# Patient Record
Sex: Female | Born: 1940
Health system: Southern US, Community
[De-identification: ages and names within clinical notes are randomized; demographics above are authoritative.]

## PROBLEM LIST (undated history)

## (undated) DIAGNOSIS — M8580 Other specified disorders of bone density and structure, unspecified site: Secondary | ICD-10-CM

## (undated) DIAGNOSIS — D239 Other benign neoplasm of skin, unspecified: Secondary | ICD-10-CM

## (undated) DIAGNOSIS — E039 Hypothyroidism, unspecified: Secondary | ICD-10-CM

## (undated) DIAGNOSIS — K635 Polyp of colon: Secondary | ICD-10-CM

## (undated) HISTORY — DX: Other specified disorders of bone density and structure, unspecified site: M85.80

## (undated) HISTORY — PX: OVARIAN CYST REMOVAL: SHX89

## (undated) HISTORY — DX: Other benign neoplasm of skin, unspecified: D23.9

## (undated) HISTORY — DX: Polyp of colon: K63.5

## (undated) HISTORY — DX: Hypothyroidism, unspecified: E03.9

---
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
## 1970-05-22 HISTORY — PX: ABDOMINAL HYSTERECTOMY: SHX81

## 1993-05-22 HISTORY — PX: CHOLECYSTECTOMY: SHX55

## 1997-11-12 ENCOUNTER — Ambulatory Visit (HOSPITAL_COMMUNITY): Admission: RE | Admit: 1997-11-12 | Discharge: 1997-11-12 | Payer: Self-pay | Admitting: Obstetrics and Gynecology

## 1998-08-17 ENCOUNTER — Inpatient Hospital Stay (HOSPITAL_COMMUNITY): Admission: AD | Admit: 1998-08-17 | Discharge: 1998-08-23 | Payer: Self-pay | Admitting: *Deleted

## 1999-01-28 ENCOUNTER — Ambulatory Visit (HOSPITAL_COMMUNITY): Admission: RE | Admit: 1999-01-28 | Discharge: 1999-01-28 | Payer: Self-pay | Admitting: Obstetrics and Gynecology

## 1999-01-28 ENCOUNTER — Encounter: Payer: Self-pay | Admitting: Obstetrics and Gynecology

## 1999-12-15 ENCOUNTER — Emergency Department (HOSPITAL_COMMUNITY): Admission: EM | Admit: 1999-12-15 | Discharge: 1999-12-15 | Payer: Self-pay | Admitting: Emergency Medicine

## 1999-12-15 ENCOUNTER — Encounter: Payer: Self-pay | Admitting: Gastroenterology

## 1999-12-15 ENCOUNTER — Encounter: Payer: Self-pay | Admitting: Emergency Medicine

## 1999-12-27 ENCOUNTER — Encounter: Payer: Self-pay | Admitting: Gastroenterology

## 1999-12-27 ENCOUNTER — Encounter: Admission: RE | Admit: 1999-12-27 | Discharge: 1999-12-27 | Payer: Self-pay | Admitting: Gastroenterology

## 2000-01-03 ENCOUNTER — Encounter: Admission: RE | Admit: 2000-01-03 | Discharge: 2000-01-03 | Payer: Self-pay | Admitting: Gastroenterology

## 2000-01-03 ENCOUNTER — Encounter: Payer: Self-pay | Admitting: Gastroenterology

## 2000-02-08 ENCOUNTER — Other Ambulatory Visit: Admission: RE | Admit: 2000-02-08 | Discharge: 2000-02-08 | Payer: Self-pay | Admitting: Obstetrics and Gynecology

## 2000-02-14 ENCOUNTER — Ambulatory Visit (HOSPITAL_COMMUNITY): Admission: RE | Admit: 2000-02-14 | Discharge: 2000-02-14 | Payer: Self-pay | Admitting: Gastroenterology

## 2000-03-22 ENCOUNTER — Encounter: Payer: Self-pay | Admitting: Obstetrics and Gynecology

## 2000-03-22 ENCOUNTER — Ambulatory Visit (HOSPITAL_COMMUNITY): Admission: RE | Admit: 2000-03-22 | Discharge: 2000-03-22 | Payer: Self-pay | Admitting: Obstetrics and Gynecology

## 2001-03-19 ENCOUNTER — Emergency Department (HOSPITAL_COMMUNITY): Admission: EM | Admit: 2001-03-19 | Discharge: 2001-03-19 | Payer: Self-pay

## 2004-03-24 ENCOUNTER — Ambulatory Visit: Payer: Self-pay | Admitting: Family Medicine

## 2004-06-17 ENCOUNTER — Ambulatory Visit: Payer: Self-pay | Admitting: Family Medicine

## 2005-03-21 ENCOUNTER — Ambulatory Visit: Payer: Self-pay | Admitting: Family Medicine

## 2005-03-28 ENCOUNTER — Ambulatory Visit: Payer: Self-pay | Admitting: Family Medicine

## 2006-05-17 ENCOUNTER — Ambulatory Visit: Payer: Self-pay | Admitting: Family Medicine

## 2006-05-17 LAB — CONVERTED CEMR LAB
ALT: 11 units/L (ref 0–40)
AST: 17 units/L (ref 0–37)
Basophils Relative: 1 % (ref 0.0–1.0)
Calcium: 8.9 mg/dL (ref 8.4–10.5)
Creatinine, Ser: 1 mg/dL (ref 0.4–1.2)
HDL: 63.9 mg/dL (ref >39.0)
LDL Cholesterol: 116 mg/dL — ABNORMAL HIGH (ref 0–99)
Monocytes Relative: 9.4 % (ref 3.0–11.0)
Platelets: 207 10*3/uL (ref 150–400)
RDW: 13.1 % (ref 11.5–14.6)
Triglyceride fasting, serum: 55 mg/dL (ref 0–149)
VLDL: 11 mg/dL (ref 0–40)

## 2007-06-04 ENCOUNTER — Ambulatory Visit: Payer: Self-pay | Admitting: Family Medicine

## 2007-06-04 DIAGNOSIS — E0590 Thyrotoxicosis, unspecified without thyrotoxic crisis or storm: Secondary | ICD-10-CM | POA: Insufficient documentation

## 2007-06-04 DIAGNOSIS — Z780 Asymptomatic menopausal state: Secondary | ICD-10-CM | POA: Insufficient documentation

## 2007-06-04 DIAGNOSIS — G43009 Migraine without aura, not intractable, without status migrainosus: Secondary | ICD-10-CM | POA: Insufficient documentation

## 2007-06-04 DIAGNOSIS — N951 Menopausal and female climacteric states: Secondary | ICD-10-CM | POA: Insufficient documentation

## 2007-06-04 LAB — CONVERTED CEMR LAB
AST: 19 units/L (ref 0–37)
Albumin: 3.8 g/dL (ref 3.5–5.2)
Alkaline Phosphatase: 38 units/L — ABNORMAL LOW (ref 39–117)
BUN: 10 mg/dL (ref 6–23)
Basophils Absolute: 0 10*3/uL (ref 0.0–0.1)
Basophils Relative: 0.7 % (ref 0.0–1.0)
Bilirubin, Direct: 0.1 mg/dL (ref 0.0–0.3)
Blood in Urine, dipstick: NEGATIVE
CO2: 32 meq/L (ref 19–32)
Calcium: 9.8 mg/dL (ref 8.4–10.5)
GFR calc Af Amer: 71 mL/min
GFR calc non Af Amer: 59 mL/min
HDL: 52.2 mg/dL (ref >39.0)
Ketones, urine, test strip: NEGATIVE
LDL Cholesterol: 108 mg/dL — ABNORMAL HIGH (ref 0–99)
Lymphocytes Relative: 30.6 % (ref 12.0–46.0)
MCHC: 35 g/dL (ref 30.0–36.0)
Monocytes Relative: 6.8 % (ref 3.0–11.0)
Neutro Abs: 4 10*3/uL (ref 1.4–7.7)
Nitrite: NEGATIVE
Platelets: 217 10*3/uL (ref 150–400)
Specific Gravity, Urine: 1.015
TSH: 5.14 microintl units/mL (ref 0.35–5.50)
Total Bilirubin: 0.8 mg/dL (ref 0.3–1.2)
Total Protein: 6.5 g/dL (ref 6.0–8.3)
Triglycerides: 77 mg/dL (ref 0–149)
Urobilinogen, UA: 0.2
VLDL: 15 mg/dL (ref 0–40)

## 2007-06-21 ENCOUNTER — Ambulatory Visit: Payer: Self-pay | Admitting: Family Medicine

## 2007-06-24 DIAGNOSIS — I781 Nevus, non-neoplastic: Secondary | ICD-10-CM | POA: Insufficient documentation

## 2007-07-16 ENCOUNTER — Encounter: Admission: RE | Admit: 2007-07-16 | Discharge: 2007-07-16 | Payer: Self-pay | Admitting: Family Medicine

## 2008-02-14 ENCOUNTER — Ambulatory Visit: Payer: Self-pay | Admitting: Family Medicine

## 2008-02-14 DIAGNOSIS — R141 Gas pain: Secondary | ICD-10-CM | POA: Insufficient documentation

## 2008-02-14 DIAGNOSIS — R142 Eructation: Secondary | ICD-10-CM

## 2008-02-14 DIAGNOSIS — R143 Flatulence: Secondary | ICD-10-CM

## 2008-02-15 ENCOUNTER — Encounter: Payer: Self-pay | Admitting: Family Medicine

## 2008-02-15 LAB — CONVERTED CEMR LAB
BUN: 12 mg/dL (ref 6–23)
Calcium: 10.1 mg/dL (ref 8.4–10.5)
Glucose, Bld: 88 mg/dL (ref 70–99)
Potassium: 4.5 meq/L (ref 3.5–5.3)
Sodium: 143 meq/L (ref 135–145)

## 2008-02-18 ENCOUNTER — Ambulatory Visit: Payer: Self-pay | Admitting: Cardiology

## 2008-02-20 ENCOUNTER — Ambulatory Visit: Payer: Self-pay | Admitting: Family Medicine

## 2008-06-11 ENCOUNTER — Ambulatory Visit: Payer: Self-pay | Admitting: Family Medicine

## 2008-06-12 ENCOUNTER — Encounter: Payer: Self-pay | Admitting: Family Medicine

## 2008-06-12 LAB — CONVERTED CEMR LAB
Alkaline Phosphatase: 41 units/L (ref 39–117)
Basophils Absolute: 0.1 10*3/uL (ref 0.0–0.1)
Bilirubin, Direct: 0.1 mg/dL (ref 0.0–0.3)
Calcium: 9.7 mg/dL (ref 8.4–10.5)
Cholesterol: 208 mg/dL (ref 0–200)
Eosinophils Absolute: 0.1 10*3/uL (ref 0.0–0.7)
GFR calc Af Amer: 107 mL/min
GFR calc non Af Amer: 88 mL/min
HCT: 37.3 % (ref 36.0–46.0)
Hemoglobin: 12.6 g/dL (ref 12.0–15.0)
MCHC: 33.9 g/dL (ref 30.0–36.0)
Monocytes Absolute: 0.5 10*3/uL (ref 0.1–1.0)
Monocytes Relative: 9.2 % (ref 3.0–12.0)
Neutro Abs: 2.4 10*3/uL (ref 1.4–7.7)
Platelets: 183 10*3/uL (ref 150–400)
Potassium: 3.7 meq/L (ref 3.5–5.1)
RDW: 13.2 % (ref 11.5–14.6)
Sodium: 141 meq/L (ref 135–145)
TSH: 7.73 microintl units/mL — ABNORMAL HIGH (ref 0.35–5.50)
Total Bilirubin: 0.8 mg/dL (ref 0.3–1.2)
Total CHOL/HDL Ratio: 3.7
Total Protein: 7.1 g/dL (ref 6.0–8.3)
Triglycerides: 93 mg/dL (ref 0–149)

## 2008-06-15 ENCOUNTER — Ambulatory Visit: Payer: Self-pay | Admitting: Family Medicine

## 2008-06-29 DIAGNOSIS — L820 Inflamed seborrheic keratosis: Secondary | ICD-10-CM | POA: Insufficient documentation

## 2008-06-29 DIAGNOSIS — L819 Disorder of pigmentation, unspecified: Secondary | ICD-10-CM | POA: Insufficient documentation

## 2008-07-16 ENCOUNTER — Encounter: Admission: RE | Admit: 2008-07-16 | Discharge: 2008-07-16 | Payer: Self-pay | Admitting: Family Medicine

## 2009-06-16 ENCOUNTER — Ambulatory Visit: Payer: Self-pay | Admitting: Family Medicine

## 2009-06-16 LAB — CONVERTED CEMR LAB
Bilirubin Urine: NEGATIVE
Blood in Urine, dipstick: NEGATIVE
Glucose, Urine, Semiquant: NEGATIVE
Ketones, urine, test strip: NEGATIVE
Protein, U semiquant: NEGATIVE
Specific Gravity, Urine: 1.01
WBC Urine, dipstick: NEGATIVE
pH: 6

## 2009-06-18 LAB — CONVERTED CEMR LAB
Albumin: 4.1 g/dL (ref 3.5–5.2)
BUN: 10 mg/dL (ref 6–23)
Basophils Absolute: 0.1 10*3/uL (ref 0.0–0.1)
CO2: 31 meq/L (ref 19–32)
Calcium: 9.8 mg/dL (ref 8.4–10.5)
Cholesterol: 199 mg/dL (ref 0–200)
Eosinophils Absolute: 0.2 10*3/uL (ref 0.0–0.7)
Glucose, Bld: 85 mg/dL (ref 70–99)
HCT: 39 % (ref 36.0–46.0)
HDL: 65.6 mg/dL (ref >39.00)
Hemoglobin: 13 g/dL (ref 12.0–15.0)
Lymphs Abs: 2.1 10*3/uL (ref 0.7–4.0)
MCHC: 33.4 g/dL (ref 30.0–36.0)
Neutro Abs: 2.4 10*3/uL (ref 1.4–7.7)
Platelets: 171 10*3/uL (ref 150.0–400.0)
RDW: 14.6 % (ref 11.5–14.6)
Sodium: 140 meq/L (ref 135–145)
TSH: 5.44 microintl units/mL (ref 0.35–5.50)

## 2009-07-01 ENCOUNTER — Ambulatory Visit: Payer: Self-pay | Admitting: Family Medicine

## 2009-07-02 DIAGNOSIS — L570 Actinic keratosis: Secondary | ICD-10-CM | POA: Insufficient documentation

## 2009-07-19 ENCOUNTER — Encounter: Admission: RE | Admit: 2009-07-19 | Discharge: 2009-07-19 | Payer: Self-pay | Admitting: Family Medicine

## 2009-07-19 LAB — HM MAMMOGRAPHY

## 2009-08-27 ENCOUNTER — Ambulatory Visit: Payer: Self-pay | Admitting: Family Medicine

## 2010-06-12 ENCOUNTER — Encounter: Payer: Self-pay | Admitting: Family Medicine

## 2010-06-17 ENCOUNTER — Other Ambulatory Visit: Payer: Self-pay | Admitting: Family Medicine

## 2010-06-17 DIAGNOSIS — Z1231 Encounter for screening mammogram for malignant neoplasm of breast: Secondary | ICD-10-CM

## 2010-06-17 DIAGNOSIS — Z1239 Encounter for other screening for malignant neoplasm of breast: Secondary | ICD-10-CM

## 2010-06-23 NOTE — Assessment & Plan Note (Signed)
Summary: mole removal/njr   Procedure Note Last Tetanus: Historical (03/28/2005)  Mole Biopsy/Removal: Indication: suspicious lesion  Procedure # 1: elliptical incision with 2 mm margin    Size (in cm): 0.6 x 0.6    Region: dorsal    Location: hand-dorsal-left    Instrument used: #15 blade    Anesthesia: 1% lidocaine w/epinephrine    Closure: cautery  Procedure # 2: elliptical incision with 2 mm margin    Size (in cm): 0.6 x 0.6    Region: dorsal    Location: hand-dorsal-left    Instrument used: #15 blade    Anesthesia: 1% lidocaine w/epinephrine    Closure: cautery  Cleaned and prepped with: sterile H20 Wound dressing: neosporin and bandaid   History of Present Illness: Margene is a 70 year old, married female, nonsmoker, who comes in today for evaluation of 3d irritated lesions on her hands. there are 2 on  her left and one on her right   the two lesions on her left hand were 6-mm Spot 6-mm's which were removed with Xylocaine with epi anesthesia.  The third lesion on her right hand 6-mm is by 6-mm.  This was also removed as noted above.  All 3 lesions were sent for pathologic analysis  Allergies: No Known Drug Allergies   Complete Medication List: 1)  Hydrochlorothiazide 25 Mg Tabs (Hydrochlorothiazide) .... Once daily 2)  Imitrex Statdose Refill 4 Mg/0.37ml Kit (Sumatriptan succinate) .... As needed migraine 3)  Calcium 500/d 500-200 Mg-unit Tabs (Calcium carbonate-vitamin d) .... Two once daily 4)  Multivitamins Tabs (Multiple vitamin) .... Once daily 5)  Vitamin C  .... Once daily 6)  Premarin 0.625 Mg/gm Crea (Estrogens, conjugated) .... Apply 2 xweek 7)  Synthroid 75 Mcg Tabs (Levothyroxine sodium) .... Take one tab by mouth once daily  Other Orders: Excise lesion (TAL) 0.6 - 1.0 (11401)  Patient Instructions: 1)  remove the Band-Aid tomorrow.  We will call you within two weeks with a report. 2)  Make your husband in appointment at [facility], your nose, and  throat for hearing evaluation.  Begin with Aurther Loft the audiologist

## 2010-06-23 NOTE — Assessment & Plan Note (Signed)
Summary: pt will come in fasting/njr   Vital Signs:  Patient profile:   70 year old female Menstrual status:  hysterectomy Height:      64.75 inches Weight:      138 pounds BMI:     23.23 Temp:     98.6 degrees F oral BP sitting:   110 / 76  (left arm) Cuff size:   regular  Vitals Entered By: Kern Reap CMA Duncan Dull) (June 16, 2009 10:23 AM)  Reason for Visit cpx  History of Present Illness: Teresa Patterson is a 70 year old, married female, nonsmoker, who comes in today for physical evaluation because of underlying hypothyroidism, migraine headaches, and is venous insufficiency with mild peripheral edema.  She takes Synthroid 75 micrograms daily.  Will check thyroid level today.  She also has post menopausal vaginal dryness for use for which she uses Premarin vaginal cream twice weekly.  She also uses Imitrex nasal spray for migraine headaches.  In the past 12, months.  She's had for migraines.  She also takes Mevacor, thiazide 25 mg daily for mild venous insufficiency.  She has two bumps on her left hand and one on her right hand that are red and irritated.  We will get her back for removal.  She is reaching I. care.  Dental care.  Colonoscopy done 2010 normal.  Tetanus 2006 Pneumovax 2007 seasonal flu today.  EKG done prior to colonoscopy in July.  Normal  Allergies (verified): No Known Drug Allergies  Past History:  Past medical, surgical, family and social histories (including risk factors) reviewed, and no changes noted (except as noted below).  Past Medical History: Reviewed history from 06/11/2008 and no changes required. childbirth x 3 gallbladder removal cyst removed right ovary uterus removed nonmalignant reasons.  Ovaries left intact Hypothyroidism migraine headaches  Family History: Reviewed history and no changes required.  Social History: Reviewed history from 06/04/2007 and no changes required. Occupation: [employer] Married Never Smoked Alcohol  use-no Drug use-no Regular exercise-no  Review of Systems      See HPI       Flu Vaccine Consent Questions     Do you have a history of severe allergic reactions to this vaccine? no    Any prior history of allergic reactions to egg and/or gelatin? no    Do you have a sensitivity to the preservative Thimersol? no    Do you have a past history of Guillan-Barre Syndrome? no    Do you currently have an acute febrile illness? no    Have you ever had a severe reaction to latex? no    Vaccine information given and explained to patient? yes    Are you currently pregnant? no    Lot Number:[id number]   Exp Date:11/18/2009   Site Given  right  Deltoid IM   Physical Exam  General:  Well-developed,well-nourished,in no acute distress; alert,appropriate and cooperative throughout examination Head:  Normocephalic and atraumatic without obvious abnormalities. No apparent alopecia or balding. Eyes:  No corneal or conjunctival inflammation noted. EOMI. Perrla. Funduscopic exam benign, without hemorrhages, exudates or papilledema. Vision grossly normal. Ears:  External ear exam shows no significant lesions or deformities.  Otoscopic examination reveals clear canals, tympanic membranes are intact bilaterally without bulging, retraction, inflammation or discharge. Hearing is grossly normal bilaterally. Nose:  External nasal examination shows no deformity or inflammation. Nasal mucosa are pink and moist without lesions or exudates. Mouth:  Oral mucosa and oropharynx without lesions or exudates.  Teeth in good repair. Neck:  No deformities, masses, or tenderness noted. Chest Wall:  No deformities, masses, or tenderness noted. Breasts:  No mass, nodules, thickening, tenderness, bulging, retraction, inflamation, nipple discharge or skin changes noted.   Lungs:  Normal respiratory effort, chest expands symmetrically. Lungs are clear to auscultation, no crackles or wheezes. Heart:  Normal rate and regular  rhythm. S1 and S2 normal without gallop, murmur, click, rub or other extra sounds. Abdomen:  Bowel sounds positive,abdomen soft and non-tender without masses, organomegaly or hernias noted. Rectal:  No external abnormalities noted. Normal sphincter tone. No rectal masses or tenderness. Genitalia:  Pelvic Exam:        External: normal female genitalia without lesions or masses        Vagina: normal without lesions or masses        Cervix: normal without lesions or masses        Adnexa: normal bimanual exam without masses or fullness        Uterus: normal by palpation        Pap smear: not performed Msk:  No deformity or scoliosis noted of thoracic or lumbar spine.   Pulses:  R and L carotid,radial,femoral,dorsalis pedis and posterior tibial pulses are full and equal bilaterally Extremities:  No clubbing, cyanosis, edema, or deformity noted with normal full range of motion of all joints.   Neurologic:  No cranial nerve deficits noted. Station and gait are normal. Plantar reflexes are down-going bilaterally. DTRs are symmetrical throughout. Sensory, motor and coordinative functions appear intact. Skin:  Intact without suspicious lesions or rashes Cervical Nodes:  No lymphadenopathy noted Axillary Nodes:  No palpable lymphadenopathy Inguinal Nodes:  No significant adenopathy Psych:  Cognition and judgment appear intact. Alert and cooperative with normal attention span and concentration. No apparent delusions, illusions, hallucinations   Impression & Recommendations:  Problem # 1:  POSTMENOPAUSAL WITHOUT HORMONE REPLACEMENT THERAPY (ICD-V49.81) Assessment Comment Only  Problem # 2:  COMMON MIGRAINE (ICD-346.10) Assessment: Improved  Her updated medication list for this problem includes:    Imitrex Statdose Refill 4 Mg/0.61ml Kit (Sumatriptan succinate) .Marland Kitchen... As needed migraine  Orders: Venipuncture (04540) TLB-Lipid Panel (80061-LIPID) TLB-BMP (Basic Metabolic Panel-BMET)  (80048-METABOL) TLB-CBC Platelet - w/Differential (85025-CBCD) TLB-Hepatic/Liver Function Pnl (80076-HEPATIC) TLB-TSH (Thyroid Stimulating Hormone) (98119-JYN) Prescription Created Electronically [id number]) UA Dipstick w/o Micro (automated)  (81003)  Problem # 3:  HYPOTHYROIDISM (ICD-244.9) Assessment: Improved  Her updated medication list for this problem includes:    Synthroid 75 Mcg Tabs (Levothyroxine sodium) .Marland Kitchen... Take one tab by mouth once daily  Orders: Venipuncture (21308) TLB-Lipid Panel (80061-LIPID) TLB-BMP (Basic Metabolic Panel-BMET) (80048-METABOL) TLB-CBC Platelet - w/Differential (85025-CBCD) TLB-Hepatic/Liver Function Pnl (80076-HEPATIC) TLB-TSH (Thyroid Stimulating Hormone) (65784-ONG) Prescription Created Electronically [id number]) UA Dipstick w/o Micro (automated)  (81003)  Complete Medication List: 1)  Hydrochlorothiazide 25 Mg Tabs (Hydrochlorothiazide) .... Once daily 2)  Imitrex Statdose Refill 4 Mg/0.46ml Kit (Sumatriptan succinate) .... As needed migraine 3)  Calcium 500/d 500-200 Mg-unit Tabs (Calcium carbonate-vitamin d) .... Two once daily 4)  Multivitamins Tabs (Multiple vitamin) .... Once daily 5)  Vitamin C  .... Once daily 6)  Premarin 0.625 Mg/gm Crea (Estrogens, conjugated) .... Apply 2 xweek 7)  Synthroid 75 Mcg Tabs (Levothyroxine sodium) .... Take one tab by mouth once daily  Other Orders: Admin 1st Vaccine (41324) Flu Vaccine 54yrs + (40102)  Patient Instructions: 1)  Please schedule a follow-up appointment in 1 year. 2)  It is important that you exercise regularly at least 20 minutes 5 times a week. If you  develop chest pain, have severe difficulty breathing, or feel very tired , stop exercising immediately and seek medical attention. 3)  Schedule your mammogram. 4)  Take calcium +Vitamin D daily. 5)  Take an Aspirin every day. Prescriptions: SYNTHROID 75 MCG TABS (LEVOTHYROXINE SODIUM) take one tab by mouth once daily  #100 x 4   Entered and  Authorized by:   Roderick Pee MD   Signed by:   Roderick Pee MD on 06/16/2009   Method used:   Print then Give to Patient   RxID:   [id number] PREMARIN 0.625 MG/GM CREA (ESTROGENS, CONJUGATED) apply 2 xweek  #3 tubes x 6   Entered and Authorized by:   Roderick Pee MD   Signed by:   Roderick Pee MD on 06/16/2009   Method used:   Print then Give to Patient   RxID:   [id number] XBMWUXL STATDOSE REFILL 4 MG/0.5ML  KIT (SUMATRIPTAN SUCCINATE) as needed migraine  #6 x 4   Entered and Authorized by:   Roderick Pee MD   Signed by:   Roderick Pee MD on 06/16/2009   Method used:   Print then Give to Patient   RxID:   [id number] HYDROCHLOROTHIAZIDE 25 MG  TABS (HYDROCHLOROTHIAZIDE) once daily  #100 x 4   Entered and Authorized by:   Roderick Pee MD   Signed by:   Roderick Pee MD on 06/16/2009   Method used:   Print then Give to Patient   RxID:   [id number]      Preventive Care Screening  Colonoscopy:    Date:  11/19/2008    Next Due:  06/2012    Results:  normal    Laboratory Results   Urine Tests    Routine Urinalysis   Color: yellow Appearance: Clear Glucose: negative   (Normal Range: Negative) Bilirubin: negative   (Normal Range: Negative) Ketone: negative   (Normal Range: Negative) Spec. Gravity: 1.010   (Normal Range: 1.003-1.035) Blood: negative   (Normal Range: Negative) pH: 6.0   (Normal Range: 5.0-8.0) Protein: negative   (Normal Range: Negative) Urobilinogen: 0.2   (Normal Range: 0-1) Nitrite: negative   (Normal Range: Negative) Leukocyte Esterace: negative   (Normal Range: Negative)    Comments: Rita Ohara  June 16, 2009 12:17 PM

## 2010-06-23 NOTE — Assessment & Plan Note (Signed)
Summary: CHECK HAND//CCM   Vital Signs:  Patient profile:   70 year old female Menstrual status:  hysterectomy Weight:      140 pounds Temp:     98.0 degrees F oral BP sitting:   104 / 70  (left arm) Cuff size:   regular  Vitals Entered By: Kern Reap CMA Duncan Dull) (August 27, 2009 9:19 AM) CC: hand check   CC:  hand check.  History of Present Illness: is a 70 year old female, who comes in today for recheck of moles removed from her hands.  She's had actinic keratosis removed x 3.  There are healing well.  Allergies: No Known Drug Allergies  Physical Exam  General:  Well-developed,well-nourished,in no acute distress; alert,appropriate and cooperative throughout examination Skin:  healing actinic keratoses   Impression & Recommendations:  Problem # 1:  ACTINIC KERATOSIS (ICD-702.0)  Orders: No Charge Patient Arrived (NCPA0) (NCPA0)  Complete Medication List: 1)  Hydrochlorothiazide 25 Mg Tabs (Hydrochlorothiazide) .... Once daily 2)  Imitrex Statdose Refill 4 Mg/0.46ml Kit (Sumatriptan succinate) .... As needed migraine 3)  Calcium 500/d 500-200 Mg-unit Tabs (Calcium carbonate-vitamin d) .... Two once daily 4)  Multivitamins Tabs (Multiple vitamin) .... Once daily 5)  Vitamin C  .... Once daily 6)  Premarin 0.625 Mg/gm Crea (Estrogens, conjugated) .... Apply 2 xweek 7)  Synthroid 75 Mcg Tabs (Levothyroxine sodium) .... Take one tab by mouth once daily  Patient Instructions: 1)  Please schedule a follow-up appointment as needed.

## 2010-07-08 ENCOUNTER — Other Ambulatory Visit: Payer: Self-pay | Admitting: Family Medicine

## 2010-07-08 DIAGNOSIS — Z1231 Encounter for screening mammogram for malignant neoplasm of breast: Secondary | ICD-10-CM

## 2010-07-12 ENCOUNTER — Other Ambulatory Visit: Payer: Self-pay | Admitting: Dermatology

## 2010-07-19 ENCOUNTER — Encounter: Payer: Self-pay | Admitting: Family Medicine

## 2010-07-19 ENCOUNTER — Ambulatory Visit (INDEPENDENT_AMBULATORY_CARE_PROVIDER_SITE_OTHER): Payer: PRIVATE HEALTH INSURANCE | Admitting: Family Medicine

## 2010-07-19 DIAGNOSIS — E039 Hypothyroidism, unspecified: Secondary | ICD-10-CM

## 2010-07-19 DIAGNOSIS — G43009 Migraine without aura, not intractable, without status migrainosus: Secondary | ICD-10-CM

## 2010-07-19 DIAGNOSIS — Z780 Asymptomatic menopausal state: Secondary | ICD-10-CM

## 2010-07-19 LAB — CBC WITH DIFFERENTIAL/PLATELET
Basophils Relative: 0.9 % (ref 0.0–3.0)
Eosinophils Relative: 1.5 % (ref 0.0–5.0)
HCT: 40.8 % (ref 36.0–46.0)
Lymphs Abs: 2.4 10*3/uL (ref 0.7–4.0)
MCV: 87.6 fl (ref 78.0–100.0)
Monocytes Absolute: 0.5 10*3/uL (ref 0.1–1.0)
Monocytes Relative: 8.3 % (ref 3.0–12.0)
Neutrophils Relative %: 50.9 % (ref 43.0–77.0)
Platelets: 178 10*3/uL (ref 150.0–400.0)
RBC: 4.65 Mil/uL (ref 3.87–5.11)
WBC: 6.4 10*3/uL (ref 4.5–10.5)

## 2010-07-19 LAB — BASIC METABOLIC PANEL
BUN: 15 mg/dL (ref 6–23)
Chloride: 98 mEq/L (ref 96–112)
GFR: 68.39 mL/min (ref >60.00)
Potassium: 4.1 mEq/L (ref 3.5–5.1)

## 2010-07-19 LAB — HEPATIC FUNCTION PANEL
ALT: 7 U/L (ref 0–35)
AST: 15 U/L (ref 0–37)
Bilirubin, Direct: 0.1 mg/dL (ref 0.0–0.3)
Total Bilirubin: 0.6 mg/dL (ref 0.3–1.2)
Total Protein: 6.7 g/dL (ref 6.0–8.3)

## 2010-07-19 LAB — POCT URINALYSIS DIPSTICK
Bilirubin, UA: NEGATIVE
Glucose, UA: NEGATIVE
Leukocytes, UA: NEGATIVE
Nitrite, UA: NEGATIVE
Urobilinogen, UA: 0.2

## 2010-07-19 LAB — LIPID PANEL
LDL Cholesterol: 100 mg/dL — ABNORMAL HIGH (ref 0–99)
Total CHOL/HDL Ratio: 3
Triglycerides: 95 mg/dL (ref 0.0–149.0)

## 2010-07-19 LAB — TSH: TSH: 6.05 u[IU]/mL — ABNORMAL HIGH (ref 0.35–5.50)

## 2010-07-19 MED ORDER — LEVOTHYROXINE SODIUM 75 MCG PO TABS: 75.0000 ug | ORAL_TABLET | Freq: Every day | ORAL | Status: DC

## 2010-07-19 MED ORDER — SUMATRIPTAN 5 MG/ACT NA SOLN: 1.0000 | NASAL | Status: DC | PRN

## 2010-07-19 MED ORDER — ESTROGENS, CONJUGATED 0.625 MG/GM VA CREA: TOPICAL_CREAM | Freq: Every day | VAGINAL | Status: DC

## 2010-07-19 MED ORDER — HYDROCHLOROTHIAZIDE 25 MG PO TABS: 25.0000 mg | ORAL_TABLET | Freq: Every day | ORAL | Status: DC

## 2010-07-19 NOTE — Progress Notes (Signed)
  Subjective:    Patient ID: Teresa Patterson, female    DOB: Jan 06, 1941, 70 y.o.   MRN: [id number]  HPI Teresa Patterson is a 70 year old female, married, nonsmoker, who comes in today For evaluation of hypothyroidism, postmenopausal vaginal dryness, migraine headaches, and general physical exam.  She takes Synthroid 75 mcg daily.  Will check TSH level.  She uses Premarin vaginal cream twice weekly for vaginal dryness.  Her migraines have decreased in severity.  She is not using Imitrex, except occasionally.  She also takes calcium, vitamin D, record, Dyazide, one a day, a multivitamin.  She gets routine eye care, dental care, BSE monthly, and new mammography, colonoscopy, normal, tetanus, 2006, Pneumovax 2007.   Review of Systems  Constitutional: Negative.   HENT: Negative.   Eyes: Negative.   Respiratory: Negative.   Cardiovascular: Negative.   Gastrointestinal: Negative.   Genitourinary: Negative.   Musculoskeletal: Negative.   Neurological: Negative.   Hematological: Negative.   Psychiatric/Behavioral: Negative.        Objective:   Physical Exam  Constitutional: She appears well-developed and well-nourished.  HENT:  Head: Normocephalic and atraumatic.  Right Ear: External ear normal.  Left Ear: External ear normal.  Nose: Nose normal.  Mouth/Throat: Oropharynx is clear and moist.  Eyes: EOM are normal. Pupils are equal, round, and reactive to light.  Neck: Normal range of motion. Neck supple. No thyromegaly present.  Cardiovascular: Normal rate, regular rhythm, normal heart sounds and intact distal pulses.  Exam reveals no gallop and no friction rub.   No murmur heard. Pulmonary/Chest: Effort normal and breath sounds normal.  Abdominal: Soft. Bowel sounds are normal. She exhibits no distension and no mass. There is no tenderness. There is no rebound.  Genitourinary: Vagina normal and uterus normal. Guaiac negative stool. No vaginal discharge found.       Breast exam normal    Musculoskeletal: Normal range of motion.  Lymphadenopathy:    She has no cervical adenopathy.  Neurological: She is alert. She has normal reflexes. No cranial nerve deficit. She exhibits normal muscle tone. Coordination normal.  Skin: Skin is warm and dry.       Recent lesion removed from her right arm by Dr. Yetta Barre the dermatologist.  Psychiatric: She has a normal mood and affect. Her behavior is normal. Judgment and thought content normal.          Assessment & Plan:  Hypothyroidism,,,,,,,,,, continue Synthroid.  ,Postmenopausal vaginal dryness,,,,, continue Premarin cream.  Migraine headaches.  Motrin, 600, p.r.n., Imitrex p.r.n.

## 2010-07-19 NOTE — Patient Instructions (Signed)
Continue current medications follow-up in one year 

## 2010-07-20 ENCOUNTER — Inpatient Hospital Stay (HOSPITAL_COMMUNITY): Admission: RE | Admit: 2010-07-20 | Payer: Self-pay | Source: Ambulatory Visit

## 2010-07-20 ENCOUNTER — Ambulatory Visit
Admission: RE | Admit: 2010-07-20 | Discharge: 2010-07-20 | Disposition: A | Discharge status: Home / Self Care | Payer: PRIVATE HEALTH INSURANCE | Source: Ambulatory Visit | Attending: Family Medicine | Admitting: Family Medicine

## 2010-07-20 DIAGNOSIS — Z1231 Encounter for screening mammogram for malignant neoplasm of breast: Secondary | ICD-10-CM

## 2010-10-07 NOTE — Procedures (Signed)
[facility]  Patient:    Teresa Patterson, Teresa Patterson                      MRN: [id number] Proc. Date: 02/14/00 Adm. Date:  [id number] Attending:  Deneen Harts CC:         Teena Irani. Arlyce Dice, M.D., [facility].   Procedure Report  PROCEDURE:  Colonoscopy.  INDICATION FOR PROCEDURE:  A 70 year old white female undergoing colonoscopy for neoplasia surveillance. In addition, she presented with acute chylous peritonitis. Prior neoplasia surveillance limited to sigmoidoscopy. Bowel habits regular. No hematochezia. Denies change in stool caliber.  DESCRIPTION OF PROCEDURE:  After reviewing the nature of the procedure with the patient including potential risks and complications, and after discussing alternative methods of diagnosis and treatment, informed consent was signed.  The patient was premedicated receiving Versed 5 mg, fentanyl 62.5 mcg administered IV in divided doses prior to and during the course of the procedure.  Using an Olympus pediatric PCF-140L video colonoscope, the rectum was intubated and the scope advanced around the entire length of the colon to the cecum. Digital examination revealed no evidence of perianal or intrarectal pathology. Intubation was difficult due to a rather large elongated tortuous colon. I think it would have been easier to have used a standard scope with decreased torque.  Preparation was excellent throughout. The scope was slowly withdrawn with careful inspection of the entire colon in a retrograde manner. Excellent visualization throughout except about the hepatic flexure which was acutely angulated. No abnormality was noted. There was no evidence of neoplasia, mucosal inflammation, diverticular disease or other finding. Retroflexed view in the vault was normal. The colon was decompressed, scope withdrawn.  The patient tolerated the procedure without difficulty being maintained on DataScope monitor and low flow  oxygen throughout. Returned to recovery in stable condition.  Time 2, technical 3, preparation 1, total score equal 6.  ASSESSMENT:  Normal colonoscopy evidence of colorectal neoplasia. No lesion to explain chylous ascites.  RECOMMENDATIONS: 1. Annual Hemoccult--[facility]. 2. Colonoscopy--repeat at 10 year intervals. On repeat use standard rather    than pediatric colonoscope. DD:  02/14/00 TD:  02/14/00 Job: [id number]

## 2011-03-30 ENCOUNTER — Ambulatory Visit (INDEPENDENT_AMBULATORY_CARE_PROVIDER_SITE_OTHER): Payer: PRIVATE HEALTH INSURANCE | Admitting: Family Medicine

## 2011-03-30 DIAGNOSIS — Z23 Encounter for immunization: Secondary | ICD-10-CM

## 2011-06-15 ENCOUNTER — Other Ambulatory Visit: Payer: Self-pay | Admitting: Family Medicine

## 2011-06-15 DIAGNOSIS — Z1231 Encounter for screening mammogram for malignant neoplasm of breast: Secondary | ICD-10-CM

## 2011-07-21 ENCOUNTER — Ambulatory Visit
Admission: RE | Admit: 2011-07-21 | Discharge: 2011-07-21 | Disposition: A | Discharge status: Home / Self Care | Payer: PRIVATE HEALTH INSURANCE | Source: Ambulatory Visit | Attending: Family Medicine | Admitting: Family Medicine

## 2011-07-21 DIAGNOSIS — Z1231 Encounter for screening mammogram for malignant neoplasm of breast: Secondary | ICD-10-CM

## 2011-08-15 ENCOUNTER — Ambulatory Visit (INDEPENDENT_AMBULATORY_CARE_PROVIDER_SITE_OTHER): Payer: PRIVATE HEALTH INSURANCE | Admitting: Family Medicine

## 2011-08-15 ENCOUNTER — Encounter: Payer: Self-pay | Admitting: Family Medicine

## 2011-08-15 VITALS — BP 112/80 | Temp 98.2°F | Ht 64.5 in | Wt 136.0 lb

## 2011-08-15 DIAGNOSIS — E039 Hypothyroidism, unspecified: Secondary | ICD-10-CM

## 2011-08-15 DIAGNOSIS — G43009 Migraine without aura, not intractable, without status migrainosus: Secondary | ICD-10-CM

## 2011-08-15 DIAGNOSIS — Z780 Asymptomatic menopausal state: Secondary | ICD-10-CM

## 2011-08-15 LAB — POCT URINALYSIS DIPSTICK
Glucose, UA: NEGATIVE
Nitrite, UA: NEGATIVE
Urobilinogen, UA: 0.2

## 2011-08-15 LAB — CBC WITH DIFFERENTIAL/PLATELET
Basophils Absolute: 0.1 10*3/uL (ref 0.0–0.1)
Basophils Relative: 0.9 % (ref 0.0–3.0)
Eosinophils Absolute: 0.1 10*3/uL (ref 0.0–0.7)
Lymphocytes Relative: 28.5 % (ref 12.0–46.0)
MCHC: 33.4 g/dL (ref 30.0–36.0)
Neutrophils Relative %: 62.3 % (ref 43.0–77.0)
RBC: 4.62 Mil/uL (ref 3.87–5.11)
RDW: 16.7 % — ABNORMAL HIGH (ref 11.5–14.6)

## 2011-08-15 LAB — BASIC METABOLIC PANEL
Calcium: 9.9 mg/dL (ref 8.4–10.5)
GFR: 77.34 mL/min (ref >60.00)
Sodium: 137 mEq/L (ref 135–145)

## 2011-08-15 LAB — TSH: TSH: 7.67 u[IU]/mL — ABNORMAL HIGH (ref 0.35–5.50)

## 2011-08-15 MED ORDER — ESTROGENS, CONJUGATED 0.625 MG/GM VA CREA: TOPICAL_CREAM | VAGINAL | Status: DC

## 2011-08-15 MED ORDER — HYDROCHLOROTHIAZIDE 25 MG PO TABS: 25.0000 mg | ORAL_TABLET | Freq: Every day | ORAL | Status: DC

## 2011-08-15 MED ORDER — LEVOTHYROXINE SODIUM 75 MCG PO TABS: 75.0000 ug | ORAL_TABLET | Freq: Every day | ORAL | Status: DC

## 2011-08-15 MED ORDER — SUMATRIPTAN 5 MG/ACT NA SOLN: 1.0000 | NASAL | Status: DC | PRN

## 2011-08-15 NOTE — Patient Instructions (Signed)
Continue your current medications  Remember to do a thorough breast exam monthly  Return in one year sooner if any problems  Strongly consider the shingles vaccine and call and leave a voicemail with Fleet Contras if you elect to do it here so she can order it for you

## 2011-08-15 NOTE — Progress Notes (Signed)
  Subjective:    Patient ID: Teresa Patterson, female    DOB: [birth year], 71 y.o.   MRN: [id number]  HPI Teresa Patterson is a 71 year old married female nonsmoker who comes in today for a Medicare wellness examination because of a history of postmenopausal vaginal dryness, fluid retention, hypothyroidism and migraine headaches  Her med list reviewed and intact.  She states she feels well and has had no problems. Her migraines have diminished in frequency and severity. She'll he had 3 last year.  She gets routine eye care, hearing normal, regular dental care, does not do BSE monthly, and you mammography, colonoscopy and GI 2010 normal, tetanus 2006, Pneumovax x2, seasonal flu shot 2012, information given on shingles  Cognitive function normal she walks on a daily basis home health safety reviewed no issues identified, no guns in the house, she does have a health care power of attorney and living well   Review of Systems  Constitutional: Negative.   HENT: Negative.   Eyes: Negative.   Respiratory: Negative.   Cardiovascular: Negative.   Gastrointestinal: Negative.   Genitourinary: Negative.   Musculoskeletal: Negative.   Neurological: Negative.   Hematological: Negative.   Psychiatric/Behavioral: Negative.        Objective:   Physical Exam  Constitutional: She appears well-developed and well-nourished.  HENT:  Head: Normocephalic and atraumatic.  Right Ear: External ear normal.  Left Ear: External ear normal.  Nose: Nose normal.  Mouth/Throat: Oropharynx is clear and moist.  Eyes: EOM are normal. Pupils are equal, round, and reactive to light.  Neck: Normal range of motion. Neck supple. No thyromegaly present.  Cardiovascular: Normal rate, regular rhythm, normal heart sounds and intact distal pulses.  Exam reveals no gallop and no friction rub.   No murmur heard. Pulmonary/Chest: Effort normal and breath sounds normal.  Abdominal: Soft. Bowel sounds are normal. She exhibits no  distension and no mass. There is no tenderness. There is no rebound.  Genitourinary: Vagina normal and uterus normal. Guaiac negative stool. No vaginal discharge found.       Bilateral breast exam normal  Musculoskeletal: Normal range of motion.  Lymphadenopathy:    She has no cervical adenopathy.  Neurological: She is alert. She has normal reflexes. No cranial nerve deficit. She exhibits normal muscle tone. Coordination normal.  Skin: Skin is warm and dry.  Psychiatric: She has a normal mood and affect. Her behavior is normal. Judgment and thought content normal.          Assessment & Plan:   healthy female  Peripheral edema continue hydrochlorothiazide one daily  Hypothyroidism continue Synthroid 75 mcg daily check TSH level  Migraine headaches Imitrex when necessary  Return in one year sooner if any problem

## 2011-08-23 ENCOUNTER — Other Ambulatory Visit: Payer: Self-pay | Admitting: *Deleted

## 2011-08-23 MED ORDER — LEVOTHYROXINE SODIUM 100 MCG PO TABS: 100.0000 ug | ORAL_TABLET | Freq: Every day | ORAL | Status: DC

## 2011-11-07 ENCOUNTER — Ambulatory Visit (INDEPENDENT_AMBULATORY_CARE_PROVIDER_SITE_OTHER): Payer: PRIVATE HEALTH INSURANCE | Admitting: Family Medicine

## 2011-11-07 ENCOUNTER — Encounter: Payer: Self-pay | Admitting: Family Medicine

## 2011-11-07 VITALS — BP 112/80 | Temp 98.1°F | Wt 133.0 lb

## 2011-11-07 DIAGNOSIS — E039 Hypothyroidism, unspecified: Secondary | ICD-10-CM

## 2011-11-07 NOTE — Patient Instructions (Signed)
I will call you with the report of your thyroid level.

## 2011-11-07 NOTE — Progress Notes (Signed)
  Subjective:    Patient ID: Teresa Patterson, female    DOB: August 24, 1940, 71 y.o.   MRN: [id number]  HPI Teresa Patterson is a 71 year old married female nonsmoker who comes in today for followup of hypothyroidism  Her TSH was elevated therefore we increased her Synthroid to 100 mcg daily. She's coming back today for followup. She states she doesn't feel any worse nor any better.   Review of Systems General and metabolic review of systems otherwise negative    Objective:   Physical Exam Well-developed well-nourished female in no acute distress Examination of the neck was normal      Assessment & Plan:  Hypothyroidism check TSH level

## 2012-06-28 ENCOUNTER — Other Ambulatory Visit: Payer: Self-pay | Admitting: Family Medicine

## 2012-06-28 DIAGNOSIS — Z1231 Encounter for screening mammogram for malignant neoplasm of breast: Secondary | ICD-10-CM

## 2012-07-22 ENCOUNTER — Ambulatory Visit
Admission: RE | Admit: 2012-07-22 | Discharge: 2012-07-22 | Disposition: A | Discharge status: Home / Self Care | Payer: Medicare Other | Source: Ambulatory Visit | Attending: Family Medicine | Admitting: Family Medicine

## 2012-07-22 DIAGNOSIS — Z1231 Encounter for screening mammogram for malignant neoplasm of breast: Secondary | ICD-10-CM

## 2012-08-07 ENCOUNTER — Other Ambulatory Visit: Payer: Self-pay | Admitting: Family Medicine

## 2012-08-15 ENCOUNTER — Encounter: Payer: PRIVATE HEALTH INSURANCE | Admitting: Family Medicine

## 2012-09-09 ENCOUNTER — Encounter: Payer: Self-pay | Admitting: Family Medicine

## 2012-10-02 ENCOUNTER — Ambulatory Visit (INDEPENDENT_AMBULATORY_CARE_PROVIDER_SITE_OTHER): Payer: Medicare Other | Admitting: Family Medicine

## 2012-10-02 ENCOUNTER — Encounter: Payer: Self-pay | Admitting: Family Medicine

## 2012-10-02 VITALS — BP 140/80 | Temp 98.4°F | Ht 64.5 in | Wt 138.0 lb

## 2012-10-02 DIAGNOSIS — Z0000 Encounter for general adult medical examination without abnormal findings: Secondary | ICD-10-CM

## 2012-10-02 DIAGNOSIS — L570 Actinic keratosis: Secondary | ICD-10-CM

## 2012-10-02 DIAGNOSIS — E039 Hypothyroidism, unspecified: Secondary | ICD-10-CM

## 2012-10-02 DIAGNOSIS — Z780 Asymptomatic menopausal state: Secondary | ICD-10-CM

## 2012-10-02 DIAGNOSIS — G43009 Migraine without aura, not intractable, without status migrainosus: Secondary | ICD-10-CM

## 2012-10-02 LAB — TSH: TSH: 2.26 u[IU]/mL (ref 0.35–5.50)

## 2012-10-02 LAB — POCT URINALYSIS DIPSTICK
Glucose, UA: NEGATIVE
Leukocytes, UA: NEGATIVE
Spec Grav, UA: 1.015
Urobilinogen, UA: 0.2

## 2012-10-02 LAB — BASIC METABOLIC PANEL
CO2: 28 mEq/L (ref 19–32)
Calcium: 9.5 mg/dL (ref 8.4–10.5)
GFR: 68.87 mL/min (ref >60.00)
Sodium: 139 mEq/L (ref 135–145)

## 2012-10-02 LAB — CBC WITH DIFFERENTIAL/PLATELET
Basophils Relative: 0.7 % (ref 0.0–3.0)
Eosinophils Absolute: 0.1 10*3/uL (ref 0.0–0.7)
Eosinophils Relative: 1.6 % (ref 0.0–5.0)
Lymphocytes Relative: 39 % (ref 12.0–46.0)
MCHC: 32.7 g/dL (ref 30.0–36.0)
Neutrophils Relative %: 47.3 % (ref 43.0–77.0)
RBC: 4.27 Mil/uL (ref 3.87–5.11)
WBC: 5.5 10*3/uL (ref 4.5–10.5)

## 2012-10-02 MED ORDER — HYDROCHLOROTHIAZIDE 25 MG PO TABS: ORAL_TABLET | ORAL | Status: DC

## 2012-10-02 MED ORDER — ESTROGENS, CONJUGATED 0.625 MG/GM VA CREA: TOPICAL_CREAM | VAGINAL | Status: DC

## 2012-10-02 MED ORDER — SUMATRIPTAN 5 MG/ACT NA SOLN: 1.0000 | NASAL | Status: DC | PRN

## 2012-10-02 MED ORDER — LEVOTHYROXINE SODIUM 100 MCG PO TABS: ORAL_TABLET | ORAL | Status: DC

## 2012-10-02 NOTE — Patient Instructions (Signed)
Continue your current medications  Stop the over-the-counter supplements except one baby aspirin daily  Continue daily exercise  Return next Tuesday at 12 noon for removal of the 2 lesions we discussed

## 2012-10-02 NOTE — Progress Notes (Signed)
  Subjective:    Patient ID: Teresa Patterson, female    DOB: Feb 14, 1941, 72 y.o.   MRN: [id number]  Teresa Patterson is a 72 year old married female nonsmoker who comes in today for a Medicare wellness examination because of a history of postmenopausal vaginal dryness, mild fluid retention, hypothyroidism, migraine headaches  Her medications reviewed the been no changes  She gets routine eye care, dental care, BSE monthly, and you mammography, colonoscopy up-to-date, vaccinations up-to-date except she's due for her shingles vaccine  Cognitive function normal she exercises on a daily basis home health safety reviewed no issues identified, no guns in the house, she does have a health care power of attorney and living well  She has 2 lesions one on her neck and one on her clavicle that are irritated  She's had a history of dysplastic nevi    Review of Systems  Constitutional: Negative.   HENT: Negative.   Eyes: Negative.   Respiratory: Negative.   Cardiovascular: Negative.   Gastrointestinal: Negative.   Genitourinary: Negative.   Musculoskeletal: Negative.   Neurological: Negative.   Psychiatric/Behavioral: Negative.        Objective:   Physical Exam  Constitutional: She appears well-developed and well-nourished.  HENT:  Head: Normocephalic and atraumatic.  Right Ear: External ear normal.  Left Ear: External ear normal.  Nose: Nose normal.  Mouth/Throat: Oropharynx is clear and moist.  Eyes: EOM are normal. Pupils are equal, round, and reactive to light.  Neck: Normal range of motion. Neck supple. No thyromegaly present.  Cardiovascular: Normal rate, regular rhythm, normal heart sounds and intact distal pulses.  Exam reveals no gallop and no friction rub.   No murmur heard. Pulmonary/Chest: Effort normal and breath sounds normal.  Abdominal: Soft. Bowel sounds are normal. She exhibits no distension and no mass. There is no tenderness. There is no rebound.  Genitourinary: Vagina  normal. Guaiac negative stool. No vaginal discharge found.  Bilateral breast exam normal  Musculoskeletal: Normal range of motion.  Lymphadenopathy:    She has no cervical adenopathy.  Neurological: She is alert. She has normal reflexes. No cranial nerve deficit. She exhibits normal muscle tone. Coordination normal.  Skin: Skin is warm and dry.  Total body skin exam normal except for a lesion on her left neck and one in the midline,,,,,,, advised to return for removal next Tuesday around noontime  Psychiatric: She has a normal mood and affect. Her behavior is normal. Judgment and thought content normal.          Assessment & Plan:  Healthy female  Postmenopausal vaginal dryness continue Premarin twice weekly  Fluid retention continue hydrochlorothiazide 25 mg daily  Hypothyroidism continue Synthroid 100 mcg daily check labs  Migraine headaches continue Imitrex when necessary

## 2012-10-08 ENCOUNTER — Ambulatory Visit (INDEPENDENT_AMBULATORY_CARE_PROVIDER_SITE_OTHER): Payer: Medicare Other | Admitting: Family Medicine

## 2012-10-08 ENCOUNTER — Encounter: Payer: Self-pay | Admitting: Family Medicine

## 2012-10-08 DIAGNOSIS — D234 Other benign neoplasm of skin of scalp and neck: Secondary | ICD-10-CM

## 2012-10-08 DIAGNOSIS — L989 Disorder of the skin and subcutaneous tissue, unspecified: Secondary | ICD-10-CM

## 2012-10-08 DIAGNOSIS — D235 Other benign neoplasm of skin of trunk: Secondary | ICD-10-CM | POA: Insufficient documentation

## 2012-10-08 NOTE — Progress Notes (Signed)
  Subjective:    Patient ID: Teresa Patterson, female    DOB: Oct 26, 1940, 72 y.o.   MRN: [id number]  HPI Teresa Patterson is a 72 year old female nonsmoker who comes in today for removal of 2 lesions  She's had a history of dysplastic nevi  Lesion #1 is a 6 mm x6 mm lesion midline neck over the larynx  Lesion #2 is 8 mm x8 mm left upper anterior chest wall  After informed consent both lesions were anesthetized with 1% Xylocaine after an alcohol prep. The lesions were excised with 2 mm margins and sent for pathologic analysis. The bases were cauterized Band-Aids were applied she tolerated the procedure no complications   Review of Systems    review of systems negative Objective:   Physical Exam  Procedure see above      Assessment & Plan:  Dysplastic nevus a x2 clinically  Pending

## 2012-10-08 NOTE — Patient Instructions (Signed)
Return when necessary  Within 2 weeks we will call you the report 

## 2012-12-20 ENCOUNTER — Encounter (HOSPITAL_BASED_OUTPATIENT_CLINIC_OR_DEPARTMENT_OTHER): Payer: Self-pay | Admitting: *Deleted

## 2012-12-20 ENCOUNTER — Emergency Department (HOSPITAL_BASED_OUTPATIENT_CLINIC_OR_DEPARTMENT_OTHER)
Admission: EM | Admit: 2012-12-20 | Discharge: 2012-12-20 | Disposition: A | Discharge status: Home / Self Care | Payer: Medicare Other | Attending: Emergency Medicine | Admitting: Emergency Medicine

## 2012-12-20 DIAGNOSIS — H9210 Otorrhea, unspecified ear: Secondary | ICD-10-CM | POA: Insufficient documentation

## 2012-12-20 DIAGNOSIS — Z79899 Other long term (current) drug therapy: Secondary | ICD-10-CM | POA: Insufficient documentation

## 2012-12-20 DIAGNOSIS — X58XXXA Exposure to other specified factors, initial encounter: Secondary | ICD-10-CM | POA: Insufficient documentation

## 2012-12-20 DIAGNOSIS — Y939 Activity, unspecified: Secondary | ICD-10-CM | POA: Insufficient documentation

## 2012-12-20 DIAGNOSIS — E039 Hypothyroidism, unspecified: Secondary | ICD-10-CM | POA: Insufficient documentation

## 2012-12-20 DIAGNOSIS — IMO0002 Reserved for concepts with insufficient information to code with codable children: Secondary | ICD-10-CM | POA: Insufficient documentation

## 2012-12-20 DIAGNOSIS — Y929 Unspecified place or not applicable: Secondary | ICD-10-CM | POA: Insufficient documentation

## 2012-12-20 DIAGNOSIS — S00412A Abrasion of left ear, initial encounter: Secondary | ICD-10-CM

## 2012-12-20 DIAGNOSIS — G43909 Migraine, unspecified, not intractable, without status migrainosus: Secondary | ICD-10-CM | POA: Insufficient documentation

## 2012-12-20 NOTE — ED Notes (Signed)
Sudden onset of blood from her left ear. Denies injury. States she has had nothing out of the ordinary happen to her in recent days. Bright red blood actively oozing from her ear.

## 2012-12-20 NOTE — ED Provider Notes (Signed)
CSN: [id number]     Arrival date & time 12/20/12  1832 History     First MD Initiated Contact with Patient 12/20/12 1933     Chief Complaint  Patient presents with  . Ear Injury   (Consider location/radiation/quality/duration/timing/severity/associated sxs/prior Treatment) Patient is a 72 y.o. female presenting with ear drainage. The history is provided by the patient. No language interpreter was used.  Ear Drainage This is a new problem. The current episode started today. The problem occurs constantly. Nothing aggravates the symptoms. She has tried nothing for the symptoms. The treatment provided moderate relief.  Pt complains of blood oozing from her left ear.  Pt reports she thought she had rain on her neck and noticed blood.  No injury  Past Medical History  Diagnosis Date  . Thyroid disease     hypothyroid  . Migraine    Past Surgical History  Procedure Laterality Date  . Cholecystectomy    . Ovarian cyst removal      right  . Abdominal hysterectomy     No family history on file. History  Substance Use Topics  . Smoking status: Never Smoker   . Smokeless tobacco: Not on file  . Alcohol Use: No   OB History   Grav Para Term Preterm Abortions TAB SAB Ect Mult Living                 Review of Systems  HENT: Positive for ear discharge. Negative for ear pain.   All other systems reviewed and are negative.    Allergies  Review of patient's allergies indicates no known allergies.  Home Medications   Current Outpatient Rx  Name  Route  Sig  Dispense  Refill  . Ascorbic Acid (VITAMIN C) 100 MG tablet   Oral   Take 100 mg by mouth daily.           . calcium citrate-vitamin D (CITRACAL+D) 315-200 MG-UNIT per tablet   Oral   Take 1 tablet by mouth 2 (two) times daily.           Marland Kitchen conjugated estrogens (PREMARIN) vaginal cream      Apply small amounts twice weekly   42.5 g   11   . hydrochlorothiazide (HYDRODIURIL) 25 MG tablet      Take 1 tablet by  mouth  daily   100 tablet   3   . levothyroxine (SYNTHROID, LEVOTHROID) 100 MCG tablet      Take 1 tablet by mouth  daily   100 tablet   3   . Multiple Vitamin (MULTIVITAMIN) tablet   Oral   Take 1 tablet by mouth daily.           . SUMAtriptan (IMITREX) 5 MG/ACT nasal spray   Nasal   Place 1 spray (5 mg total) into the nose every 2 (two) hours as needed.   6 Inhaler   3    BP 149/83  Pulse 61  Temp(Src) 98.5 F (36.9 C) (Oral)  Resp 20  Wt 138 lb (62.596 kg)  BMI 23.33 kg/m2  SpO2 100% Physical Exam  Nursing note and vitals reviewed. Constitutional: She is oriented to person, place, and time. She appears well-developed and well-nourished.  HENT:  Head: Normocephalic and atraumatic.  Abrasion lower ear canal,   continous oozing  Eyes: Conjunctivae and EOM are normal. Pupils are equal, round, and reactive to light.  Neck: Normal range of motion.  Cardiovascular: Normal rate.   Abdominal: Soft.  Musculoskeletal: Normal  range of motion.  Neurological: She is alert and oriented to person, place, and time. She has normal reflexes.  Skin: Skin is warm.  Psychiatric: She has a normal mood and affect.    ED Course   Procedures (including critical care time)  Labs Reviewed - No data to display No results found. 1. Abrasion of ear canal, left, initial encounter     MDM  I placed quick clot on a ear wick and placed at bleeding site.   Pt observed x20 minutes.    Pt advised to remove ear wick in 48 hours.  Lonia Skinner [place], PA-C 12/20/12 2110

## 2012-12-21 NOTE — ED Provider Notes (Signed)
Medical screening examination/treatment/procedure(s) were performed by non-physician practitioner and as supervising physician I was immediately available for consultation/collaboration.   Teresa Patterson. Sweta Halseth, MD 12/21/12 1058

## 2013-02-14 ENCOUNTER — Ambulatory Visit (INDEPENDENT_AMBULATORY_CARE_PROVIDER_SITE_OTHER): Payer: Medicare Other

## 2013-02-14 DIAGNOSIS — Z23 Encounter for immunization: Secondary | ICD-10-CM

## 2013-06-03 ENCOUNTER — Other Ambulatory Visit: Payer: Self-pay | Admitting: *Deleted

## 2013-06-03 DIAGNOSIS — E039 Hypothyroidism, unspecified: Secondary | ICD-10-CM

## 2013-06-03 DIAGNOSIS — Z0000 Encounter for general adult medical examination without abnormal findings: Secondary | ICD-10-CM

## 2013-06-03 MED ORDER — HYDROCHLOROTHIAZIDE 25 MG PO TABS: ORAL_TABLET | ORAL | Status: DC

## 2013-06-03 MED ORDER — LEVOTHYROXINE SODIUM 100 MCG PO TABS: ORAL_TABLET | ORAL | Status: DC

## 2013-06-30 ENCOUNTER — Other Ambulatory Visit: Payer: Self-pay

## 2013-06-30 DIAGNOSIS — Z1231 Encounter for screening mammogram for malignant neoplasm of breast: Secondary | ICD-10-CM

## 2013-07-24 ENCOUNTER — Ambulatory Visit
Admission: RE | Admit: 2013-07-24 | Discharge: 2013-07-24 | Disposition: A | Discharge status: Home / Self Care | Payer: Medicare HMO | Source: Ambulatory Visit

## 2013-07-24 DIAGNOSIS — Z1231 Encounter for screening mammogram for malignant neoplasm of breast: Secondary | ICD-10-CM

## 2013-10-22 ENCOUNTER — Ambulatory Visit (INDEPENDENT_AMBULATORY_CARE_PROVIDER_SITE_OTHER): Payer: Medicare HMO | Admitting: Family Medicine

## 2013-10-22 ENCOUNTER — Encounter: Payer: Self-pay | Admitting: Family Medicine

## 2013-10-22 VITALS — BP 110/70 | Temp 97.6°F | Ht 64.0 in | Wt 136.0 lb

## 2013-10-22 DIAGNOSIS — Z0000 Encounter for general adult medical examination without abnormal findings: Secondary | ICD-10-CM

## 2013-10-22 DIAGNOSIS — E039 Hypothyroidism, unspecified: Secondary | ICD-10-CM

## 2013-10-22 DIAGNOSIS — G43009 Migraine without aura, not intractable, without status migrainosus: Secondary | ICD-10-CM

## 2013-10-22 DIAGNOSIS — Z780 Asymptomatic menopausal state: Secondary | ICD-10-CM

## 2013-10-22 DIAGNOSIS — Z23 Encounter for immunization: Secondary | ICD-10-CM

## 2013-10-22 LAB — BASIC METABOLIC PANEL
BUN: 16 mg/dL (ref 6–23)
CALCIUM: 9.7 mg/dL (ref 8.4–10.5)
CO2: 30 meq/L (ref 19–32)
CREATININE: 0.9 mg/dL (ref 0.4–1.2)
Chloride: 103 mEq/L (ref 96–112)
GFR: 66.87 mL/min (ref >60.00)
Glucose, Bld: 75 mg/dL (ref 70–99)
Potassium: 3.5 mEq/L (ref 3.5–5.1)
Sodium: 138 mEq/L (ref 135–145)

## 2013-10-22 LAB — CBC WITH DIFFERENTIAL/PLATELET
BASOS ABS: 0.1 10*3/uL (ref 0.0–0.1)
Basophils Relative: 1 % (ref 0.0–3.0)
EOS ABS: 0.2 10*3/uL (ref 0.0–0.7)
Eosinophils Relative: 4.3 % (ref 0.0–5.0)
HEMATOCRIT: 40 % (ref 36.0–46.0)
Hemoglobin: 13.1 g/dL (ref 12.0–15.0)
LYMPHS ABS: 1.8 10*3/uL (ref 0.7–4.0)
Lymphocytes Relative: 33.6 % (ref 12.0–46.0)
MCHC: 32.9 g/dL (ref 30.0–36.0)
MCV: 85.7 fl (ref 78.0–100.0)
MONO ABS: 0.5 10*3/uL (ref 0.1–1.0)
Monocytes Relative: 10.2 % (ref 3.0–12.0)
Neutro Abs: 2.7 10*3/uL (ref 1.4–7.7)
Neutrophils Relative %: 50.9 % (ref 43.0–77.0)
PLATELETS: 184 10*3/uL (ref 150.0–400.0)
RBC: 4.67 Mil/uL (ref 3.87–5.11)
RDW: 16.5 % — AB (ref 11.5–15.5)
WBC: 5.4 10*3/uL (ref 4.0–10.5)

## 2013-10-22 LAB — TSH: TSH: 1.4 u[IU]/mL (ref 0.35–4.50)

## 2013-10-22 MED ORDER — ESTROGENS, CONJUGATED 0.625 MG/GM VA CREA: TOPICAL_CREAM | VAGINAL | Status: DC

## 2013-10-22 MED ORDER — LEVOTHYROXINE SODIUM 100 MCG PO TABS: ORAL_TABLET | ORAL | Status: DC

## 2013-10-22 MED ORDER — HYDROCHLOROTHIAZIDE 25 MG PO TABS: ORAL_TABLET | ORAL | Status: DC

## 2013-10-22 NOTE — Patient Instructions (Signed)
Continue your current medications  Return in one year for general physical examination sooner if any problems  Labs today........Marland Kitchen we will call you the report

## 2013-10-22 NOTE — Progress Notes (Signed)
   Subjective:    Patient ID: Teresa Patterson, female    DOB: 08-Apr-1941, 73 y.o.   MRN: [id number]  HPI Months is a 73 year old female who comes in today for evaluation of postmenopausal vaginal dryness, mild fluid retention, migraine headaches and hypothyroidism  She says overall she feels well and has no complaints. Her migraines have virtually disappeared Lollie Marrow had one last year  She gets routine eye care, dental care, BSE monthly, and you mammography, colonoscopy 2011 normal  Vaccinations updated by Apolonio Schneiders  Cognitive function normal she walks on a regular basis home health safety reviewed no issues identified, no guns in the house, she does have a health care power of attorney and living well   Review of Systems  Constitutional: Negative.   HENT: Negative.   Eyes: Negative.   Respiratory: Negative.   Cardiovascular: Negative.   Gastrointestinal: Negative.   Genitourinary: Negative.   Musculoskeletal: Negative.   Neurological: Negative.   Psychiatric/Behavioral: Negative.        Objective:   Physical Exam  Nursing note and vitals reviewed. Constitutional: She appears well-developed and well-nourished.  HENT:  Head: Normocephalic and atraumatic.  Right Ear: External ear normal.  Left Ear: External ear normal.  Nose: Nose normal.  Mouth/Throat: Oropharynx is clear and moist.  Eyes: EOM are normal. Pupils are equal, round, and reactive to light.  Neck: Normal range of motion. Neck supple. No thyromegaly present.  Cardiovascular: Normal rate, regular rhythm, normal heart sounds and intact distal pulses.  Exam reveals no gallop and no friction rub.   No murmur heard. No carotid neurologic bruits peripheral pulses 2+ and symmetrical  Pulmonary/Chest: Effort normal and breath sounds normal.  Abdominal: Soft. Bowel sounds are normal. She exhibits no distension and no mass. There is no tenderness. There is no rebound.  Genitourinary:  Bilateral breast exam normal  Pelvic  not indicated asymptomatic  Musculoskeletal: Normal range of motion.  Lymphadenopathy:    She has no cervical adenopathy.  Neurological: She is alert. She has normal reflexes. No cranial nerve deficit. She exhibits normal muscle tone. Coordination normal.  Skin: Skin is warm and dry.  Total body skin exam normal she has a garden variety of freckles moles and seborrheic keratoses  Psychiatric: She has a normal mood and affect. Her behavior is normal. Judgment and thought content normal.          Assessment & Plan:  Healthy female  Hypothyroidism continue Synthroid  Migraine headaches Imitrex when necessary  Fluid retention hydrochlorothiazide daily  Postmenopausal vaginal dryness Premarin cream twice weekly

## 2014-01-08 ENCOUNTER — Ambulatory Visit (INDEPENDENT_AMBULATORY_CARE_PROVIDER_SITE_OTHER): Payer: Medicare HMO | Admitting: *Deleted

## 2014-01-08 DIAGNOSIS — Z23 Encounter for immunization: Secondary | ICD-10-CM

## 2014-06-15 ENCOUNTER — Telehealth: Payer: Self-pay | Admitting: *Deleted

## 2014-06-15 NOTE — Telephone Encounter (Signed)
Patient will get further refills at [facility] due to insurance.

## 2014-07-14 ENCOUNTER — Other Ambulatory Visit: Payer: Self-pay

## 2014-07-14 DIAGNOSIS — Z1231 Encounter for screening mammogram for malignant neoplasm of breast: Secondary | ICD-10-CM

## 2014-07-27 ENCOUNTER — Other Ambulatory Visit: Payer: Self-pay | Admitting: Family Medicine

## 2014-07-27 DIAGNOSIS — Z0000 Encounter for general adult medical examination without abnormal findings: Secondary | ICD-10-CM

## 2014-07-27 DIAGNOSIS — E039 Hypothyroidism, unspecified: Secondary | ICD-10-CM

## 2014-07-27 MED ORDER — LEVOTHYROXINE SODIUM 100 MCG PO TABS
ORAL_TABLET | ORAL | Status: DC
Start: 2014-07-27 — End: 2014-11-10

## 2014-07-27 MED ORDER — HYDROCHLOROTHIAZIDE 25 MG PO TABS
ORAL_TABLET | ORAL | Status: DC
Start: 2014-07-27 — End: 2014-11-10

## 2014-07-27 NOTE — Telephone Encounter (Signed)
rx sent in electronically 

## 2014-07-27 NOTE — Telephone Encounter (Signed)
Pt request refill  hydrochlorothiazide (HYDRODIURIL) 25 MG tablet levothyroxine (SYNTHROID, LEVOTHROID) 100 MCG tablet [facility] on [address] (pls note new pharm)

## 2014-07-28 ENCOUNTER — Encounter (INDEPENDENT_AMBULATORY_CARE_PROVIDER_SITE_OTHER): Payer: Self-pay

## 2014-07-28 ENCOUNTER — Ambulatory Visit
Admission: RE | Admit: 2014-07-28 | Discharge: 2014-07-28 | Disposition: A | Discharge status: Home / Self Care | Payer: Medicare HMO | Source: Ambulatory Visit

## 2014-07-28 DIAGNOSIS — Z1231 Encounter for screening mammogram for malignant neoplasm of breast: Secondary | ICD-10-CM

## 2014-10-22 ENCOUNTER — Telehealth: Payer: Self-pay | Admitting: Family Medicine

## 2014-10-22 DIAGNOSIS — G43009 Migraine without aura, not intractable, without status migrainosus: Secondary | ICD-10-CM

## 2014-10-22 MED ORDER — SUMATRIPTAN 5 MG/ACT NA SOLN: 1.0000 | NASAL | Status: DC | PRN

## 2014-10-22 NOTE — Telephone Encounter (Signed)
Patient is requesting re-fill on SUMAtriptan (IMITREX) 5 MG/ACT nasal spray sent to [facility] [place], [place] - [address]Madaline Brilliant to leave message on vm if needed.

## 2014-11-10 ENCOUNTER — Other Ambulatory Visit: Payer: Self-pay | Admitting: Family Medicine

## 2015-02-08 ENCOUNTER — Ambulatory Visit (INDEPENDENT_AMBULATORY_CARE_PROVIDER_SITE_OTHER): Payer: Medicare HMO | Admitting: Family Medicine

## 2015-02-08 ENCOUNTER — Encounter: Payer: Self-pay | Admitting: Family Medicine

## 2015-02-08 VITALS — BP 120/84 | Temp 98.5°F | Ht 64.0 in | Wt 130.0 lb

## 2015-02-08 DIAGNOSIS — Z23 Encounter for immunization: Secondary | ICD-10-CM | POA: Diagnosis not present

## 2015-02-08 DIAGNOSIS — Z0000 Encounter for general adult medical examination without abnormal findings: Secondary | ICD-10-CM | POA: Diagnosis not present

## 2015-02-08 DIAGNOSIS — Z780 Asymptomatic menopausal state: Secondary | ICD-10-CM | POA: Diagnosis not present

## 2015-02-08 DIAGNOSIS — G43009 Migraine without aura, not intractable, without status migrainosus: Secondary | ICD-10-CM | POA: Diagnosis not present

## 2015-02-08 DIAGNOSIS — E0590 Thyrotoxicosis, unspecified without thyrotoxic crisis or storm: Secondary | ICD-10-CM | POA: Diagnosis not present

## 2015-02-08 LAB — POCT URINALYSIS DIPSTICK
Bilirubin, UA: NEGATIVE
Blood, UA: NEGATIVE
Glucose, UA: NEGATIVE
KETONES UA: NEGATIVE
Leukocytes, UA: NEGATIVE
Nitrite, UA: NEGATIVE
Protein, UA: NEGATIVE
Spec Grav, UA: 1.01
Urobilinogen, UA: 0.2
pH, UA: 6.5

## 2015-02-08 MED ORDER — ESTROGENS, CONJUGATED 0.625 MG/GM VA CREA: TOPICAL_CREAM | VAGINAL | Status: DC

## 2015-02-08 MED ORDER — HYDROCHLOROTHIAZIDE 25 MG PO TABS: 25.0000 mg | ORAL_TABLET | Freq: Every day | ORAL | Status: DC

## 2015-02-08 MED ORDER — LEVOTHYROXINE SODIUM 100 MCG PO TABS: 100.0000 ug | ORAL_TABLET | Freq: Every day | ORAL | Status: DC

## 2015-02-08 NOTE — Progress Notes (Signed)
   Subjective:    Patient ID: Teresa Patterson, female    DOB: 12-30-1940, 74 y.o.   MRN: [id number]  HPI Teresa Patterson is a 74 year old widowed female nonsmoker,,,,,,,, husband died in September 12, 2022 of lung cancer,,,,,,, who comes in today for general physical examination  She has a history of migraine headaches however her migraines are very mild she's only had to take 4 doses of the Imitrex in the last 12 months.  She sees a dermatologist on a regular basis because of a history of dysplastic nevi. She said she was seen there recently and had a number of lesions removed. Path report pending.  She's also had a history of a ovarian mass that was removed and then she had her uterus removed for nonmalignant reasons. The left ovary is intact. She's been using small amounts of hormonal cream for vaginal dryness.  She gets routine eye care, dental care, BSE monthly, annual mammography, vaccinations updated flu shot given today  Cognitive function normal she exercises on a regular basis home health safety reviewed no issues identified, no guns in the house, she does have a healthcare power of attorney and living well.  She also takes Synthroid 100 g daily for hypothyroidism  She also takes Hydrocort thiazide 25 mg daily for fluid retention   Review of Systems  Constitutional: Negative.   HENT: Negative.   Eyes: Negative.   Respiratory: Negative.   Cardiovascular: Negative.   Gastrointestinal: Negative.   Endocrine: Negative.   Genitourinary: Negative.   Musculoskeletal: Negative.   Skin: Negative.   Allergic/Immunologic: Negative.   Neurological: Negative.   Hematological: Negative.   Psychiatric/Behavioral: Negative.        Objective:   Physical Exam  Constitutional: She appears well-developed and well-nourished.  HENT:  Head: Normocephalic and atraumatic.  Right Ear: External ear normal.  Left Ear: External ear normal.  Nose: Nose normal.  Mouth/Throat: Oropharynx is clear and moist.    Eyes: EOM are normal. Pupils are equal, round, and reactive to light.  Neck: Normal range of motion. Neck supple. No JVD present. No tracheal deviation present. No thyromegaly present.  Cardiovascular: Normal rate, regular rhythm, normal heart sounds and intact distal pulses.  Exam reveals no gallop and no friction rub.   No murmur heard. Pulmonary/Chest: Effort normal and breath sounds normal. No stridor. No respiratory distress. She has no wheezes. She has no rales. She exhibits no tenderness.  Abdominal: Soft. Bowel sounds are normal. She exhibits no distension and no mass. There is no tenderness. There is no rebound and no guarding.  Genitourinary:  Bilateral breast exam normal  She had her uterus and her right ovary removed for nonmalignant reasons therefore pelvics and Paps not indicated  Musculoskeletal: Normal range of motion.  Lymphadenopathy:    She has no cervical adenopathy.  Neurological: She is alert. She has normal reflexes. No cranial nerve deficit. She exhibits normal muscle tone. Coordination normal.  Skin: Skin is warm and dry. No rash noted. No erythema. No pallor.  Total body skin exam normal except for scars from previous mole removal  Psychiatric: She has a normal mood and affect. Her behavior is normal. Judgment and thought content normal.  Nursing note and vitals reviewed.         Assessment & Plan:  Healthy female  Postmenopausal with vaginal dryness.......... small amounts of hormonal cream twice weekly  Fluid retention........ Hydrocort thiazide 25 mg daily  History of hypothyroidism....... Synthroid 100 g daily  Migraine headaches....... Imitrex when necessary

## 2015-02-08 NOTE — Progress Notes (Signed)
Pre visit review using our clinic review tool, if applicable. No additional management support is needed unless otherwise documented below in the visit note. 

## 2015-02-08 NOTE — Patient Instructions (Signed)
Continue your daily walking program  We will get you set up for follow-up bone density  Continue other medications  Return in one year sooner if any problems  [employer]........Marland Kitchen our new adult nurse practitioner from [facility].

## 2015-02-09 LAB — BASIC METABOLIC PANEL
BUN: 13 mg/dL (ref 6–23)
CO2: 29 mEq/L (ref 19–32)
Calcium: 9.7 mg/dL (ref 8.4–10.5)
Chloride: 100 mEq/L (ref 96–112)
Creatinine, Ser: 0.85 mg/dL (ref 0.40–1.20)
GFR: 69.36 mL/min (ref >60.00)
Glucose, Bld: 84 mg/dL (ref 70–99)
Potassium: 3.6 mEq/L (ref 3.5–5.1)
SODIUM: 137 meq/L (ref 135–145)

## 2015-02-09 LAB — CBC WITH DIFFERENTIAL/PLATELET
BASOS ABS: 0 10*3/uL (ref 0.0–0.1)
Basophils Relative: 0.4 % (ref 0.0–3.0)
EOS ABS: 0.1 10*3/uL (ref 0.0–0.7)
Eosinophils Relative: 1.6 % (ref 0.0–5.0)
HEMATOCRIT: 37.6 % (ref 36.0–46.0)
Hemoglobin: 12.5 g/dL (ref 12.0–15.0)
LYMPHS ABS: 2 10*3/uL (ref 0.7–4.0)
LYMPHS PCT: 31.8 % (ref 12.0–46.0)
MCHC: 33.3 g/dL (ref 30.0–36.0)
MCV: 85.6 fl (ref 78.0–100.0)
Monocytes Absolute: 0.5 10*3/uL (ref 0.1–1.0)
Monocytes Relative: 8.1 % (ref 3.0–12.0)
NEUTROS ABS: 3.7 10*3/uL (ref 1.4–7.7)
NEUTROS PCT: 58.1 % (ref 43.0–77.0)
Platelets: 172 10*3/uL (ref 150.0–400.0)
RBC: 4.39 Mil/uL (ref 3.87–5.11)
RDW: 15.7 % — ABNORMAL HIGH (ref 11.5–15.5)
WBC: 6.4 10*3/uL (ref 4.0–10.5)

## 2015-02-09 LAB — TSH: TSH: 2.07 u[IU]/mL (ref 0.35–4.50)

## 2015-02-10 ENCOUNTER — Ambulatory Visit (INDEPENDENT_AMBULATORY_CARE_PROVIDER_SITE_OTHER)
Admission: RE | Admit: 2015-02-10 | Discharge: 2015-02-10 | Disposition: A | Discharge status: Home / Self Care | Payer: Medicare HMO | Source: Ambulatory Visit | Attending: Family Medicine | Admitting: Family Medicine

## 2015-02-10 DIAGNOSIS — Z780 Asymptomatic menopausal state: Secondary | ICD-10-CM | POA: Diagnosis not present

## 2015-02-19 ENCOUNTER — Telehealth: Payer: Self-pay | Admitting: Family Medicine

## 2015-02-19 NOTE — Telephone Encounter (Signed)
Pt would like a call back on test results as well as results from dexa scan.

## 2015-02-22 NOTE — Telephone Encounter (Signed)
Patient is aware of dexa scan and lab results

## 2015-02-22 NOTE — Telephone Encounter (Signed)
Attempted to call patient but no answer.

## 2015-06-15 ENCOUNTER — Ambulatory Visit (INDEPENDENT_AMBULATORY_CARE_PROVIDER_SITE_OTHER): Payer: Medicare HMO | Admitting: Family Medicine

## 2015-06-15 ENCOUNTER — Encounter: Payer: Self-pay | Admitting: Family Medicine

## 2015-06-15 VITALS — BP 120/78 | Temp 98.1°F | Wt 135.0 lb

## 2015-06-15 DIAGNOSIS — R109 Unspecified abdominal pain: Secondary | ICD-10-CM | POA: Insufficient documentation

## 2015-06-15 DIAGNOSIS — N9489 Other specified conditions associated with female genital organs and menstrual cycle: Secondary | ICD-10-CM | POA: Diagnosis not present

## 2015-06-15 DIAGNOSIS — R1031 Right lower quadrant pain: Secondary | ICD-10-CM | POA: Diagnosis not present

## 2015-06-15 DIAGNOSIS — N898 Other specified noninflammatory disorders of vagina: Secondary | ICD-10-CM

## 2015-06-15 LAB — POCT URINALYSIS DIPSTICK
BILIRUBIN UA: NEGATIVE
Blood, UA: NEGATIVE
Glucose, UA: NEGATIVE
Ketones, UA: NEGATIVE
LEUKOCYTES UA: NEGATIVE
NITRITE UA: NEGATIVE
PH UA: 6
Protein, UA: NEGATIVE
Spec Grav, UA: 1.01
UROBILINOGEN UA: 0.2

## 2015-06-15 NOTE — Progress Notes (Signed)
   Subjective:    Patient ID: Teresa Patterson, female    DOB: 1940/09/25, 75 y.o.   MRN: [id number]  HPI Teresa Patterson is a 75 year old widowed female nonsmoker who comes in today for evaluation of abdominal pain and vaginal odor  She noticed about 3-4 weeks ago in odor in her vaginal area. She had no urinary tract symptoms. She's also some crampy pain in the right left lower quadrant that also started about 3 or 4 weeks ago. She has no fever chills nausea vomiting diarrhea or abdominal bloating. She had a uterus removed many years ago for nonmalignant reasons ovaries were left intact.  Her major concerns is that she might have ovarian cancer  Urinalysis was normal   Review of Systems Review of systems otherwise negative    Objective:   Physical Exam  Well-developed well-nourished female no acute distress vital signs stable she's afebrile examination the abdomen supine position the abdomen is flat the bowel sounds are normal liver spleen kidneys not enlarged second pressure no masses there is no tenderness except for some slight tenderness left lower quadrant without rebound.  Pelvic examination.... The vaginal vault was appropriate for age. There is a fair amount of vaginal dryness and erythema. No foreign bodies were identified. Bimanual exam shows no palpable masses...Marland KitchenMarland KitchenMarland Kitchen      Assessment & PlVaginal odor with right and left lower quadrant abdominal painan:  Vaginal odor with right and left lower quadrant abdominal pain..........Marland Kitchen begin workup with labs and pelvic ultrasound to rule out ovarian pathology

## 2015-06-15 NOTE — Patient Instructions (Signed)
Use the small amounts of vaginal cream 3 times weekly  We'll set you up an ultrasound to be sure your ovaries are normal

## 2015-06-15 NOTE — Progress Notes (Signed)
Pre visit review using our clinic review tool, if applicable. No additional management support is needed unless otherwise documented below in the visit note. 

## 2015-06-21 ENCOUNTER — Other Ambulatory Visit: Payer: Self-pay | Admitting: Family Medicine

## 2015-06-21 DIAGNOSIS — R1031 Right lower quadrant pain: Secondary | ICD-10-CM

## 2015-06-23 ENCOUNTER — Other Ambulatory Visit: Payer: Self-pay | Admitting: Family Medicine

## 2015-06-23 DIAGNOSIS — R1031 Right lower quadrant pain: Secondary | ICD-10-CM

## 2015-06-23 DIAGNOSIS — R1030 Lower abdominal pain, unspecified: Secondary | ICD-10-CM

## 2015-06-24 ENCOUNTER — Ambulatory Visit
Admission: RE | Admit: 2015-06-24 | Discharge: 2015-06-24 | Disposition: A | Discharge status: Home / Self Care | Payer: Medicare HMO | Source: Ambulatory Visit | Attending: Family Medicine | Admitting: Family Medicine

## 2015-06-24 ENCOUNTER — Other Ambulatory Visit: Payer: Medicare HMO

## 2015-06-24 DIAGNOSIS — R1030 Lower abdominal pain, unspecified: Secondary | ICD-10-CM

## 2015-06-24 DIAGNOSIS — R1031 Right lower quadrant pain: Secondary | ICD-10-CM

## 2015-07-07 ENCOUNTER — Other Ambulatory Visit: Payer: Self-pay

## 2015-07-07 DIAGNOSIS — Z1231 Encounter for screening mammogram for malignant neoplasm of breast: Secondary | ICD-10-CM

## 2015-07-30 ENCOUNTER — Ambulatory Visit
Admission: RE | Admit: 2015-07-30 | Discharge: 2015-07-30 | Disposition: A | Discharge status: Home / Self Care | Payer: Medicare HMO | Source: Ambulatory Visit

## 2015-07-30 DIAGNOSIS — Z1231 Encounter for screening mammogram for malignant neoplasm of breast: Secondary | ICD-10-CM

## 2015-08-23 ENCOUNTER — Ambulatory Visit (INDEPENDENT_AMBULATORY_CARE_PROVIDER_SITE_OTHER): Payer: Medicare HMO | Admitting: Family Medicine

## 2015-08-23 ENCOUNTER — Encounter: Payer: Self-pay | Admitting: Family Medicine

## 2015-08-23 VITALS — BP 130/80 | Temp 98.8°F | Wt 138.0 lb

## 2015-08-23 DIAGNOSIS — N898 Other specified noninflammatory disorders of vagina: Secondary | ICD-10-CM

## 2015-08-23 DIAGNOSIS — N9489 Other specified conditions associated with female genital organs and menstrual cycle: Secondary | ICD-10-CM | POA: Diagnosis not present

## 2015-08-23 MED ORDER — METRONIDAZOLE 500 MG PO TABS: ORAL_TABLET | ORAL | Status: DC

## 2015-08-23 NOTE — Progress Notes (Signed)
Pre visit review using our clinic review tool, if applicable. No additional management support is needed unless otherwise documented below in the visit note. 

## 2015-08-23 NOTE — Progress Notes (Signed)
   Subjective:    Patient ID: Teresa Patterson, female    DOB: [birth year], 75 y.o.   MRN: [id number]  HPI Teresa Patterson is a 75 year old widowed female nonsmoker who comes in today for evaluation of a vaginal odor  We saw her couple weeks ago with some nonspecific pelvic pain. Physical examination at that time was normal except for extreme dryness. Ultrasound showed a normal postmenopausal uterus and ovaries no evidence of malignancy. He cousin the vaginal dryness we started improving vaginal cream small amounts 3 times weekly. She comes in today for follow-up saying it's helped but she still has the bad odor no discharge   Review of Systems Review of systems negative no sprays phones douches etc.    Objective:   Physical Exam  Well-developed well-nourished female no acute distress vital signs stable she's afebrile exam normal except for vaginal dryness      Assessment & Plan:  Atrophic vaginitis may be an element of BV.......... continue the Premarin vaginal cream small amounts 3 times weekly,,,,,,,, Flagyl 500 twice a day for 7 days,

## 2015-08-23 NOTE — Patient Instructions (Signed)
Flagyl 500 mg....... one twice daily for 1 week......Marland Kitchen may repeat 1  Call when necessary

## 2015-09-22 ENCOUNTER — Telehealth: Payer: Self-pay | Admitting: Family Medicine

## 2015-09-22 NOTE — Telephone Encounter (Signed)
Left message on machine for patient that per Dr Sherren Mocha she should go to GYN.

## 2015-09-22 NOTE — Telephone Encounter (Signed)
Pt call to say she is still having the same odor issue and would like to know what the next step would be

## 2015-10-25 ENCOUNTER — Encounter: Payer: Self-pay | Admitting: Gynecology

## 2015-10-25 ENCOUNTER — Ambulatory Visit (INDEPENDENT_AMBULATORY_CARE_PROVIDER_SITE_OTHER): Payer: Medicare HMO | Admitting: Gynecology

## 2015-10-25 VITALS — BP 130/86 | Ht 64.0 in | Wt 135.0 lb

## 2015-10-25 DIAGNOSIS — N9489 Other specified conditions associated with female genital organs and menstrual cycle: Secondary | ICD-10-CM | POA: Diagnosis not present

## 2015-10-25 DIAGNOSIS — N952 Postmenopausal atrophic vaginitis: Secondary | ICD-10-CM | POA: Diagnosis not present

## 2015-10-25 DIAGNOSIS — N894 Leukoplakia of vagina: Secondary | ICD-10-CM

## 2015-10-25 DIAGNOSIS — N898 Other specified noninflammatory disorders of vagina: Secondary | ICD-10-CM

## 2015-10-25 DIAGNOSIS — Z1272 Encounter for screening for malignant neoplasm of vagina: Secondary | ICD-10-CM

## 2015-10-25 DIAGNOSIS — B3731 Acute candidiasis of vulva and vagina: Secondary | ICD-10-CM

## 2015-10-25 DIAGNOSIS — B373 Candidiasis of vulva and vagina: Secondary | ICD-10-CM | POA: Diagnosis not present

## 2015-10-25 LAB — WET PREP FOR TRICH, YEAST, CLUE
Clue Cells Wet Prep HPF POC: NONE SEEN
Trich, Wet Prep: NONE SEEN

## 2015-10-25 MED ORDER — FLUCONAZOLE 150 MG PO TABS
150.0000 mg | ORAL_TABLET | Freq: Once | ORAL | Status: DC
Start: 2015-10-25 — End: 2016-07-11

## 2015-10-25 NOTE — Progress Notes (Signed)
HPI: Patient is a 75 year old who was referred to our practice from her primary physician Dr. Stevie Kern as a result of patient's persistent vaginal odor for several years. He is recently treating her for a suspected BV with Flagyl which she's currently completing. And she has been now over a year on Premarin vaginal cream twice a week. Her husband passed away over a year ago. She is not sexually active. She is currently on no other hormone replacement therapy. She was many years ago on oral HRT. She does have a history of an abdominal hysterectomy for benign pathology. She also has her right tube and ovary had been removed at the time of surgery for benign cyst. Patient with no GU or GI complaints. She does not douche. She denies any fever, chills, nausea, vomiting. Patient reports no past history of any abnormal Pap smear prior to her hysterectomy or after.   ROS: A ROS was performed and pertinent positives and negatives are included in the history.  GENERAL: No fevers or chills. HEENT: No change in vision, no earache, sore throat or sinus congestion. NECK: No pain or stiffness. CARDIOVASCULAR: No chest pain or pressure. No palpitations. PULMONARY: No shortness of breath, cough or wheeze. GASTROINTESTINAL: No abdominal pain, nausea, vomiting or diarrhea, melena or bright red blood per rectum. GENITOURINARY: No urinary frequency, urgency, hesitancy or dysuria. MUSCULOSKELETAL: No joint or muscle pain, no back pain, no recent trauma. DERMATOLOGIC: No rash, no itching, no lesions. ENDOCRINE: No polyuria, polydipsia, no heat or cold intolerance. No recent change in weight. HEMATOLOGICAL: No anemia or easy bruising or bleeding. NEUROLOGIC: No headache, seizures, numbness, tingling or weakness. PSYCHIATRIC: No depression, no loss of interest in normal activity or change in sleep pattern.   PE: Well-developed well-nourished female with the above-mentioned complaints Abdomen: Soft nontender no rebound or  guarding Pelvic: Bartholin urethra Skene was within normal limits Vagina: No lesions or discharge Vaginal cuff leukoplakic area center of the vaginal cuff versus scarring effect from hysterectomy.  The colposcope was brought into the room and a detail colposcopic evaluation of the external genitalia, perineum and perirectal region did not demonstrate any abnormalities. The speculum was introduced into the vagina. Acetic acid was applied in the area in the center of the vaginal cuff leukoplakic versus scarring was noted. With a key punch biopsy instrument a biopsy from this area was obtained and submitted for histological evaluation. Silver nitrate was used for hemostasis.   Assessment Plan: With long-standing history of vaginal dryness prior hysterectomy for benign pathology. Patient with no vaginal discharge reported nevertheless her Pap smear along with a wet prep was obtained today. Few yeast were noted on her exam. We'll wait for the results of the pathology and manage accordingly. If normal and recommend that she use Replens vaginal moisturizer to help also with the vaginal odor at times. She is very concerned because her husband who had died of cancer stated that his symptoms started with a foul-smelling odor of his urine and she has become very anxious as a result of this. I reassured her that I do not believe that the vaginal cuff biopsies anything Tory about but since we have the result will notify her. Also she should continue with her Premarin vaginal cream for vaginal atrophy twice a week as well.    Greater than 50% of time was spent in counseling and coordinating care of this patient.   Time of consultation: 20   Minutes.

## 2015-10-25 NOTE — Patient Instructions (Addendum)
Fluconazole tablets What is this medicine? FLUCONAZOLE (floo KON na zole) is an antifungal medicine. It is used to treat certain kinds of fungal or yeast infections. This medicine may be used for other purposes; ask your health care provider or pharmacist if you have questions. What should I tell my health care provider before I take this medicine? They need to know if you have any of these conditions: -electrolyte abnormalities -history of irregular heart beat -kidney disease -an unusual or allergic reaction to fluconazole, other azole antifungals, medicines, foods, dyes, or preservatives -pregnant or trying to get pregnant -breast-feeding How should I use this medicine? Take this medicine by mouth. Follow the directions on the prescription label. Do not take your medicine more often than directed. Talk to your pediatrician regarding the use of this medicine in children. Special care may be needed. This medicine has been used in children as young as 58 months of age. Overdosage: If you think you have taken too much of this medicine contact a poison control center or emergency room at once. NOTE: This medicine is only for you. Do not share this medicine with others. What if I miss a dose? If you miss a dose, take it as soon as you can. If it is almost time for your next dose, take only that dose. Do not take double or extra doses. What may interact with this medicine? Do not take this medicine with any of the following medications: -astemizole -certain medicines for irregular heart beat like dofetilide, dronedarone, quinidine -cisapride -erythromycin -lomitapide -other medicines that prolong the QT interval (cause an abnormal heart rhythm) -pimozide -terfenadine -thioridazine -tolvaptan -ziprasidone This medicine may also interact with the following medications: -antiviral medicines for HIV or AIDS -birth control pills -certain antibiotics like rifabutin, rifampin -certain  medicines for blood pressure like amlodipine, isradipine, felodipine, hydrochlorothiazide, losartan, nifedipine -certain medicines for cancer like cyclophosphamide, vinblastine, vincristine -certain medicines for cholesterol like atorvastatin, lovastatin, fluvastatin, simvastatin -certain medicines for depression, anxiety, or psychotic disturbances like amitriptyline, midazolam, nortriptyline, triazolam -certain medicines for diabetes like glipizide, glyburide, tolbutamide -certain medicines for pain like alfentanil, fentanyl, methadone -certain medicines for seizures like carbamazepine, phenytoin -certain medicines that treat or prevent blood clots like warfarin -halofantrine -medicines that lower your chance of fighting infection like cyclosporine, prednisone, tacrolimus -NSAIDS, medicines for pain and inflammation, like celecoxib, diclofenac, flurbiprofen, ibuprofen, meloxicam, naproxen -other medicines for fungal infections -sirolimus -theophylline -tofacitinib This list may not describe all possible interactions. Give your health care provider a list of all the medicines, herbs, non-prescription drugs, or dietary supplements you use. Also tell them if you smoke, drink alcohol, or use illegal drugs. Some items may interact with your medicine. What should I watch for while using this medicine? Visit your doctor or health care professional for regular checkups. If you are taking this medicine for a long time you may need blood work. Tell your doctor if your symptoms do not improve. Some fungal infections need many weeks or months of treatment to cure. Alcohol can increase possible damage to your liver. Avoid alcoholic drinks. If you have a vaginal infection, do not have sex until you have finished your treatment. You can wear a sanitary napkin. Do not use tampons. Wear freshly washed cotton, not synthetic, panties. What side effects may I notice from receiving this medicine? Side effects that  you should report to your doctor or health care professional as soon as possible: -allergic reactions like skin rash or itching, hives, swelling of the lips, mouth,  tongue, or throat -dark urine -feeling dizzy or faint -irregular heartbeat or chest pain -redness, blistering, peeling or loosening of the skin, including inside the mouth -trouble breathing -unusual bruising or bleeding -vomiting -yellowing of the eyes or skin Side effects that usually do not require medical attention (report to your doctor or health care professional if they continue or are bothersome): -changes in how food tastes -diarrhea -headache -stomach upset or nausea This list may not describe all possible side effects. Call your doctor for medical advice about side effects. You may report side effects to FDA at 1-800-FDA-1088. Where should I keep my medicine? Keep out of the reach of children. Store at room temperature below 30 degrees C (86 degrees F). Throw away any medicine after the expiration date. NOTE: This sheet is a summary. It may not cover all possible information. If you have questions about this medicine, talk to your doctor, pharmacist, or health care provider.    2016, Elsevier/Gold Standard. (2012-12-14 16:13:04) Monilial Vaginitis Vaginitis in a soreness, swelling and redness (inflammation) of the vagina and vulva. Monilial vaginitis is not a sexually transmitted infection. CAUSES  Yeast vaginitis is caused by yeast (candida) that is normally found in your vagina. With a yeast infection, the candida has overgrown in number to a point that upsets the chemical balance. SYMPTOMS   White, thick vaginal discharge.  Swelling, itching, redness and irritation of the vagina and possibly the lips of the vagina (vulva).  Burning or painful urination.  Painful intercourse. DIAGNOSIS  Things that may contribute to monilial vaginitis are:  Postmenopausal and virginal  states.  Pregnancy.  Infections.  Being tired, sick or stressed, especially if you had monilial vaginitis in the past.  Diabetes. Good control will help lower the chance.  Birth control pills.  Tight fitting garments.  Using bubble bath, feminine sprays, douches or deodorant tampons.  Taking certain medications that kill germs (antibiotics).  Sporadic recurrence can occur if you become ill. TREATMENT  Your caregiver will give you medication.  There are several kinds of anti monilial vaginal creams and suppositories specific for monilial vaginitis. For recurrent yeast infections, use a suppository or cream in the vagina 2 times a week, or as directed.  Anti-monilial or steroid cream for the itching or irritation of the vulva may also be used. Get your caregiver's permission.  Painting the vagina with methylene blue solution may help if the monilial cream does not work.  Eating yogurt may help prevent monilial vaginitis. HOME CARE INSTRUCTIONS   Finish all medication as prescribed.  Do not have sex until treatment is completed or after your caregiver tells you it is okay.  Take warm sitz baths.  Do not douche.  Do not use tampons, especially scented ones.  Wear cotton underwear.  Avoid tight pants and panty hose.  Tell your sexual partner that you have a yeast infection. They should go to their caregiver if they have symptoms such as mild rash or itching.  Your sexual partner should be treated as well if your infection is difficult to eliminate.  Practice safer sex. Use condoms.  Some vaginal medications cause latex condoms to fail. Vaginal medications that harm condoms are:  Cleocin cream.  Butoconazole (Femstat).  Terconazole (Terazol) vaginal suppository.  Miconazole (Monistat) (may be purchased over the counter). SEEK MEDICAL CARE IF:   You have a temperature by mouth above 102 F (38.9 C).  The infection is getting worse after 2 days of  treatment.  The infection is not  getting better after 3 days of treatment.  You develop blisters in or around your vagina.  You develop vaginal bleeding, and it is not your menstrual period.  You have pain when you urinate.  You develop intestinal problems.  You have pain with sexual intercourse.   This information is not intended to replace advice given to you by your health care provider. Make sure you discuss any questions you have with your health care provider.   Document Released: 02/15/2005 Document Revised: 07/31/2011 Document Reviewed: 11/09/2014 Elsevier Interactive Patient Education [employer]. Colposcopy Colposcopy is a procedure to examine your cervix and vagina, or the area around the outside of your vagina, for abnormalities or signs of disease. The procedure is done using a lighted microscope called a colposcope. Tissue samples may be collected during the colposcopy if your health care provider finds any unusual cells. A colposcopy may be done if a woman has:  An abnormal Pap test. A Pap test is a medical test done to evaluate cells that are on the surface of the cervix.  A Pap test result that is suggestive of human papillomavirus (HPV). This virus can cause genital warts and is linked to the development of cervical cancer.  A sore on her cervix and the results of a Pap test were normal.  Genital warts on the cervix or in or around the outside of the vagina.  A mother who took the drug diethylstilbestrol (DES) while pregnant.  Painful intercourse.  Vaginal bleeding, especially after sexual intercourse. LET [facility] CARE PROVIDER KNOW ABOUT:  Any allergies you have.  All medicines you are taking, including vitamins, herbs, eye drops, creams, and over-the-counter medicines.  Previous problems you or members of your family have had with the use of anesthetics.  Any blood disorders you have.  Previous surgeries you have had.  Medical conditions  you have. RISKS AND COMPLICATIONS Generally, a colposcopy is a safe procedure. However, as with any procedure, complications can occur. Possible complications include:  Bleeding.  Infection.  Missed lesions. BEFORE THE PROCEDURE   Tell your health care provider if you have your menstrual period. A colposcopy typically is not done during menstruation.  For 24 hours before the colposcopy, do not:  Douche.  Use tampons.  Use medicines, creams, or suppositories in the vagina.  Have sexual intercourse. PROCEDURE  During the procedure, you will be lying on your back with your feet in foot rests (stirrups). A warm metal or plastic instrument (speculum) will be placed in your vagina to keep it open and to allow the health care provider to see the cervix. The colposcope will be placed outside the vagina. It will be used to magnify and examine the cervix, vagina, and the area around the outside of the vagina. A small amount of liquid solution will be placed on the area that is to be viewed. This solution will make it easier to see the abnormal cells. Your health care provider will use tools to suck out mucus and cells from the canal of the cervix. Then he or she will record the location of the abnormal areas. If a biopsy is done during the procedure, a medicine will usually be given to numb the area (local anesthetic). You may feel mild pain or cramping while the biopsy is done. After the procedure, tissue samples collected during the biopsy will be sent to a lab for analysis. AFTER THE PROCEDURE  You will be given instructions on when to follow up with your  health care provider for your test results. It is important to keep your appointment.   This information is not intended to replace advice given to you by your health care provider. Make sure you discuss any questions you have with your health care provider.   Document Released: 07/29/2002 Document Revised: 01/08/2013 Document Reviewed:  12/05/2012 Elsevier Interactive Patient Education [employer].

## 2015-10-27 LAB — PAP IG W/ RFLX HPV ASCU

## 2015-11-25 DIAGNOSIS — M1811 Unilateral primary osteoarthritis of first carpometacarpal joint, right hand: Secondary | ICD-10-CM | POA: Insufficient documentation

## 2015-12-28 ENCOUNTER — Telehealth: Payer: Self-pay | Admitting: Family Medicine

## 2015-12-28 NOTE — Telephone Encounter (Signed)
Teresa Patterson pt would like for you to give her a call in reference to the bill for CPE on last year.

## 2016-03-14 ENCOUNTER — Other Ambulatory Visit: Payer: Self-pay | Admitting: Family Medicine

## 2016-05-23 ENCOUNTER — Encounter: Payer: Medicare HMO | Admitting: Family Medicine

## 2016-06-12 ENCOUNTER — Other Ambulatory Visit: Payer: Self-pay | Admitting: Family Medicine

## 2016-07-03 DIAGNOSIS — R69 Illness, unspecified: Secondary | ICD-10-CM | POA: Diagnosis not present

## 2016-07-11 ENCOUNTER — Encounter: Payer: Self-pay | Admitting: Family Medicine

## 2016-07-11 ENCOUNTER — Ambulatory Visit (INDEPENDENT_AMBULATORY_CARE_PROVIDER_SITE_OTHER): Payer: Medicare HMO | Admitting: Family Medicine

## 2016-07-11 VITALS — BP 120/80 | HR 65 | Temp 97.6°F | Ht 64.0 in | Wt 137.1 lb

## 2016-07-11 DIAGNOSIS — E039 Hypothyroidism, unspecified: Secondary | ICD-10-CM | POA: Diagnosis not present

## 2016-07-11 DIAGNOSIS — Z23 Encounter for immunization: Secondary | ICD-10-CM | POA: Diagnosis not present

## 2016-07-11 DIAGNOSIS — I773 Arterial fibromuscular dysplasia: Secondary | ICD-10-CM

## 2016-07-11 DIAGNOSIS — Z0000 Encounter for general adult medical examination without abnormal findings: Secondary | ICD-10-CM | POA: Diagnosis not present

## 2016-07-11 DIAGNOSIS — G43009 Migraine without aura, not intractable, without status migrainosus: Secondary | ICD-10-CM | POA: Diagnosis not present

## 2016-07-11 DIAGNOSIS — Z780 Asymptomatic menopausal state: Secondary | ICD-10-CM

## 2016-07-11 DIAGNOSIS — R0989 Other specified symptoms and signs involving the circulatory and respiratory systems: Secondary | ICD-10-CM | POA: Insufficient documentation

## 2016-07-11 LAB — CBC WITH DIFFERENTIAL/PLATELET
Basophils Absolute: 0.1 10*3/uL (ref 0.0–0.1)
Basophils Relative: 1.7 % (ref 0.0–3.0)
EOS PCT: 2.1 % (ref 0.0–5.0)
Eosinophils Absolute: 0.1 10*3/uL (ref 0.0–0.7)
HEMATOCRIT: 34 % — AB (ref 36.0–46.0)
Hemoglobin: 10.9 g/dL — ABNORMAL LOW (ref 12.0–15.0)
Lymphocytes Relative: 33.3 % (ref 12.0–46.0)
Lymphs Abs: 1.8 10*3/uL (ref 0.7–4.0)
MCHC: 32.2 g/dL (ref 30.0–36.0)
MCV: 81.2 fl (ref 78.0–100.0)
MONOS PCT: 10.4 % (ref 3.0–12.0)
Monocytes Absolute: 0.6 10*3/uL (ref 0.1–1.0)
Neutro Abs: 2.8 10*3/uL (ref 1.4–7.7)
Neutrophils Relative %: 52.5 % (ref 43.0–77.0)
Platelets: 215 10*3/uL (ref 150.0–400.0)
RBC: 4.19 Mil/uL (ref 3.87–5.11)
RDW: 17 % — ABNORMAL HIGH (ref 11.5–15.5)
WBC: 5.4 10*3/uL (ref 4.0–10.5)

## 2016-07-11 LAB — POCT URINALYSIS DIPSTICK
BILIRUBIN UA: NEGATIVE
Blood, UA: NEGATIVE
Glucose, UA: NEGATIVE
Ketones, UA: NEGATIVE
LEUKOCYTES UA: NEGATIVE
Nitrite, UA: NEGATIVE
PROTEIN UA: NEGATIVE
Spec Grav, UA: 1.015
Urobilinogen, UA: 0.2
pH, UA: 7

## 2016-07-11 LAB — BASIC METABOLIC PANEL
BUN: 12 mg/dL (ref 6–23)
CHLORIDE: 104 meq/L (ref 96–112)
CO2: 28 mEq/L (ref 19–32)
Calcium: 9.9 mg/dL (ref 8.4–10.5)
Creatinine, Ser: 0.88 mg/dL (ref 0.40–1.20)
GFR: 66.38 mL/min (ref >60.00)
Glucose, Bld: 89 mg/dL (ref 70–99)
POTASSIUM: 3.8 meq/L (ref 3.5–5.1)
Sodium: 139 mEq/L (ref 135–145)

## 2016-07-11 LAB — TSH: TSH: 1.35 u[IU]/mL (ref 0.35–4.50)

## 2016-07-11 MED ORDER — ESTROGENS, CONJUGATED 0.625 MG/GM VA CREA: TOPICAL_CREAM | VAGINAL | 11 refills | Status: DC

## 2016-07-11 MED ORDER — SUMATRIPTAN 5 MG/ACT NA SOLN: 1.0000 | NASAL | 3 refills | Status: DC | PRN

## 2016-07-11 NOTE — Patient Instructions (Signed)
You can get the Premarin cream [email or web address]  Continue current medications  We'll get you set up for a scan of your aorta. I think it's just a prominent aorta and not an aneurysm or it however we'll get a scan to be sure    Return in one year sooner if any problems

## 2016-07-11 NOTE — Progress Notes (Signed)
Pre visit review using our clinic review tool, if applicable. No additional management support is needed unless otherwise documented below in the visit note. 

## 2016-07-11 NOTE — Progress Notes (Signed)
Teresa Patterson is a 76 year old widowed female nonsmoker who comes in today for evaluation of multiple issues  She takes Synthroid 100 g daily because of a history of hypothyroidism. She's asymptomatic. TSH will be done today  She takes Hydrocort thiazide 25 mg daily because of some venous insufficiency.  She uses Premarin cream small mouth twice weekly because of extremely dry vagina  She is Imitrex 5 mg when necessary for migraine headaches. Her migraines a decreased in frequency and severity over time.  She gets routine eye care, dental care, BSE monthly, annual mammography, colonoscopy every 5 years by Teresa Patterson. She said she had a polyp at one time but none recently.  Vaccinations last tetanus booster was November 2006. We'll give her a tetanus booster today. Seasonal flu shot last fall. She had a [facility]ingles vaccine at [facility].  She walks daily her weight is good on 37 pounds. She also does yoga twice a week.  Cognitive function normal she walks daily home health safety reviewed no issues identified, no guns in the house, she does have a healthcare power of attorney and living well  14 point review of systems reviewed and otherwise negative  BP 120/80 (BP Location: Right Arm, Patient Position: Sitting, Cuff Size: Normal)   Pulse 65   Temp 97.6 F (36.4 C) (Oral)   Ht 5\' 4"  (1.626 m)   Wt 137 lb 1.6 oz (62.2 kg)   BMI 23.53 kg/m  General she is well-developed well-nourished female no acute distress HEENT were negative except for dense bilateral cataracts. Left is worse in the right. She states she symptomatic with difficulty sleeping at night and halos around objects. I will refer to Teresa Patterson.  Neck was supple thyroid is not enlarged no carotid bruits cardiopulmonary exam normal breast exam normal abdominal exam liver spleen kidneys not enlarged she Teresa Patterson has a prominent aorta. No bruits. I don't think it's an aneurysm but we'll get a scan to be sure. Pelvic and rectal done  previously therefore not repeated. She had her uterus removed many years ago for nonmalignant reasons and also one ovary removed. She has one ovary remaining. Pelvic exam last year was normal she is asymptomatic no bloating etc.  Extremities normal skin no peripheral pulses normal except for 2 lesions one is a skin tag on her left upper abdomen the others of seborrheic keratosis left scapular area. His red and irritated from her bra rubs. We'll have her come back to get that removed.  #1 hypothyroidism.....Marland Kitchen continue Synthroid check labs  #2 venous insufficiency........ continue Hydrocort thiazide 25 mg daily  Vaginal dryness........ continue Premarin cream a  Number for migraine headaches......... continue Imitrex when necessary  #5. Prominent aorta.......... scan to rule out aneurysm

## 2016-07-14 ENCOUNTER — Other Ambulatory Visit: Payer: Self-pay | Admitting: Family Medicine

## 2016-07-14 DIAGNOSIS — Z1231 Encounter for screening mammogram for malignant neoplasm of breast: Secondary | ICD-10-CM

## 2016-07-25 ENCOUNTER — Telehealth: Payer: Self-pay

## 2016-07-25 NOTE — Telephone Encounter (Signed)
Received PA request from insurance company for Sumatriptan Spray 5 MG/ACT. PA submitted & is pending. Key: [id number]

## 2016-07-27 ENCOUNTER — Other Ambulatory Visit: Payer: Self-pay | Admitting: Family Medicine

## 2016-07-27 DIAGNOSIS — Z136 Encounter for screening for cardiovascular disorders: Secondary | ICD-10-CM

## 2016-08-04 ENCOUNTER — Ambulatory Visit (HOSPITAL_COMMUNITY)
Admission: RE | Admit: 2016-08-04 | Discharge: 2016-08-04 | Disposition: A | Discharge status: Home / Self Care | Payer: Medicare HMO | Source: Ambulatory Visit | Attending: Cardiovascular Disease | Admitting: Cardiovascular Disease

## 2016-08-04 DIAGNOSIS — Z136 Encounter for screening for cardiovascular disorders: Secondary | ICD-10-CM

## 2016-08-09 ENCOUNTER — Ambulatory Visit
Admission: RE | Admit: 2016-08-09 | Discharge: 2016-08-09 | Disposition: A | Discharge status: Home / Self Care | Payer: Medicare HMO | Source: Ambulatory Visit | Attending: Family Medicine | Admitting: Family Medicine

## 2016-08-09 ENCOUNTER — Ambulatory Visit: Payer: Medicare HMO | Admitting: Family Medicine

## 2016-08-09 DIAGNOSIS — Z1231 Encounter for screening mammogram for malignant neoplasm of breast: Secondary | ICD-10-CM

## 2016-08-14 ENCOUNTER — Telehealth: Payer: Self-pay | Admitting: Family Medicine

## 2016-08-14 ENCOUNTER — Encounter: Payer: Self-pay | Admitting: Family Medicine

## 2016-08-14 NOTE — Telephone Encounter (Signed)
Pt returning your call, not sure what it is concerning.  Pt would liek a call back asap. thanks

## 2016-08-15 NOTE — Telephone Encounter (Signed)
Pt notified of normal vascular study

## 2016-08-29 ENCOUNTER — Ambulatory Visit (INDEPENDENT_AMBULATORY_CARE_PROVIDER_SITE_OTHER): Payer: Medicare HMO | Admitting: Family Medicine

## 2016-08-29 ENCOUNTER — Encounter: Payer: Self-pay | Admitting: Family Medicine

## 2016-08-29 VITALS — BP 126/82 | Temp 98.3°F | Ht 64.0 in | Wt 141.0 lb

## 2016-08-29 DIAGNOSIS — D235 Other benign neoplasm of skin of trunk: Secondary | ICD-10-CM

## 2016-08-29 DIAGNOSIS — D225 Melanocytic nevi of trunk: Secondary | ICD-10-CM

## 2016-08-29 DIAGNOSIS — B078 Other viral warts: Secondary | ICD-10-CM | POA: Diagnosis not present

## 2016-08-29 DIAGNOSIS — D216 Benign neoplasm of connective and other soft tissue of trunk, unspecified: Secondary | ICD-10-CM | POA: Diagnosis not present

## 2016-08-29 NOTE — Progress Notes (Signed)
Teresa Patterson is a 76 year old widowed female nonsmoker comes in today for move of 2 lesions  Lesion #1 is an 9 mm x 9 mm pedunculated lesion with irregular margins left mid back  #2 is a 9 mm x 9 mm Dr. lesion left anterior chest wall in the midclavicular line  After informed consent both lesions were cleaned with alcohol and anesthetized with 1% Xylocaine with epinephrine. The lesions were removed with 3 mm margins. The bases were cauterized Band-Aid was applied. The lesions were sent for pathologic analysis.  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  #1 9 mm x 9 mm lesion posterior back removed......... path pending  #2 8 mm x 8 mm lesion left anterior chest wall..... Removed path pending

## 2016-08-29 NOTE — Progress Notes (Signed)
Pre visit review using our clinic review tool, if applicable. No additional management support is needed unless otherwise documented below in the visit note. 

## 2016-09-07 DIAGNOSIS — H25813 Combined forms of age-related cataract, bilateral: Secondary | ICD-10-CM | POA: Diagnosis not present

## 2016-09-07 DIAGNOSIS — H524 Presbyopia: Secondary | ICD-10-CM | POA: Diagnosis not present

## 2016-10-04 ENCOUNTER — Encounter: Payer: Self-pay | Admitting: Gynecology

## 2016-12-05 DIAGNOSIS — Z7982 Long term (current) use of aspirin: Secondary | ICD-10-CM | POA: Diagnosis not present

## 2016-12-05 DIAGNOSIS — K5900 Constipation, unspecified: Secondary | ICD-10-CM | POA: Diagnosis not present

## 2016-12-05 DIAGNOSIS — K229 Disease of esophagus, unspecified: Secondary | ICD-10-CM | POA: Diagnosis not present

## 2016-12-05 DIAGNOSIS — E039 Hypothyroidism, unspecified: Secondary | ICD-10-CM | POA: Diagnosis not present

## 2016-12-05 DIAGNOSIS — I10 Essential (primary) hypertension: Secondary | ICD-10-CM | POA: Diagnosis not present

## 2016-12-05 DIAGNOSIS — G43009 Migraine without aura, not intractable, without status migrainosus: Secondary | ICD-10-CM | POA: Diagnosis not present

## 2016-12-05 DIAGNOSIS — Z0000 Encounter for general adult medical examination without abnormal findings: Secondary | ICD-10-CM | POA: Diagnosis not present

## 2017-01-01 DIAGNOSIS — H5213 Myopia, bilateral: Secondary | ICD-10-CM | POA: Diagnosis not present

## 2017-01-08 DIAGNOSIS — R69 Illness, unspecified: Secondary | ICD-10-CM | POA: Diagnosis not present

## 2017-01-30 ENCOUNTER — Ambulatory Visit (INDEPENDENT_AMBULATORY_CARE_PROVIDER_SITE_OTHER): Payer: Medicare HMO | Admitting: Family Medicine

## 2017-01-30 ENCOUNTER — Encounter: Payer: Self-pay | Admitting: Family Medicine

## 2017-01-30 VITALS — BP 130/80 | HR 66 | Temp 98.0°F | Wt 135.0 lb

## 2017-01-30 DIAGNOSIS — L259 Unspecified contact dermatitis, unspecified cause: Secondary | ICD-10-CM

## 2017-01-30 DIAGNOSIS — Z23 Encounter for immunization: Secondary | ICD-10-CM

## 2017-01-30 MED ORDER — PREDNISONE 20 MG PO TABS: ORAL_TABLET | ORAL | 1 refills | Status: DC

## 2017-01-30 NOTE — Patient Instructions (Signed)
Prednisone 20 mg......Marland Kitchen 1 tab 5 days, half a tab 5 days, then a half a tab Monday Wednesday Friday for a two-week taper ........... you may stop the prednisone after 10 days with the ratchet itching is completely gone

## 2017-01-30 NOTE — Addendum Note (Signed)
Addended by: Wyvonne Lenz on: 01/30/2017 12:17 PM   Modules accepted: Orders

## 2017-01-30 NOTE — Progress Notes (Signed)
Teresa Patterson is a 76 year old widowed female nonsmoker who comes in today for evaluation of his skin rash  She states she was cutting grass last Friday in then Saturday morning started itching and noticed a rash on her left leg.  Her systems otherwise negative  BP 130/80 (BP Location: Left Arm, Patient Position: Sitting, Cuff Size: Normal)   Pulse 66   Temp 98 F (36.7 C) (Oral)   Wt 135 lb (61.2 kg)   BMI 23.17 kg/m  Examination skin shows diffuse erythematous raised red lesions consistent with a contact type dermatitis.  #1 contact dermatitis..............Marland Kitchen prednisone burst and taper

## 2017-02-09 ENCOUNTER — Encounter: Payer: Self-pay | Admitting: Family Medicine

## 2017-02-26 DIAGNOSIS — L570 Actinic keratosis: Secondary | ICD-10-CM | POA: Diagnosis not present

## 2017-02-26 DIAGNOSIS — L821 Other seborrheic keratosis: Secondary | ICD-10-CM | POA: Diagnosis not present

## 2017-02-26 DIAGNOSIS — L720 Epidermal cyst: Secondary | ICD-10-CM | POA: Diagnosis not present

## 2017-05-10 DIAGNOSIS — H524 Presbyopia: Secondary | ICD-10-CM | POA: Diagnosis not present

## 2017-05-10 DIAGNOSIS — H25813 Combined forms of age-related cataract, bilateral: Secondary | ICD-10-CM | POA: Diagnosis not present

## 2017-06-22 DIAGNOSIS — H524 Presbyopia: Secondary | ICD-10-CM | POA: Diagnosis not present

## 2017-06-22 DIAGNOSIS — H5203 Hypermetropia, bilateral: Secondary | ICD-10-CM | POA: Diagnosis not present

## 2017-06-22 DIAGNOSIS — H52223 Regular astigmatism, bilateral: Secondary | ICD-10-CM | POA: Diagnosis not present

## 2017-06-22 DIAGNOSIS — H16041 Marginal corneal ulcer, right eye: Secondary | ICD-10-CM | POA: Diagnosis not present

## 2017-06-25 DIAGNOSIS — H52223 Regular astigmatism, bilateral: Secondary | ICD-10-CM | POA: Diagnosis not present

## 2017-06-25 DIAGNOSIS — H16041 Marginal corneal ulcer, right eye: Secondary | ICD-10-CM | POA: Diagnosis not present

## 2017-06-25 DIAGNOSIS — H5203 Hypermetropia, bilateral: Secondary | ICD-10-CM | POA: Diagnosis not present

## 2017-06-26 DIAGNOSIS — H52223 Regular astigmatism, bilateral: Secondary | ICD-10-CM | POA: Diagnosis not present

## 2017-06-26 DIAGNOSIS — H5203 Hypermetropia, bilateral: Secondary | ICD-10-CM | POA: Diagnosis not present

## 2017-06-26 DIAGNOSIS — H16041 Marginal corneal ulcer, right eye: Secondary | ICD-10-CM | POA: Diagnosis not present

## 2017-06-27 DIAGNOSIS — H16041 Marginal corneal ulcer, right eye: Secondary | ICD-10-CM | POA: Diagnosis not present

## 2017-06-27 DIAGNOSIS — H52223 Regular astigmatism, bilateral: Secondary | ICD-10-CM | POA: Diagnosis not present

## 2017-06-27 DIAGNOSIS — H5203 Hypermetropia, bilateral: Secondary | ICD-10-CM | POA: Diagnosis not present

## 2017-07-05 DIAGNOSIS — H168 Other keratitis: Secondary | ICD-10-CM | POA: Diagnosis not present

## 2017-07-09 DIAGNOSIS — H168 Other keratitis: Secondary | ICD-10-CM | POA: Diagnosis not present

## 2017-07-11 DIAGNOSIS — H16391 Other interstitial and deep keratitis, right eye: Secondary | ICD-10-CM | POA: Diagnosis not present

## 2017-07-18 ENCOUNTER — Encounter: Payer: Self-pay | Admitting: Family Medicine

## 2017-07-18 ENCOUNTER — Ambulatory Visit (INDEPENDENT_AMBULATORY_CARE_PROVIDER_SITE_OTHER): Payer: Medicare HMO | Admitting: Family Medicine

## 2017-07-18 VITALS — BP 118/84 | HR 66 | Temp 98.2°F | Ht 64.0 in | Wt 133.0 lb

## 2017-07-18 DIAGNOSIS — Z0000 Encounter for general adult medical examination without abnormal findings: Secondary | ICD-10-CM

## 2017-07-18 DIAGNOSIS — G43009 Migraine without aura, not intractable, without status migrainosus: Secondary | ICD-10-CM

## 2017-07-18 DIAGNOSIS — E039 Hypothyroidism, unspecified: Secondary | ICD-10-CM

## 2017-07-18 DIAGNOSIS — Z780 Asymptomatic menopausal state: Secondary | ICD-10-CM

## 2017-07-18 DIAGNOSIS — H168 Other keratitis: Secondary | ICD-10-CM | POA: Diagnosis not present

## 2017-07-18 LAB — BASIC METABOLIC PANEL
BUN: 13 mg/dL (ref 6–23)
CO2: 30 mEq/L (ref 19–32)
Calcium: 10.3 mg/dL (ref 8.4–10.5)
Chloride: 101 mEq/L (ref 96–112)
Creatinine, Ser: 0.86 mg/dL (ref 0.40–1.20)
GFR: 67.98 mL/min (ref >60.00)
Glucose, Bld: 85 mg/dL (ref 70–99)
POTASSIUM: 3.8 meq/L (ref 3.5–5.1)
Sodium: 138 mEq/L (ref 135–145)

## 2017-07-18 LAB — CBC WITH DIFFERENTIAL/PLATELET
Basophils Absolute: 0.1 10*3/uL (ref 0.0–0.1)
Basophils Relative: 1.5 % (ref 0.0–3.0)
EOS PCT: 2.4 % (ref 0.0–5.0)
Eosinophils Absolute: 0.2 10*3/uL (ref 0.0–0.7)
HEMATOCRIT: 39.9 % (ref 36.0–46.0)
HEMOGLOBIN: 13.1 g/dL (ref 12.0–15.0)
LYMPHS PCT: 26.9 % (ref 12.0–46.0)
Lymphs Abs: 1.7 10*3/uL (ref 0.7–4.0)
MCHC: 32.9 g/dL (ref 30.0–36.0)
MCV: 82.4 fl (ref 78.0–100.0)
MONOS PCT: 7.5 % (ref 3.0–12.0)
Monocytes Absolute: 0.5 10*3/uL (ref 0.1–1.0)
Neutro Abs: 3.9 10*3/uL (ref 1.4–7.7)
Neutrophils Relative %: 61.7 % (ref 43.0–77.0)
Platelets: 201 10*3/uL (ref 150.0–400.0)
RBC: 4.84 Mil/uL (ref 3.87–5.11)
RDW: 19.1 % — ABNORMAL HIGH (ref 11.5–15.5)
WBC: 6.3 10*3/uL (ref 4.0–10.5)

## 2017-07-18 LAB — POCT URINALYSIS DIPSTICK
Bilirubin, UA: NEGATIVE
GLUCOSE UA: NEGATIVE
Ketones, UA: NEGATIVE
LEUKOCYTES UA: NEGATIVE
Nitrite, UA: NEGATIVE
ODOR: NEGATIVE
Protein, UA: NEGATIVE
RBC UA: NEGATIVE
Spec Grav, UA: 1.02 (ref 1.010–1.025)
Urobilinogen, UA: 0.2 E.U./dL
pH, UA: 6.5 (ref 5.0–8.0)

## 2017-07-18 LAB — TSH: TSH: 2.37 u[IU]/mL (ref 0.35–4.50)

## 2017-07-18 MED ORDER — LEVOTHYROXINE SODIUM 100 MCG PO TABS: 100.0000 ug | ORAL_TABLET | Freq: Every day | ORAL | 4 refills | Status: DC

## 2017-07-18 MED ORDER — HYDROCHLOROTHIAZIDE 25 MG PO TABS: 25.0000 mg | ORAL_TABLET | Freq: Every day | ORAL | 4 refills | Status: DC

## 2017-07-18 MED ORDER — ESTROGENS, CONJUGATED 0.625 MG/GM VA CREA: TOPICAL_CREAM | VAGINAL | 11 refills | Status: DC

## 2017-07-18 MED ORDER — SUMATRIPTAN 5 MG/ACT NA SOLN: 1.0000 | NASAL | 3 refills | Status: DC | PRN

## 2017-07-18 NOTE — Patient Instructions (Addendum)
Labs today.......Marland Kitchen we will call you if this is anything abnormal  Continue current meds  Continue diet and exercise program  Call your insurance company to find that we get the new shingles vaccine  Return in one year for general physical exam sooner if any problems  The cheapest place to get the Premarin cream is either [facility] or [email or web address]

## 2017-07-18 NOTE — Progress Notes (Signed)
Teresa Patterson is a 77 year old widowed female nonsmoker....... her husband died 3 years ago of dementia..... Who comes in today for annual physical examination because of the following problems  She has history of hypothyroidism on Synthroid 100 g daily. She says she feels fatigue once to no if her thyroid level is normal.  She takes Hydrocort thiazide 25 mg every morning because of a history of varicose veins and peripheral edema  She is Imitrex when necessary for migraine headaches. Last year she only 2 migraines both of which were aborted when she took the Imitrex immediately  Uses Premarin vaginal cream twice weekly because of severe vaginal dryness  She had her uterus and right ovary removed many years ago for nonmalignant reasons.  She gets routine eye care, dental care, BSE monthly, annual mammography, colonoscopy 2011 was normal  Vaccinations up-to-date information given on shingles  Cognitive function normal she walks daily home health safety reviewed no issues identified, no guns in the house, she does have a healthcare power of attorney and living well  Social history..... Widowed lives here in [place]. Her daughter lives next door to her and her sister lives in the same block.  14 point review of systems June otherwise negative  In 2018 she was noted to have a prominent aorta. No bruits. Ultrasound shows normal diameter no AAA  BP 118/84 (BP Location: Left Arm, Patient Position: Sitting, Cuff Size: Normal)   Pulse 66   Temp 98.2 F (36.8 C) (Oral)   Ht 5\' 4"  (1.626 m)   Wt 133 lb (60.3 kg)   BMI 22.83 kg/m  Well-developed well-nourished female no acute distress vital signs stable she's afebrile HEENT were negative neck was supple no adenopathy thyroid normal no carotid bruits. Cardiopulmonary exam normal breast exam normal abdominal exam abdomen is flat bowel sounds are normal. Scar midline from previous cholecystectomy. Scar lower pelvic area from previous TAH and right  oophorectomy. Abdominal exam otherwise negative. Pelvic and rectal not indicated extremities normal skin normal peripheral pulses normal  #1 hypothyroidism ........ check labs  #2 peripheral edema secondary to varicose veins ....... continue diuretic check labs  #3 history of migraine headaches .......Marland Kitchen refill Imitrex  #4 postmenopausal vaginal dryness ........... 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

## 2017-07-23 DIAGNOSIS — R69 Illness, unspecified: Secondary | ICD-10-CM | POA: Diagnosis not present

## 2017-07-25 ENCOUNTER — Other Ambulatory Visit: Payer: Self-pay | Admitting: Family Medicine

## 2017-07-25 DIAGNOSIS — Z1231 Encounter for screening mammogram for malignant neoplasm of breast: Secondary | ICD-10-CM

## 2017-08-14 ENCOUNTER — Ambulatory Visit
Admission: RE | Admit: 2017-08-14 | Discharge: 2017-08-14 | Disposition: A | Discharge status: Home / Self Care | Payer: Medicare HMO | Source: Ambulatory Visit | Attending: Family Medicine | Admitting: Family Medicine

## 2017-08-14 DIAGNOSIS — Z1231 Encounter for screening mammogram for malignant neoplasm of breast: Secondary | ICD-10-CM | POA: Diagnosis not present

## 2017-11-08 DIAGNOSIS — H524 Presbyopia: Secondary | ICD-10-CM | POA: Diagnosis not present

## 2017-11-08 DIAGNOSIS — H25813 Combined forms of age-related cataract, bilateral: Secondary | ICD-10-CM | POA: Diagnosis not present

## 2017-11-29 DIAGNOSIS — H5203 Hypermetropia, bilateral: Secondary | ICD-10-CM | POA: Diagnosis not present

## 2017-11-29 DIAGNOSIS — H52223 Regular astigmatism, bilateral: Secondary | ICD-10-CM | POA: Diagnosis not present

## 2017-11-29 DIAGNOSIS — Z0100 Encounter for examination of eyes and vision without abnormal findings: Secondary | ICD-10-CM | POA: Diagnosis not present

## 2017-11-29 DIAGNOSIS — H1789 Other corneal scars and opacities: Secondary | ICD-10-CM | POA: Diagnosis not present

## 2018-02-04 DIAGNOSIS — R69 Illness, unspecified: Secondary | ICD-10-CM | POA: Diagnosis not present

## 2018-02-15 DIAGNOSIS — S0502XA Injury of conjunctiva and corneal abrasion without foreign body, left eye, initial encounter: Secondary | ICD-10-CM | POA: Diagnosis not present

## 2018-02-15 DIAGNOSIS — R69 Illness, unspecified: Secondary | ICD-10-CM | POA: Diagnosis not present

## 2018-03-04 DIAGNOSIS — D1801 Hemangioma of skin and subcutaneous tissue: Secondary | ICD-10-CM | POA: Diagnosis not present

## 2018-03-04 DIAGNOSIS — L821 Other seborrheic keratosis: Secondary | ICD-10-CM | POA: Diagnosis not present

## 2018-03-04 DIAGNOSIS — L570 Actinic keratosis: Secondary | ICD-10-CM | POA: Diagnosis not present

## 2018-03-14 ENCOUNTER — Ambulatory Visit (INDEPENDENT_AMBULATORY_CARE_PROVIDER_SITE_OTHER): Payer: Medicare HMO | Admitting: Family Medicine

## 2018-03-14 ENCOUNTER — Encounter: Payer: Self-pay | Admitting: Family Medicine

## 2018-03-14 VITALS — BP 114/68 | HR 71 | Temp 98.2°F | Resp 20 | Ht 64.0 in | Wt 136.0 lb

## 2018-03-14 DIAGNOSIS — L989 Disorder of the skin and subcutaneous tissue, unspecified: Secondary | ICD-10-CM | POA: Diagnosis not present

## 2018-03-14 NOTE — Patient Instructions (Addendum)
It was nice to meet you today.  Your hand doe snot appear infected at this time. Warm soak with water/epson salt to cleanse daily, cover with antibacteria ointment and Band-Aid. If becomes swollen, more red or draining--> please be seen immediately.   Please help [place] help you:  We are honored you have chosen [facility] for your Primary Care home. Below you will find basic instructions that you may need to access in the future. Please help [place] help you by reading the instructions, which cover many of the frequent questions we experience.   Prescription refills and request:  -In order to allow more efficient response time, please call your pharmacy for all refills. They will forward the request electronically to [place]. This allows for the quickest possible response. Request left on a nurse line can take longer to refill, since these are checked as time allows between office patients and other phone calls.  - refill request can take up to 3-5 working days to complete.  - If request is sent electronically and request is appropiate, it is usually completed in 1-2 business days.  - all patients will need to be seen routinely for all chronic medical conditions requiring prescription medications (see follow-up below). If you are overdue for follow up on your condition, you will be asked to make an appointment and we will call in enough medication to cover you until your appointment (up to 30 days).  - all controlled substances will require a face to face visit to request/refill.  - if you desire your prescriptions to go through a new pharmacy, and have an active script at original pharmacy, you will need to call your pharmacy and have scripts transferred to new pharmacy. This is completed between the pharmacy locations and not by your provider.    Results: If any images or labs were ordered, it can take up to 1 week to get results depending on the test ordered and the lab/facility running and resulting the  test. - Normal or stable results, which do not need further discussion, may be released to your mychart immediately with attached note to you. A call may not be generated for normal results. Please make certain to sign up for mychart. If you have questions on how to activate your mychart you can call the front office.  - If your results need further discussion, our office will attempt to contact you via phone, and if unable to reach you after 2 attempts, we will release your abnormal result to your mychart with instructions.  - All results will be automatically released in mychart after 1 week.  - Your provider will provide you with explanation and instruction on all relevant material in your results. Please keep in mind, results and labs may appear confusing or abnormal to the untrained eye, but it does not mean they are actually abnormal for you personally. If you have any questions about your results that are not covered, or you desire more detailed explanation than what was provided, you should make an appointment with your provider to do so.   Our office handles many outgoing and incoming calls daily. If we have not contacted you within 1 week about your results, please check your mychart to see if there is a message first and if not, then contact our office.  In helping with this matter, you help decrease call volume, and therefore allow [place] to be able to respond to patients needs more efficiently.   Acute office visits (  sick visit):  An acute visit is intended for a new problem and are scheduled in shorter time slots to allow schedule openings for patients with new problems. This is the appropriate visit to discuss a new problem. Problems will not be addressed by phone call or Echart message. Appointment is needed if requesting treatment. In order to provide you with excellent quality medical care with proper time for you to explain your problem, have an exam and receive treatment with instructions,  these appointments should be limited to one new problem per visit. If you experience a new problem, in which you desire to be addressed, please make an acute office visit, we save openings on the schedule to accommodate you. Please do not save your new problem for any other type of visit, let us take care of it properly and quickly for you.   Follow up visits:  Depending on your condition(s) your provider will need to see you routinely in order to provide you with quality care and prescribe medication(s). Most chronic conditions (Example: hypertension, Diabetes, depression/anxiety... etc), require visits a couple times a year. Your provider will instruct you on proper follow up for your personal medical conditions and history. Please make certain to make follow up appointments for your condition as instructed. Failing to do so could result in lapse in your medication treatment/refills. If you request a refill, and are overdue to be seen on a condition, we will always provide you with a 30 day script (once) to allow you time to schedule.    Medicare wellness (well visit): - we have a wonderful Nurse Maudie Mercury), that will meet with you and provide you will yearly medicare wellness visits. These visits should occur yearly (can not be scheduled less than 1 calendar year apart) and cover preventive health, immunizations, advance directives and screenings you are entitled to yearly through your medicare benefits. Do not miss out on your entitled benefits, this is when medicare will pay for these benefits to be ordered for you.  These are strongly encouraged by your provider and is the appropriate type of visit to make certain you are up to date with all preventive health benefits. If you have not had your medicare wellness exam in the last 12 months, please make certain to schedule one by calling the office and schedule your medicare wellness with Maudie Mercury as soon as possible.   Yearly physical (well visit):  - Adults are  recommended to be seen yearly for physicals. Check with your insurance and date of your last physical, most insurances require one calendar year between physicals. Physicals include all preventive health topics, screenings, medical exam and labs that are appropriate for gender/age and history. You may have fasting labs needed at this visit. This is a well visit (not a sick visit), new problems should not be covered during this visit (see acute visit).  - Pediatric patients are seen more frequently when they are younger. Your provider will advise you on well child visit timing that is appropriate for your their age. - This is not a medicare wellness visit. Medicare wellness exams do not have an exam portion to the visit. Some medicare companies allow for a physical, some do not allow a yearly physical. If your medicare allows a yearly physical you can schedule the medicare wellness with our nurse Maudie Mercury and have your physical with your provider after, on the same day. Please check with insurance for your full benefits.   Late Policy/No Shows:  - all new  patients should arrive 15-30 minutes earlier than appointment to allow [place] time  to  obtain all personal demographics,  insurance information and for you to complete office paperwork. - All established patients should arrive 10-15 minutes earlier than appointment time to update all information and be checked in .  - In our best efforts to run on time, if you are late for your appointment you will be asked to either reschedule or if able, we will work you back into the schedule. There will be a wait time to work you back in the schedule,  depending on availability.  - If you are unable to make it to your appointment as scheduled, please call 24 hours ahead of time to allow [place] to fill the time slot with someone else who needs to be seen. If you do not cancel your appointment ahead of time, you may be charged a no show fee.

## 2018-03-14 NOTE — Progress Notes (Signed)
Patient ID: Teresa Patterson, female  DOB: 07/15/1940, 77 y.o.   MRN: [id number] Patient Care Team    Relationship Specialty Notifications Start End  Ma Hillock, DO PCP - General Family Medicine  03/14/18   Danella Sensing, MD Consulting Physician Dermatology  03/14/18   Richmond Campbell, MD Consulting Physician Gastroenterology  03/14/18   Calvert Cantor, MD Consulting Physician Ophthalmology  03/14/18   Marica Otter, Dent  Optometry  03/14/18   Charlotte Crumb, MD Consulting Physician Orthopedic Surgery  03/14/18     Chief Complaint  Patient presents with  . Establish Care    Subjective:  Teresa Patterson is a 77 y.o.  female present for TOC. All past medical history, surgical history, allergies, family history, immunizations, medications and social history were updated in the electronic medical record today. All recent labs, ED visits and hospitalizations within the last year were reviewed.  Skin lesion:  Pt reports she had a few lesions "frozen" off last week at her dermatologist office (Dr. Wilhemina Bonito )and one of the lesions is red and swollen. She has concerns it is infected. She has been keeping the areas clean and covered with Band-Aid.   Depression screen [facility] 2/9 03/14/2018 07/11/2016 02/08/2015 10/22/2013  Decreased Interest 0 0 0 0  Down, Depressed, Hopeless 0 0 1 0  PHQ - 2 Score 0 0 1 0   No flowsheet data found.  Current Exercise Habits: Structured exercise class, Type of exercise: yoga;walking, Time (Minutes): 60, Frequency (Times/Week): 3, Weekly Exercise (Minutes/Week): 180, Intensity: Mild   Fall Risk  03/14/2018 07/11/2016 02/08/2015 10/22/2013  Falls in the past year? No No No No     Immunization History  Administered Date(s) Administered  . H1N1 06/11/2008  . Influenza Split 03/30/2011  . Influenza Whole 06/04/2007, 02/14/2008, 06/16/2009  . Influenza, High Dose Seasonal PF 02/08/2015, 01/30/2017, 02/15/2018  . Influenza,inj,Quad PF,6+ Mos 02/14/2013,  01/08/2014  . Influenza-Unspecified 02/24/2016, 02/15/2018  . Pneumococcal Conjugate-13 10/22/2013  . Pneumococcal Polysaccharide-23 05/17/2006  . Td 03/28/2005, 07/11/2016    No exam data present  Past Medical History:  Diagnosis Date  . Colon polyp   . Hypothyroid    hypothyroid  . Migraine   . Multiple dysplastic nevi    established with Dr. Wilhemina Bonito dermatology   No Known Allergies Past Surgical History:  Procedure Laterality Date  . ABDOMINAL HYSTERECTOMY  1972  . CHOLECYSTECTOMY  1995  . OVARIAN CYST REMOVAL Right    right  . TONSILLECTOMY AND ADENOIDECTOMY  1952   Family History  Problem Relation Age of Onset  . Brain cancer Brother        BRAIN TUMOR  . Cancer Maternal Uncle        ?   Social History   Socioeconomic History  . Marital status: Widowed    Spouse name: Not on file  . Number of children: Not on file  . Years of education: Not on file  . Highest education level: Not on file  Occupational History  . Not on file  Social Needs  . Financial resource strain: Not on file  . Food insecurity:    Worry: Not on file    Inability: Not on file  . Transportation needs:    Medical: Not on file    Non-medical: Not on file  Tobacco Use  . Smoking status: Never Smoker  . Smokeless tobacco: Never Used  Substance and Sexual Activity  . Alcohol use: No  Alcohol/week: 0.0 standard drinks  . Drug use: No  . Sexual activity: Not Currently    Partners: Male  Lifestyle  . Physical activity:    Days per week: Not on file    Minutes per session: Not on file  . Stress: Not on file  Relationships  . Social connections:    Talks on phone: Not on file    Gets together: Not on file    Attends religious service: Not on file    Active member of club or organization: Not on file    Attends meetings of clubs or organizations: Not on file    Relationship status: Not on file  . Intimate partner violence:    Fear of current or ex partner: Not on file     Emotionally abused: Not on file    Physically abused: Not on file    Forced sexual activity: Not on file  Other Topics Concern  . Not on file  Social History Narrative   Marital status/children/pets: Widowed. 3 children. Lives alone.    Education/employment: GED, retired.    Safety:      -smoke alarm in the home:Yes     - wears seatbelt: Yes     - Feels safe in their surroundings: Yes   Allergies as of 03/14/2018   No Known Allergies     Medication List        Accurate as of 03/14/18  3:08 PM. Always use your most recent med list.          calcium citrate-vitamin D 315-200 MG-UNIT tablet Commonly known as:  CITRACAL+D Take 1 tablet by mouth 2 (two) times daily.   Co Q 10 100 MG Caps Take 1 capsule by mouth daily.   conjugated estrogens vaginal cream Commonly known as:  PREMARIN Apply small amounts twice weekly   hydrochlorothiazide 25 MG tablet Commonly known as:  HYDRODIURIL Take 1 tablet (25 mg total) by mouth daily.   levothyroxine 100 MCG tablet Commonly known as:  SYNTHROID, LEVOTHROID Take 1 tablet (100 mcg total) by mouth daily.   multivitamin tablet Take 1 tablet by mouth daily.   SUMAtriptan 5 MG/ACT nasal spray Commonly known as:  IMITREX Place 1 spray (5 mg total) into the nose every 2 (two) hours as needed.   vitamin C 100 MG tablet Take 100 mg by mouth daily.       All past medical history, surgical history, allergies, family history, immunizations andmedications were updated in the EMR today and reviewed under the history and medication portions of their EMR.    No results found for this or any previous visit (from the past 2160 hour(s)).   ROS: 14 pt review of systems performed and negative (unless mentioned in an HPI)  Objective: BP 114/68 (BP Location: Right Arm, Patient Position: Sitting, Cuff Size: Normal)   Pulse 71   Temp 98.2 F (36.8 C)   Resp 20   Ht 5\' 4"  (1.626 m)   Wt 136 lb (61.7 kg)   SpO2 100%   BMI 23.34 kg/m    Gen: Afebrile. No acute distress. Nontoxic in appearance, well-developed, well-nourished,  Pleasant caucasian female.  HENT: AT. [place].  MMM Eyes:Pupils Equal Round Reactive to light, Extraocular movements intact,  Conjunctiva without redness, discharge or icterus. Skin: no rashes, purpura or petechiae. Right 1st distal metacarpal head with scabbed over lesion, mild redness present, no drainage.  Neuro/Msk: Normal gait. PERLA. EOMi. Alert. Oriented x3.   Psych: Normal affect, dress and  demeanor. Normal speech. Normal thought content and judgment.  Assessment/plan: Teresa Patterson is a 77 y.o. female present for TOC, from Dr. Sherren Mocha (retiring) Skin lesion - Does not appear infected today. Mildly swollen and red, more irritation than infection-possibly from location directly over the joint. - warm compress (epson salt soaks) cleanses daily, avoid standing water, keep covered with abx oint and bandaid. Monitor for signs of infection.     Return if symptoms worsen or fail to improve. End of Feb 2020 for CPE  Chart updated today for transfer of care.   > 25 minutes spent with patient, >50% of time spent face to face transferring care, updating building chart and acute visit.    Note is dictated utilizing voice recognition software. Although note has been proof read prior to signing, occasional typographical errors still can be missed. If any questions arise, please do not hesitate to call for verification.  Electronically signed by: Howard Pouch, DO [place]

## 2018-07-11 ENCOUNTER — Other Ambulatory Visit: Payer: Self-pay | Admitting: Family Medicine

## 2018-07-11 DIAGNOSIS — Z1231 Encounter for screening mammogram for malignant neoplasm of breast: Secondary | ICD-10-CM

## 2018-07-15 ENCOUNTER — Encounter: Payer: Medicare HMO | Admitting: Family Medicine

## 2018-07-25 ENCOUNTER — Ambulatory Visit (INDEPENDENT_AMBULATORY_CARE_PROVIDER_SITE_OTHER): Payer: Medicare HMO | Admitting: Family Medicine

## 2018-07-25 ENCOUNTER — Encounter: Payer: Self-pay | Admitting: Family Medicine

## 2018-07-25 VITALS — BP 109/70 | HR 69 | Temp 97.5°F | Resp 16 | Ht 64.0 in | Wt 136.2 lb

## 2018-07-25 DIAGNOSIS — E039 Hypothyroidism, unspecified: Secondary | ICD-10-CM

## 2018-07-25 DIAGNOSIS — Z0000 Encounter for general adult medical examination without abnormal findings: Secondary | ICD-10-CM | POA: Diagnosis not present

## 2018-07-25 DIAGNOSIS — G43009 Migraine without aura, not intractable, without status migrainosus: Secondary | ICD-10-CM

## 2018-07-25 DIAGNOSIS — J302 Other seasonal allergic rhinitis: Secondary | ICD-10-CM | POA: Insufficient documentation

## 2018-07-25 DIAGNOSIS — Z780 Asymptomatic menopausal state: Secondary | ICD-10-CM | POA: Diagnosis not present

## 2018-07-25 DIAGNOSIS — Z131 Encounter for screening for diabetes mellitus: Secondary | ICD-10-CM

## 2018-07-25 DIAGNOSIS — M810 Age-related osteoporosis without current pathological fracture: Secondary | ICD-10-CM | POA: Insufficient documentation

## 2018-07-25 DIAGNOSIS — Z130 Encounter for screening for diseases of the blood and blood-forming organs and certain disorders involving the immune mechanism: Secondary | ICD-10-CM | POA: Diagnosis not present

## 2018-07-25 DIAGNOSIS — Z79899 Other long term (current) drug therapy: Secondary | ICD-10-CM

## 2018-07-25 DIAGNOSIS — G43809 Other migraine, not intractable, without status migrainosus: Secondary | ICD-10-CM | POA: Diagnosis not present

## 2018-07-25 DIAGNOSIS — R609 Edema, unspecified: Secondary | ICD-10-CM | POA: Insufficient documentation

## 2018-07-25 DIAGNOSIS — H25813 Combined forms of age-related cataract, bilateral: Secondary | ICD-10-CM | POA: Diagnosis not present

## 2018-07-25 LAB — LIPID PANEL
CHOLESTEROL: 165 mg/dL (ref 0–200)
HDL: 68.2 mg/dL (ref >39.00)
LDL CALC: 80 mg/dL (ref 0–99)
NonHDL: 96.31
TRIGLYCERIDES: 84 mg/dL (ref 0.0–149.0)
Total CHOL/HDL Ratio: 2
VLDL: 16.8 mg/dL (ref 0.0–40.0)

## 2018-07-25 LAB — COMPREHENSIVE METABOLIC PANEL
ALBUMIN: 4.1 g/dL (ref 3.5–5.2)
ALT: 7 U/L (ref 0–35)
AST: 12 U/L (ref 0–37)
Alkaline Phosphatase: 50 U/L (ref 39–117)
BUN: 12 mg/dL (ref 6–23)
CALCIUM: 10.4 mg/dL (ref 8.4–10.5)
CHLORIDE: 103 meq/L (ref 96–112)
CO2: 32 mEq/L (ref 19–32)
Creatinine, Ser: 0.86 mg/dL (ref 0.40–1.20)
GFR: 63.79 mL/min (ref >60.00)
Glucose, Bld: 85 mg/dL (ref 70–99)
POTASSIUM: 4.2 meq/L (ref 3.5–5.1)
Sodium: 140 mEq/L (ref 135–145)
Total Bilirubin: 0.5 mg/dL (ref 0.2–1.2)
Total Protein: 6.4 g/dL (ref 6.0–8.3)

## 2018-07-25 LAB — TSH: TSH: 1.6 u[IU]/mL (ref 0.35–4.50)

## 2018-07-25 LAB — CBC
HEMATOCRIT: 35 % — AB (ref 36.0–46.0)
Hemoglobin: 11.4 g/dL — ABNORMAL LOW (ref 12.0–15.0)
MCHC: 32.6 g/dL (ref 30.0–36.0)
MCV: 81.5 fl (ref 78.0–100.0)
PLATELETS: 190 10*3/uL (ref 150.0–400.0)
RBC: 4.29 Mil/uL (ref 3.87–5.11)
RDW: 16.5 % — ABNORMAL HIGH (ref 11.5–15.5)
WBC: 6.1 10*3/uL (ref 4.0–10.5)

## 2018-07-25 LAB — HEMOGLOBIN A1C: Hgb A1c MFr Bld: 5.7 % (ref 4.6–6.5)

## 2018-07-25 MED ORDER — HYDROCHLOROTHIAZIDE 25 MG PO TABS: 25.0000 mg | ORAL_TABLET | Freq: Every day | ORAL | 4 refills | Status: DC

## 2018-07-25 MED ORDER — ZOSTER VAC RECOMB ADJUVANTED 50 MCG/0.5ML IM SUSR: 0.5000 mL | Freq: Once | INTRAMUSCULAR | 1 refills | Status: AC

## 2018-07-25 MED ORDER — FLUTICASONE PROPIONATE 50 MCG/ACT NA SUSP: 2.0000 | Freq: Every day | NASAL | 6 refills | Status: DC

## 2018-07-25 MED ORDER — SUMATRIPTAN 5 MG/ACT NA SOLN: 1.0000 | NASAL | 3 refills | Status: DC | PRN

## 2018-07-25 MED ORDER — ESTROGENS, CONJUGATED 0.625 MG/GM VA CREA: TOPICAL_CREAM | VAGINAL | 11 refills | Status: DC

## 2018-07-25 NOTE — Progress Notes (Signed)
Patient ID: Teresa Patterson, female  DOB: 18-Oct-1940, 78 y.o.   MRN: [id number] Patient Care Team    Relationship Specialty Notifications Start End  Ma Hillock, DO PCP - General Family Medicine  03/14/18   Danella Sensing, MD Consulting Physician Dermatology  03/14/18   Richmond Campbell, MD Consulting Physician Gastroenterology  03/14/18   Calvert Cantor, MD Consulting Physician Ophthalmology  03/14/18   Marica Otter, [place]  Optometry  03/14/18   Charlotte Crumb, MD Consulting Physician Orthopedic Surgery  03/14/18     Chief Complaint  Patient presents with  . Annual Exam    Fasting. Pt has had sinus drainage x3 weeks, drains down throat and feels like pt is choking     Subjective:  Teresa Patterson is a 78 y.o.  Female  present for CPE. All past medical history, surgical history, allergies, family history, immunizations, medications and social history were updated in the electronic medical record today. All recent labs, ED visits and hospitalizations within the last year were reviewed.  Hypothyroidism, unspecified type Asymptomatic. Reports compliance with synthroid 100 mcg daily.   Osteoporosis, unspecified osteoporosis type, unspecified pathological fracture presence Due for repeat 2021- taking calcium and vit d  postmenopausal changes:  Uses premarin cream 2x week.   migraine without status migrainosus, not intractable Uses imitrex as needed- rarely needs.   Edema:  HCTZ 25 mg QD used for edema, BP has always been controlled.   Sinus drainage:  Patient reports she has had sinus drainage for about 3 weeks that is causing postnasal drip.  She denies fever, chills, nausea, vomit.  Denies headache or sinus pressure.  She is not taking any medications for comfort.  Health maintenance:  Colonoscopy: > 75 N/A Mammogram: completed:07/2016, scheduled 08/20/2018 Cervical cancer screening: > 65 n/a Immunizations: tdap UTD 2018, Influenza 2019 UTD (encouraged yearly), PNA  series completed (after 65), shingrix printed today Infectious disease screening: N/A DEXA: last completed 01/2015, -1.9, follow needed 2021 Assistive device: none Oxygen QHU:TMLY Patient has a Dental home. Hospitalizations/ED visits: reviewed   Depression screen [facility] 2/9 07/25/2018 03/14/2018 07/11/2016 02/08/2015 10/22/2013  Decreased Interest 0 0 0 0 0  Down, Depressed, Hopeless 0 0 0 1 0  PHQ - 2 Score 0 0 0 1 0   No flowsheet data found.   Immunization History  Administered Date(s) Administered  . H1N1 06/11/2008  . Influenza Split 03/30/2011  . Influenza Whole 06/04/2007, 02/14/2008, 06/16/2009  . Influenza, High Dose Seasonal PF 02/08/2015, 01/30/2017, 02/15/2018  . Influenza,inj,Quad PF,6+ Mos 02/14/2013, 01/08/2014  . Influenza-Unspecified 02/24/2016, 02/15/2018  . Pneumococcal Conjugate-13 10/22/2013  . Pneumococcal Polysaccharide-23 05/17/2006  . Td 03/28/2005, 07/11/2016     Past Medical History:  Diagnosis Date  . Colon polyp   . Hypothyroid    hypothyroid  . Migraine   . Multiple dysplastic nevi    established with Dr. Wilhemina Bonito dermatology   No Known Allergies Past Surgical History:  Procedure Laterality Date  . ABDOMINAL HYSTERECTOMY  1972  . CHOLECYSTECTOMY  1995  . OVARIAN CYST REMOVAL Right    right  . TONSILLECTOMY AND ADENOIDECTOMY  1952   Family History  Problem Relation Age of Onset  . Brain cancer Brother        BRAIN TUMOR  . Cancer Maternal Uncle        ?   Social History   Social History Narrative   Marital status/children/pets: Widowed. 3 children. Lives alone.    Education/employment: GED, retired.  Safety:      -smoke alarm in the home:Yes     - wears seatbelt: Yes     - Feels safe in their surroundings: Yes    Allergies as of 07/25/2018   No Known Allergies     Medication List       Accurate as of July 25, 2018  9:43 AM. Always use your most recent med list.        calcium citrate-vitamin D 315-200 MG-UNIT  tablet Commonly known as:  CITRACAL+D Take 1 tablet by mouth 2 (two) times daily.   Co Q 10 100 MG Caps Take 1 capsule by mouth daily.   conjugated estrogens vaginal cream Commonly known as:  PREMARIN Apply small amounts twice weekly   fluticasone 50 MCG/ACT nasal spray Commonly known as:  FLONASE Place 2 sprays into both nostrils daily.   hydrochlorothiazide 25 MG tablet Commonly known as:  HYDRODIURIL Take 1 tablet (25 mg total) by mouth daily.   levothyroxine 100 MCG tablet Commonly known as:  SYNTHROID, LEVOTHROID Take 1 tablet (100 mcg total) by mouth daily.   multivitamin tablet Take 1 tablet by mouth daily.   SUMAtriptan 5 MG/ACT nasal spray Commonly known as:  IMITREX Place 1 spray (5 mg total) into the nose every 2 (two) hours as needed.   vitamin C 100 MG tablet Take 100 mg by mouth daily.   Zoster Vaccine Adjuvanted injection Commonly known as:  SHINGRIX Inject 0.5 mLs into the muscle once for 1 dose. Repeat dose in 2-6 months.       All past medical history, surgical history, allergies, family history, immunizations andmedications were updated in the EMR today and reviewed under the history and medication portions of their EMR.     No results found for this or any previous visit (from the past 2160 hour(s)).  Mm Screening Breast Tomo Bilateral  Result Date: 08/15/2017 CLINICAL DATA:  Screening. EXAM: DIGITAL SCREENING BILATERAL MAMMOGRAM WITH TOMO AND CAD COMPARISON:  Previous exam(s). ACR Breast Density Category b: There are scattered areas of fibroglandular density. FINDINGS: There are no findings suspicious for malignancy. Images were processed with CAD. IMPRESSION: No mammographic evidence of malignancy. A result letter of this screening mammogram will be mailed directly to the patient. RECOMMENDATION: Screening mammogram in one year. (Code:SM-B-01Y) BI-RADS CATEGORY  1: Negative. Electronically Signed   By: Ammie Ferrier M.D.   On: 08/15/2017 09:47     ROS: 14 pt review of systems performed and negative (unless mentioned in an HPI)  Objective: BP 109/70 (BP Location: Right Arm, Patient Position: Sitting, Cuff Size: Normal)   Pulse 69   Temp (!) 97.5 F (36.4 C) (Oral)   Resp 16   Ht 5\' 4"  (1.626 m)   Wt 136 lb 4 oz (61.8 kg)   SpO2 98%   BMI 23.39 kg/m  Gen: Afebrile. No acute distress. Nontoxic in appearance, well-developed, well-nourished, pleasant, Caucasian female. HENT: AT. [place]. Bilateral TM visualized and normal in appearance, normal external auditory canal. MMM, no oral lesions, adequate dentition. Bilateral nares within normal limits. Throat without erythema, ulcerations or exudates.  No cough on exam, no hoarseness on exam.  Mild postnasal drip. Eyes:Pupils Equal Round Reactive to light, Extraocular movements intact,  Conjunctiva without redness, discharge or icterus. Neck/lymp/endocrine: Supple, no lymphadenopathy, no thyromegaly CV: RRR murmur, no edema, +2/4 P posterior tibialis pulses.  No carotid bruits. No JVD. Chest: CTAB, no wheeze, rhonchi or crackles.  Normal respiratory effort.  Good air movement. Abd:  Soft.  Flat. NTND. BS present.  No masses palpated. No hepatosplenomegaly. No rebound tenderness or guarding. Skin: No rashes, purpura or petechiae. Warm and well-perfused. Skin intact. Neuro/Msk:  Normal gait. PERLA. EOMi. Alert. Oriented x3.  Cranial nerves II through XII intact. Muscle strength 5/5 upper/lower extremity. DTRs equal bilaterally. Psych: Normal affect, dress and demeanor. Normal speech. Normal thought content and judgment.   No exam data present  Assessment/plan: Teresa Patterson is a 78 y.o. female present for CPE. Hypothyroidism, unspecified type TSH, Lipid - refills wll be provided after resutls.   Osteoporosis, unspecified osteoporosis type, unspecified pathological fracture presence Due for repeat 2021- taking calcium and vit d  postmenopausal changes:  Uses premarin cream 2x week.   - refills provided today  migraine without status migrainosus, not intractable Uses imitrex as needed- rarely needs.  Refills provided today  Edema:  -  used for edema, BP has always been controlled.  Refills provided on HCTZ (ok to follow yearly)  Seasonal allergies:  -Exam consistent with mild allergy symptoms.  Encouraged her to start Flonase 2 spray daily, this was prescribed for her.  May also need to consider starting a daily oral antihistamine.  No signs of infection today.  Screening for deficiency anemia - CBC Encounter for long-term current use of medication - Comprehensive metabolic panel Diabetes mellitus screening - Hemoglobin A1c Encounter for preventive health examination Patient was encouraged to exercise greater than 150 minutes a week. Patient was encouraged to choose a diet filled with fresh fruits and vegetables, and lean meats. AVS provided to patient today for education/recommendation on gender specific health and safety maintenance. Colonoscopy: > 75 N/A Mammogram: completed:07/2016, scheduled 08/20/2018 Cervical cancer screening: > 65 n/a Immunizations: tdap UTD 2018, Influenza 2019 UTD (encouraged yearly), PNA series completed (after 65), shingrix printed today Infectious disease screening: N/A DEXA: last completed 01/2015, -1.9, follow needed 2021  Return in about 1 year (around 07/25/2019) for CPE.  Electronically signed by: Howard Pouch, DO [place]

## 2018-07-25 NOTE — Patient Instructions (Addendum)
Refilled meds today.  I will call the synthroid tomorrow after results.  Start Flonase nasal spray daily- your exam looked like allergies is the cause.   Health Maintenance After Age 78 After age 38, you are at a higher risk for certain long-term diseases and infections as well as injuries from falls. Falls are a major cause of broken bones and head injuries in people who are older than age 74. Getting regular preventive care can help to keep you healthy and well. Preventive care includes getting regular testing and making lifestyle changes as recommended by your health care provider. Talk with your health care provider about:  Which screenings and tests you should have. A screening is a test that checks for a disease when you have no symptoms.  A diet and exercise plan that is right for you. What should I know about screenings and tests to prevent falls? Screening and testing are the best ways to find a health problem early. Early diagnosis and treatment give you the best chance of managing medical conditions that are common after age 107. Certain conditions and lifestyle choices may make you more likely to have a fall. Your health care provider may recommend:  Regular vision checks. Poor vision and conditions such as cataracts can make you more likely to have a fall. If you wear glasses, make sure to get your prescription updated if your vision changes.  Medicine review. Work with your health care provider to regularly review all of the medicines you are taking, including over-the-counter medicines. Ask your health care provider about any side effects that may make you more likely to have a fall. Tell your health care provider if any medicines that you take make you feel dizzy or sleepy.  Osteoporosis screening. Osteoporosis is a condition that causes the bones to get weaker. This can make the bones weak and cause them to break more easily.  Blood pressure screening. Blood pressure changes and  medicines to control blood pressure can make you feel dizzy.  Strength and balance checks. Your health care provider may recommend certain tests to check your strength and balance while standing, walking, or changing positions.  Foot health exam. Foot pain and numbness, as well as not wearing proper footwear, can make you more likely to have a fall.  Depression screening. You may be more likely to have a fall if you have a fear of falling, feel emotionally low, or feel unable to do activities that you used to do.  Alcohol use screening. Using too much alcohol can affect your balance and may make you more likely to have a fall. What actions can I take to lower my risk of falls? General instructions  Talk with your health care provider about your risks for falling. Tell your health care provider if: ? You fall. Be sure to tell your health care provider about all falls, even ones that seem minor. ? You feel dizzy, sleepy, or off-balance.  Take over-the-counter and prescription medicines only as told by your health care provider. These include any supplements.  Eat a healthy diet and maintain a healthy weight. A healthy diet includes low-fat dairy products, low-fat (lean) meats, and fiber from whole grains, beans, and lots of fruits and vegetables. Home safety  Remove any tripping hazards, such as rugs, cords, and clutter.  Install safety equipment such as grab bars in bathrooms and safety rails on stairs.  Keep rooms and walkways well-lit. Activity   Follow a regular exercise program to stay  fit. This will help you maintain your balance. Ask your health care provider what types of exercise are appropriate for you.  If you need a cane or walker, use it as recommended by your health care provider.  Wear supportive shoes that have nonskid soles. Lifestyle  Do not drink alcohol if your health care provider tells you not to drink.  If you drink alcohol, limit how much you have: ? 0-1  drink a day for women. ? 0-2 drinks a day for men.  Be aware of how much alcohol is in your drink. In the [place] one drink equals one typical bottle of beer (12 oz), one-half glass of wine (5 oz), or one shot of hard liquor (1 oz).  Do not use any products that contain nicotine or tobacco, such as cigarettes and e-cigarettes. If you need help quitting, ask your health care provider. Summary  Having a healthy lifestyle and getting preventive care can help to protect your health and wellness after age 66.  Screening and testing are the best way to find a health problem early and help you avoid having a fall. Early diagnosis and treatment give you the best chance for managing medical conditions that are more common for people who are older than age 26.  Falls are a major cause of broken bones and head injuries in people who are older than age 39. Take precautions to prevent a fall at home.  Work with your health care provider to learn what changes you can make to improve your health and wellness and to prevent falls. This information is not intended to replace advice given to you by your health care provider. Make sure you discuss any questions you have with your health care provider. Document Released: 03/21/2017 Document Revised: 03/21/2017 Document Reviewed: 03/21/2017 Elsevier Interactive Patient Education  2019 [employer].

## 2018-07-26 ENCOUNTER — Telehealth: Payer: Self-pay | Admitting: Family Medicine

## 2018-07-26 MED ORDER — LEVOTHYROXINE SODIUM 100 MCG PO TABS: 100.0000 ug | ORAL_TABLET | Freq: Every day | ORAL | 4 refills | Status: DC

## 2018-07-26 NOTE — Telephone Encounter (Signed)
Called pt and LM on VM to return call to obtain lab results   Okay for PEC to discuss results/PCP recommendations.

## 2018-07-26 NOTE — Telephone Encounter (Signed)
Pt called and was given lab results by [facility] triage RN.

## 2018-07-26 NOTE — Telephone Encounter (Signed)
Pt returned call and lab message given to her with verbal understanding.

## 2018-07-26 NOTE — Telephone Encounter (Signed)
Please inform patient the following information: Her labs are all normal with the exception of very mild anemia with hgb 11.4 (NL 12.0). In reviewing her records, she has a similar results 2 years ago.  Recs: This is very very mild, as long as she is not experiencing any bleeding with stools/bowel changes which can be the cause,-->  I would encourage her to increase iron rich foods- or consider starting a low dose iron supplement (some can cause constipation- so watch for signs of this and take a stool softner if necessary).  Of course if she is noticing stool changes/bleedign then we need to get her to her GI.  I have refilled her synthroid at same dose, All other meds called in yesterday.

## 2018-08-05 ENCOUNTER — Encounter: Payer: Medicare HMO | Admitting: Family Medicine

## 2018-08-20 ENCOUNTER — Ambulatory Visit: Payer: Medicare HMO

## 2018-09-20 ENCOUNTER — Ambulatory Visit: Payer: Medicare HMO

## 2018-10-31 DIAGNOSIS — H2511 Age-related nuclear cataract, right eye: Secondary | ICD-10-CM | POA: Diagnosis not present

## 2018-11-05 ENCOUNTER — Other Ambulatory Visit: Payer: Self-pay

## 2018-11-05 ENCOUNTER — Ambulatory Visit
Admission: RE | Admit: 2018-11-05 | Discharge: 2018-11-05 | Disposition: A | Discharge status: Home / Self Care | Payer: Medicare HMO | Source: Ambulatory Visit | Attending: Family Medicine | Admitting: Family Medicine

## 2018-11-05 DIAGNOSIS — Z1231 Encounter for screening mammogram for malignant neoplasm of breast: Secondary | ICD-10-CM | POA: Diagnosis not present

## 2018-11-11 DIAGNOSIS — H25811 Combined forms of age-related cataract, right eye: Secondary | ICD-10-CM | POA: Diagnosis not present

## 2018-11-11 DIAGNOSIS — H2511 Age-related nuclear cataract, right eye: Secondary | ICD-10-CM | POA: Diagnosis not present

## 2018-11-21 DIAGNOSIS — H2512 Age-related nuclear cataract, left eye: Secondary | ICD-10-CM | POA: Diagnosis not present

## 2018-12-02 DIAGNOSIS — H2512 Age-related nuclear cataract, left eye: Secondary | ICD-10-CM | POA: Diagnosis not present

## 2018-12-02 DIAGNOSIS — H25812 Combined forms of age-related cataract, left eye: Secondary | ICD-10-CM | POA: Diagnosis not present

## 2018-12-09 DIAGNOSIS — R69 Illness, unspecified: Secondary | ICD-10-CM | POA: Diagnosis not present

## 2019-01-16 DIAGNOSIS — Z961 Presence of intraocular lens: Secondary | ICD-10-CM | POA: Diagnosis not present

## 2019-02-20 DIAGNOSIS — R69 Illness, unspecified: Secondary | ICD-10-CM | POA: Diagnosis not present

## 2019-03-05 DIAGNOSIS — L570 Actinic keratosis: Secondary | ICD-10-CM | POA: Diagnosis not present

## 2019-03-05 DIAGNOSIS — L821 Other seborrheic keratosis: Secondary | ICD-10-CM | POA: Diagnosis not present

## 2019-06-18 ENCOUNTER — Ambulatory Visit: Payer: Medicare HMO

## 2019-06-26 ENCOUNTER — Ambulatory Visit: Payer: Medicare HMO | Attending: Internal Medicine

## 2019-06-26 DIAGNOSIS — Z23 Encounter for immunization: Secondary | ICD-10-CM | POA: Insufficient documentation

## 2019-06-26 NOTE — Progress Notes (Signed)
   Covid-19 Vaccination Clinic  Name:  Teresa Patterson    MRN: [id number] DOB: Jul 20, 1940  06/26/2019  Teresa Patterson was observed post Covid-19 immunization for 15 minutes without incidence. She was provided with Vaccine Information Sheet and instruction to access the V-Safe system.   Teresa Patterson was instructed to call 911 with any severe reactions post vaccine: Marland Kitchen Difficulty breathing  . Swelling of your face and throat  . A fast heartbeat  . A bad rash all over your body  . Dizziness and weakness    Immunizations Administered    Name Date Dose VIS Date Route   Pfizer COVID-19 Vaccine 06/26/2019  4:22 PM 0.3 mL 05/02/2019 Intramuscular   Manufacturer: [place]   Lot: [id number]   [place]: [id number]

## 2019-07-15 DIAGNOSIS — R69 Illness, unspecified: Secondary | ICD-10-CM | POA: Diagnosis not present

## 2019-07-22 ENCOUNTER — Ambulatory Visit: Payer: Medicare HMO | Attending: Internal Medicine

## 2019-07-22 DIAGNOSIS — Z23 Encounter for immunization: Secondary | ICD-10-CM | POA: Insufficient documentation

## 2019-07-22 NOTE — Progress Notes (Signed)
   Covid-19 Vaccination Clinic  Name:  Teresa Patterson    MRN: [id number] DOB: [birth year]  07/22/2019  [name] was observed post Covid-19 immunization for 15 minutes without incident. She was provided with Vaccine Information Sheet and instruction to access the V-Safe system.   [name] was instructed to call 911 with any severe reactions post vaccine: Marland Kitchen Difficulty breathing  . Swelling of face and throat  . A fast heartbeat  . A bad rash all over body  . Dizziness and weakness   Immunizations Administered    Name Date Dose VIS Date Route   Pfizer COVID-19 Vaccine 07/22/2019 12:56 PM 0.3 mL 05/02/2019 Intramuscular   Manufacturer: [place]   Lot: [id number]   [place]: [id number]

## 2019-07-24 DIAGNOSIS — H26493 Other secondary cataract, bilateral: Secondary | ICD-10-CM | POA: Diagnosis not present

## 2019-07-24 DIAGNOSIS — Z961 Presence of intraocular lens: Secondary | ICD-10-CM | POA: Diagnosis not present

## 2019-07-29 ENCOUNTER — Encounter: Payer: Self-pay | Admitting: Family Medicine

## 2019-07-29 ENCOUNTER — Other Ambulatory Visit (HOSPITAL_COMMUNITY)
Admission: RE | Admit: 2019-07-29 | Discharge: 2019-07-29 | Disposition: A | Discharge status: Home / Self Care | Payer: Medicare HMO | Source: Ambulatory Visit | Attending: Family Medicine | Admitting: Family Medicine

## 2019-07-29 ENCOUNTER — Ambulatory Visit (INDEPENDENT_AMBULATORY_CARE_PROVIDER_SITE_OTHER): Payer: Medicare HMO | Admitting: Family Medicine

## 2019-07-29 ENCOUNTER — Other Ambulatory Visit: Payer: Self-pay

## 2019-07-29 VITALS — BP 129/80 | HR 67 | Temp 98.0°F | Resp 16 | Ht 64.1 in | Wt 137.1 lb

## 2019-07-29 DIAGNOSIS — Z131 Encounter for screening for diabetes mellitus: Secondary | ICD-10-CM | POA: Diagnosis not present

## 2019-07-29 DIAGNOSIS — R32 Unspecified urinary incontinence: Secondary | ICD-10-CM | POA: Insufficient documentation

## 2019-07-29 DIAGNOSIS — E039 Hypothyroidism, unspecified: Secondary | ICD-10-CM | POA: Diagnosis not present

## 2019-07-29 DIAGNOSIS — R609 Edema, unspecified: Secondary | ICD-10-CM | POA: Diagnosis not present

## 2019-07-29 DIAGNOSIS — M810 Age-related osteoporosis without current pathological fracture: Secondary | ICD-10-CM

## 2019-07-29 DIAGNOSIS — Z1231 Encounter for screening mammogram for malignant neoplasm of breast: Secondary | ICD-10-CM

## 2019-07-29 DIAGNOSIS — Z79899 Other long term (current) drug therapy: Secondary | ICD-10-CM

## 2019-07-29 DIAGNOSIS — G43009 Migraine without aura, not intractable, without status migrainosus: Secondary | ICD-10-CM | POA: Diagnosis not present

## 2019-07-29 DIAGNOSIS — Z780 Asymptomatic menopausal state: Secondary | ICD-10-CM

## 2019-07-29 DIAGNOSIS — N898 Other specified noninflammatory disorders of vagina: Secondary | ICD-10-CM

## 2019-07-29 DIAGNOSIS — Z0000 Encounter for general adult medical examination without abnormal findings: Secondary | ICD-10-CM | POA: Diagnosis not present

## 2019-07-29 DIAGNOSIS — D649 Anemia, unspecified: Secondary | ICD-10-CM | POA: Diagnosis not present

## 2019-07-29 DIAGNOSIS — Z0001 Encounter for general adult medical examination with abnormal findings: Secondary | ICD-10-CM

## 2019-07-29 LAB — COMPREHENSIVE METABOLIC PANEL
ALT: 8 U/L (ref 0–35)
AST: 14 U/L (ref 0–37)
Albumin: 4 g/dL (ref 3.5–5.2)
Alkaline Phosphatase: 55 U/L (ref 39–117)
BUN: 13 mg/dL (ref 6–23)
CO2: 29 mEq/L (ref 19–32)
Calcium: 10 mg/dL (ref 8.4–10.5)
Chloride: 103 mEq/L (ref 96–112)
Creatinine, Ser: 0.9 mg/dL (ref 0.40–1.20)
GFR: 60.37 mL/min (ref >60.00)
Glucose, Bld: 85 mg/dL (ref 70–99)
Potassium: 4 mEq/L (ref 3.5–5.1)
Sodium: 138 mEq/L (ref 135–145)
Total Bilirubin: 0.6 mg/dL (ref 0.2–1.2)
Total Protein: 6.7 g/dL (ref 6.0–8.3)

## 2019-07-29 LAB — CBC
HCT: 34.8 % — ABNORMAL LOW (ref 36.0–46.0)
Hemoglobin: 11.5 g/dL — ABNORMAL LOW (ref 12.0–15.0)
MCHC: 33 g/dL (ref 30.0–36.0)
MCV: 80.3 fl (ref 78.0–100.0)
Platelets: 193 10*3/uL (ref 150.0–400.0)
RBC: 4.34 Mil/uL (ref 3.87–5.11)
RDW: 15.8 % — ABNORMAL HIGH (ref 11.5–15.5)
WBC: 4.6 10*3/uL (ref 4.0–10.5)

## 2019-07-29 LAB — LIPID PANEL
Cholesterol: 168 mg/dL (ref 0–200)
HDL: 61.3 mg/dL (ref >39.00)
LDL Cholesterol: 88 mg/dL (ref 0–99)
NonHDL: 106.4
Total CHOL/HDL Ratio: 3
Triglycerides: 94 mg/dL (ref 0.0–149.0)
VLDL: 18.8 mg/dL (ref 0.0–40.0)

## 2019-07-29 LAB — TSH: TSH: 2.62 u[IU]/mL (ref 0.35–4.50)

## 2019-07-29 LAB — HEMOGLOBIN A1C: Hgb A1c MFr Bld: 5.7 % (ref 4.6–6.5)

## 2019-07-29 LAB — VITAMIN D 25 HYDROXY (VIT D DEFICIENCY, FRACTURES): VITD: 35.39 ng/mL (ref 30.00–100.00)

## 2019-07-29 MED ORDER — FLUTICASONE PROPIONATE 50 MCG/ACT NA SUSP: 2.0000 | Freq: Every day | NASAL | 6 refills | Status: DC

## 2019-07-29 MED ORDER — SUMATRIPTAN 5 MG/ACT NA SOLN: 1.0000 | NASAL | 3 refills | Status: DC | PRN

## 2019-07-29 MED ORDER — LEVOTHYROXINE SODIUM 100 MCG PO TABS: 100.0000 ug | ORAL_TABLET | Freq: Every day | ORAL | 4 refills | Status: DC

## 2019-07-29 MED ORDER — HYDROCHLOROTHIAZIDE 25 MG PO TABS: 25.0000 mg | ORAL_TABLET | Freq: Every day | ORAL | 4 refills | Status: DC

## 2019-07-29 MED ORDER — ZOSTER VAC RECOMB ADJUVANTED 50 MCG/0.5ML IM SUSR: 0.5000 mL | Freq: Once | INTRAMUSCULAR | 1 refills | Status: AC

## 2019-07-29 MED ORDER — ESTROGENS, CONJUGATED 0.625 MG/GM VA CREA: TOPICAL_CREAM | VAGINAL | 11 refills | Status: DC

## 2019-07-29 NOTE — Progress Notes (Addendum)
This visit occurred during the SARS-CoV-2 public health emergency.  Safety protocols were in place, including screening questions prior to the visit, additional usage of staff PPE, and extensive cleaning of exam room while observing appropriate contact time as indicated for disinfecting solutions.    Patient ID: Teresa Patterson, female  DOB: 1941/04/06, 79 y.o.   MRN: [id number] Patient Care Team    Relationship Specialty Notifications Start End  Ma Hillock, DO PCP - General Family Medicine  03/14/18   Danella Sensing, MD Consulting Physician Dermatology  03/14/18   Richmond Campbell, MD Consulting Physician Gastroenterology  03/14/18   Calvert Cantor, MD Consulting Physician Ophthalmology  03/14/18   Marica Otter, [place]  Optometry  03/14/18   Charlotte Crumb, MD Consulting Physician Orthopedic Surgery  03/14/18     Chief Complaint  Patient presents with  . Annual Exam    Fasting. 11/05/2018 Mammogram.     Subjective:  Teresa Patterson is a 79 y.o.  Female  present for CPE. All past medical history, surgical history, allergies, family history, immunizations, medications and social history were updated in the electronic medical record today. All recent labs, ED visits and hospitalizations within the last year were reviewed.  Hypothyroidism, unspecified type Asymptomatic.  Patient reports compliance with levothyroxine 100 mcg daily    Osteoporosis, unspecified osteoporosis type, unspecified pathological fracture presence Due for repeat 2021- taking calcium and vit d  postmenopausal changes:  Patient reports her symptoms are stable with Premarin cream 2 times a week.  migraine without status migrainosus, not intractable Patient reports her migraines are stable, usually suffers from a couple of migraines a year.  Uses Imitrex in those cases-which works well.   Edema:  Patient reports her edema is well controlled she uses HCTZ 25 mg daily  Health maintenance:  Colonoscopy:  > 75 N/A Mammogram: completed:10/2018- [facility] center[facility] ordered today. Cervical cancer screening: > 65 n/a Immunizations: tdap UTD 2018, Influenza 2020 UTD (encouraged yearly), PNA series completed (after 65), shingrix printed today. covid series completed.  Infectious disease screening: N/A DEXA: last completed 01/2015, -1.9, ordered to be completed at the breast center with her mammogram if possible. Assistive device: none Oxygen [id number] Patient has a Dental home. Hospitalizations/ED visits: reviewed   Depression screen [facility] 2/9 07/29/2019 07/25/2018 03/14/2018 07/11/2016 02/08/2015  Decreased Interest 0 0 0 0 0  Down, Depressed, Hopeless 0 0 0 0 1  PHQ - 2 Score 0 0 0 0 1   No flowsheet data found.   Immunization History  Administered Date(s) Administered  . H1N1 06/11/2008  . Influenza Split 03/30/2011  . Influenza Whole 06/04/2007, 02/14/2008, 06/16/2009  . Influenza, High Dose Seasonal PF 02/08/2015, 01/30/2017, 02/15/2018  . Influenza,inj,Quad PF,6+ Mos 02/14/2013, 01/08/2014  . Influenza-Unspecified 02/24/2016, 02/15/2018, 02/20/2019  . PFIZER SARS-COV-2 Vaccination 06/26/2019, 07/22/2019  . Pneumococcal Conjugate-13 10/22/2013  . Pneumococcal Polysaccharide-23 05/17/2006  . Td 03/28/2005, 07/11/2016     Past Medical History:  Diagnosis Date  . Colon polyp   . Hypothyroid    hypothyroid  . Migraine   . Multiple dysplastic nevi    established with Dr. Wilhemina Bonito dermatology   No Known Allergies Past Surgical History:  Procedure Laterality Date  . ABDOMINAL HYSTERECTOMY  1972  . CHOLECYSTECTOMY  1995  . OVARIAN CYST REMOVAL Right    right  . TONSILLECTOMY AND ADENOIDECTOMY  1952   Family History  Problem Relation Age of Onset  . Brain cancer Brother        BRAIN  TUMOR  . Cancer Maternal Uncle        ?   Social History   Social History Narrative   Marital status/children/pets: Widowed. 3 children. Lives alone.    Education/employment: GED, retired.     Safety:      -smoke alarm in the home:Yes     - wears seatbelt: Yes     - Feels safe in their surroundings: Yes    Allergies as of 07/29/2019   No Known Allergies     Medication List       Accurate as of July 29, 2019  4:46 PM. If you have any questions, ask your nurse or doctor.        calcium citrate-vitamin D 315-200 MG-UNIT tablet Commonly known as: CITRACAL+D Take 1 tablet by mouth 2 (two) times daily.   Co Q 10 100 MG Caps Take 1 capsule by mouth daily.   conjugated estrogens vaginal cream Commonly known as: PREMARIN Apply small amounts twice weekly   fluticasone 50 MCG/ACT nasal spray Commonly known as: FLONASE Place 2 sprays into both nostrils daily.   hydrochlorothiazide 25 MG tablet Commonly known as: HYDRODIURIL Take 1 tablet (25 mg total) by mouth daily.   levothyroxine 100 MCG tablet Commonly known as: SYNTHROID Take 1 tablet (100 mcg total) by mouth daily.   multivitamin tablet Take 1 tablet by mouth daily.   SUMAtriptan 5 MG/ACT nasal spray Commonly known as: IMITREX Place 1 spray (5 mg total) into the nose every 2 (two) hours as needed.   vitamin C 100 MG tablet Take 100 mg by mouth daily.   Zoster Vaccine Adjuvanted injection Commonly known as: SHINGRIX Inject 0.5 mLs into the muscle once for 1 dose. Rpt inj once in 2-6 mos. Started by: Howard Pouch, DO       All past medical history, surgical history, allergies, family history, immunizations andmedications were updated in the EMR today and reviewed under the history and medication portions of their EMR.     Recent Results (from the past 2160 hour(s))  CBC     Status: Abnormal   Collection Time: 07/29/19  9:35 AM  Result Value Ref Range   WBC 4.6 4.0 - 10.5 K/uL   RBC 4.34 3.87 - 5.11 Mil/uL   Platelets 193.0 150.0 - 400.0 K/uL   Hemoglobin 11.5 (L) 12.0 - 15.0 g/dL   HCT 34.8 (L) 36.0 - 46.0 %   MCV 80.3 78.0 - 100.0 fl   MCHC 33.0 30.0 - 36.0 g/dL   RDW 15.8 (H) 11.5 - 15.5 %    Comprehensive metabolic panel     Status: None   Collection Time: 07/29/19  9:35 AM  Result Value Ref Range   Sodium 138 135 - 145 mEq/L   Potassium 4.0 3.5 - 5.1 mEq/L   Chloride 103 96 - 112 mEq/L   CO2 29 19 - 32 mEq/L   Glucose, Bld 85 70 - 99 mg/dL   BUN 13 6 - 23 mg/dL   Creatinine, Ser 0.90 0.40 - 1.20 mg/dL   Total Bilirubin 0.6 0.2 - 1.2 mg/dL   Alkaline Phosphatase 55 39 - 117 U/L   AST 14 0 - 37 U/L   ALT 8 0 - 35 U/L   Total Protein 6.7 6.0 - 8.3 g/dL   Albumin 4.0 3.5 - 5.2 g/dL   GFR 60.37 >60.00 mL/min   Calcium 10.0 8.4 - 10.5 mg/dL  Hemoglobin A1c     Status: None   Collection Time:  07/29/19  9:35 AM  Result Value Ref Range   Hgb A1c MFr Bld 5.7 4.6 - 6.5 %    Comment: Glycemic Control Guidelines for People with Diabetes:Non Diabetic:  <6%Goal of Therapy: <7%Additional Action Suggested:  >8%   Lipid panel     Status: None   Collection Time: 07/29/19  9:35 AM  Result Value Ref Range   Cholesterol 168 0 - 200 mg/dL    Comment: ATP III Classification       Desirable:  < 200 mg/dL               Borderline High:  200 - 239 mg/dL          High:  > = 240 mg/dL   Triglycerides 94.0 0.0 - 149.0 mg/dL    Comment: Normal:  <150 mg/dLBorderline High:  150 - 199 mg/dL   HDL 61.30 >39.00 mg/dL   VLDL 18.8 0.0 - 40.0 mg/dL   LDL Cholesterol 88 0 - 99 mg/dL   Total CHOL/HDL Ratio 3     Comment:                Men          Women1/2 Average Risk     3.4          3.3Average Risk          5.0          4.42X Average Risk          9.6          7.13X Average Risk          15.0          11.0                       NonHDL 106.40     Comment: NOTE:  Non-HDL goal should be 30 mg/dL higher than patient's LDL goal (i.e. LDL goal of < 70 mg/dL, would have non-HDL goal of < 100 mg/dL)  TSH     Status: None   Collection Time: 07/29/19  9:35 AM  Result Value Ref Range   TSH 2.62 0.35 - 4.50 uIU/mL  Vitamin D (25 hydroxy)     Status: None   Collection Time: 07/29/19  9:35 AM  Result Value  Ref Range   VITD 35.39 30.00 - 100.00 ng/mL     ROS: 14 pt review of systems performed and negative (unless mentioned in an HPI)  Objective: BP 129/80 (BP Location: Right Arm, Patient Position: Sitting, Cuff Size: Normal)   Pulse 67   Temp 98 F (36.7 C) (Temporal)   Resp 16   Ht 5' 4.1" (1.628 m)   Wt 137 lb 2 oz (62.2 kg)   SpO2 100%   BMI 23.46 kg/m  Gen: Afebrile. No acute distress. Nontoxic in appearance, well-developed, well-nourished, very pleasant Caucasian female. HENT: AT. [place]. Bilateral TM visualized and normal in appearance, normal external auditory canal. MMM, no oral lesions, adequate dentition. Bilateral nares within normal limits. Throat without erythema, ulcerations or exudates.  No cough on exam, mild hoarseness on exam.  Small amount of postnasal drip present on exam. Eyes:Pupils Equal Round Reactive to light, Extraocular movements intact,  Conjunctiva without redness, discharge or icterus. Neck/lymp/endocrine: Supple, no lymphadenopathy, no thyromegaly CV: RRR no murmur, no edema, +2/4 P posterior tibialis pulses.  No carotid bruits. No JVD. Chest: CTAB, no wheeze, rhonchi or crackles.  Normal respiratory effort.  Good air movement.  Abd: Soft.  Flat. NTND. BS present.  No masses palpated. No hepatosplenomegaly. No rebound tenderness or guarding. Skin: No rashes, purpura or petechiae. Warm and well-perfused. Skin intact. Neuro/Msk:  Normal gait. PERLA. EOMi. Alert. Oriented x3.  Cranial nerves II through XII intact. Muscle strength 5/5 upper/lower extremity. DTRs equal bilaterally. Psych: Normal affect, dress and demeanor. Normal speech. Normal thought content and judgment.  No exam data present  Assessment/plan: Teresa Patterson is a 79 y.o. female present for CPE Asymptomatic postmenopausal status Continue Premarin cream 2 times a week  Edema, unspecified type Stable. Continue HCTZ 25 mg - Comprehensive metabolic panel  Hypothyroidism, unspecified type TSH  collected today.  We will refill her levothyroxine after results received an appropriate dose. - Comprehensive metabolic panel - Lipid panel - TSH Osteoporosis, unspecified osteoporosis type, unspecified pathological fracture presence - dexa ordered today. Pt is taking ca/vit d- she is uncertain how much vit d total she is taking, but will check when she gets home.  - Vitamin D (25 hydroxy) Diabetes mellitus screening - Hemoglobin A1c Encounter for long-term current use of medication - Comprehensive metabolic panel Anemia, unspecified type - CBC Migraine without aura and without status migrainosus, not intractable Stable Continue Imitrex as needed Breast cancer screening: 3D mammogram ordered for completion in June. Vaginal odor/Urinary incontinence, unspecified type -Patient feels the odor may be coming from her urine, but admits it does not smell like urine.  Cannot rule out BV as cause.  Collected urinalysis and urine cytology today.  Would consider completing 7-day course of Flagyl even if both are normal below.  She was counseled today on bacterial vaginosis and prevention techniques. - Urine cytology ancillary only(Campbell) - Urinalysis w microscopic + reflex cultur Encounter for general adult medical examination with abnormal findings Patient was encouraged to exercise greater than 150 minutes a week. Patient was encouraged to choose a diet filled with fresh fruits and vegetables, and lean meats. AVS provided to patient today for education/recommendation on gender specific health and safety maintenance.  Return in about 1 year (around 07/28/2020) for CPE (30 min).  Orders Placed This Encounter  Procedures  . MM 3D SCREEN BREAST BILATERAL  . DG Bone Density  . CBC  . Comprehensive metabolic panel  . Hemoglobin A1c  . Lipid panel  . TSH  . Vitamin D (25 hydroxy)  . Urinalysis w microscopic + reflex cultur    Meds ordered this encounter  Medications  . SUMAtriptan  (IMITREX) 5 MG/ACT nasal spray    Sig: Place 1 spray (5 mg total) into the nose every 2 (two) hours as needed.    Dispense:  6 Inhaler    Refill:  3    Hold until requested  . hydrochlorothiazide (HYDRODIURIL) 25 MG tablet    Sig: Take 1 tablet (25 mg total) by mouth daily.    Dispense:  90 tablet    Refill:  4  . fluticasone (FLONASE) 50 MCG/ACT nasal spray    Sig: Place 2 sprays into both nostrils daily.    Dispense:  16 g    Refill:  6  . conjugated estrogens (PREMARIN) vaginal cream    Sig: Apply small amounts twice weekly    Dispense:  42.5 g    Refill:  11    Hold until requestde  . Zoster Vaccine Adjuvanted [facility]) injection    Sig: Inject 0.5 mLs into the muscle once for 1 dose. Rpt inj once in 2-6 mos.    Dispense:  0.5 mL    Refill:  1  . levothyroxine (SYNTHROID) 100 MCG tablet    Sig: Take 1 tablet (100 mcg total) by mouth daily.    Dispense:  90 tablet    Refill:  4   Referral Orders  No referral(s) requested today     Electronically signed by: Howard Pouch, [place]

## 2019-07-29 NOTE — Addendum Note (Signed)
Addended by: Howard Pouch A on: 07/29/2019 04:46 PM   Modules accepted: Orders

## 2019-07-29 NOTE — Patient Instructions (Addendum)
Start allegra daily with the flonase.    Health Maintenance After Age 79 After age 20, you are at a higher risk for certain long-term diseases and infections as well as injuries from falls. Falls are a major cause of broken bones and head injuries in people who are older than age 70. Getting regular preventive care can help to keep you healthy and well. Preventive care includes getting regular testing and making lifestyle changes as recommended by your health care provider. Talk with your health care provider about:  Which screenings and tests you should have. A screening is a test that checks for a disease when you have no symptoms.  A diet and exercise plan that is right for you. What should I know about screenings and tests to prevent falls? Screening and testing are the best ways to find a health problem early. Early diagnosis and treatment give you the best chance of managing medical conditions that are common after age 41. Certain conditions and lifestyle choices may make you more likely to have a fall. Your health care provider may recommend:  Regular vision checks. Poor vision and conditions such as cataracts can make you more likely to have a fall. If you wear glasses, make sure to get your prescription updated if your vision changes.  Medicine review. Work with your health care provider to regularly review all of the medicines you are taking, including over-the-counter medicines. Ask your health care provider about any side effects that may make you more likely to have a fall. Tell your health care provider if any medicines that you take make you feel dizzy or sleepy.  Osteoporosis screening. Osteoporosis is a condition that causes the bones to get weaker. This can make the bones weak and cause them to break more easily.  Blood pressure screening. Blood pressure changes and medicines to control blood pressure can make you feel dizzy.  Strength and balance checks. Your health care  provider may recommend certain tests to check your strength and balance while standing, walking, or changing positions.  Foot health exam. Foot pain and numbness, as well as not wearing proper footwear, can make you more likely to have a fall.  Depression screening. You may be more likely to have a fall if you have a fear of falling, feel emotionally low, or feel unable to do activities that you used to do.  Alcohol use screening. Using too much alcohol can affect your balance and may make you more likely to have a fall. What actions can I take to lower my risk of falls? General instructions  Talk with your health care provider about your risks for falling. Tell your health care provider if: ? You fall. Be sure to tell your health care provider about all falls, even ones that seem minor. ? You feel dizzy, sleepy, or off-balance.  Take over-the-counter and prescription medicines only as told by your health care provider. These include any supplements.  Eat a healthy diet and maintain a healthy weight. A healthy diet includes low-fat dairy products, low-fat (lean) meats, and fiber from whole grains, beans, and lots of fruits and vegetables. Home safety  Remove any tripping hazards, such as rugs, cords, and clutter.  Install safety equipment such as grab bars in bathrooms and safety rails on stairs.  Keep rooms and walkways well-lit. Activity   Follow a regular exercise program to stay fit. This will help you maintain your balance. Ask your health care provider what types of exercise are appropriate  for you.  If you need a cane or walker, use it as recommended by your health care provider.  Wear supportive shoes that have nonskid soles. Lifestyle  Do not drink alcohol if your health care provider tells you not to drink.  If you drink alcohol, limit how much you have: ? 0-1 drink a day for women. ? 0-2 drinks a day for men.  Be aware of how much alcohol is in your drink. In the  [place] one drink equals one typical bottle of beer (12 oz), one-half glass of wine (5 oz), or one shot of hard liquor (1 oz).  Do not use any products that contain nicotine or tobacco, such as cigarettes and e-cigarettes. If you need help quitting, ask your health care provider. Summary  Having a healthy lifestyle and getting preventive care can help to protect your health and wellness after age 37.  Screening and testing are the best way to find a health problem early and help you avoid having a fall. Early diagnosis and treatment give you the best chance for managing medical conditions that are more common for people who are older than age 43.  Falls are a major cause of broken bones and head injuries in people who are older than age 61. Take precautions to prevent a fall at home.  Work with your health care provider to learn what changes you can make to improve your health and wellness and to prevent falls. This information is not intended to replace advice given to you by your health care provider. Make sure you discuss any questions you have with your health care provider. Document Revised: 08/29/2018 Document Reviewed: 03/21/2017 Elsevier Patient Education  2020 [employer].

## 2019-07-30 LAB — URINALYSIS W MICROSCOPIC + REFLEX CULTURE
Bacteria, UA: NONE SEEN /HPF
Bilirubin Urine: NEGATIVE
Glucose, UA: NEGATIVE
Hgb urine dipstick: NEGATIVE
Hyaline Cast: NONE SEEN /LPF
Ketones, ur: NEGATIVE
Leukocyte Esterase: NEGATIVE
Nitrites, Initial: NEGATIVE
Protein, ur: NEGATIVE
RBC / HPF: NONE SEEN /HPF (ref 0–2)
Specific Gravity, Urine: 1.01 (ref 1.001–1.03)
Squamous Epithelial / HPF: NONE SEEN /HPF (ref <=5)
WBC, UA: NONE SEEN /HPF (ref 0–5)
pH: 7 (ref 5.0–8.0)

## 2019-07-30 LAB — NO CULTURE INDICATED

## 2019-07-31 ENCOUNTER — Telehealth: Payer: Self-pay | Admitting: Family Medicine

## 2019-07-31 MED ORDER — METRONIDAZOLE 500 MG PO TABS: 500.0000 mg | ORAL_TABLET | Freq: Two times a day (BID) | ORAL | 0 refills | Status: DC

## 2019-07-31 NOTE — Telephone Encounter (Signed)
Patient advised of results and provider recommendations.  Patient will pick up RX and await cytology results.

## 2019-07-31 NOTE — Telephone Encounter (Signed)
Please inform patient the following information: Urine microanalysis was negative for any bacteria infection in the urine. The cytology which checks for the other form of vaginal infection we discussed called BV, will take sometimes up to a week to return. Since we know she does not have a urinary tract infection I have went ahead and called in medication we talked about called Flagyl which is taken every 12 hours for 7 days that we will treat BV.  She can go ahead and start this medication.  Please advise her to take with food it can cause some mild nausea if not taken with food.  In avoid any alcohol for the week she is on the medication-alcohol use with this medication can cause severe nausea.

## 2019-08-01 LAB — URINE CYTOLOGY ANCILLARY ONLY
Bacterial Vaginitis-Urine: NEGATIVE
Candida Urine: NEGATIVE

## 2019-08-14 DIAGNOSIS — N898 Other specified noninflammatory disorders of vagina: Secondary | ICD-10-CM | POA: Diagnosis not present

## 2019-08-21 DIAGNOSIS — N898 Other specified noninflammatory disorders of vagina: Secondary | ICD-10-CM | POA: Diagnosis not present

## 2019-11-06 ENCOUNTER — Ambulatory Visit
Admission: RE | Admit: 2019-11-06 | Discharge: 2019-11-06 | Disposition: A | Discharge status: Home / Self Care | Payer: Medicare HMO | Source: Ambulatory Visit | Attending: Family Medicine | Admitting: Family Medicine

## 2019-11-06 ENCOUNTER — Telehealth: Payer: Self-pay | Admitting: Family Medicine

## 2019-11-06 ENCOUNTER — Other Ambulatory Visit: Payer: Self-pay

## 2019-11-06 ENCOUNTER — Encounter: Payer: Self-pay | Admitting: Family Medicine

## 2019-11-06 DIAGNOSIS — Z780 Asymptomatic menopausal state: Secondary | ICD-10-CM | POA: Diagnosis not present

## 2019-11-06 DIAGNOSIS — M85852 Other specified disorders of bone density and structure, left thigh: Secondary | ICD-10-CM | POA: Diagnosis not present

## 2019-11-06 DIAGNOSIS — Z1231 Encounter for screening mammogram for malignant neoplasm of breast: Secondary | ICD-10-CM

## 2019-11-06 DIAGNOSIS — M8580 Other specified disorders of bone density and structure, unspecified site: Secondary | ICD-10-CM | POA: Insufficient documentation

## 2019-11-06 DIAGNOSIS — M810 Age-related osteoporosis without current pathological fracture: Secondary | ICD-10-CM

## 2019-11-06 NOTE — Telephone Encounter (Signed)
Please inform patient Bone mineral density is decreased in the osteopenic range at -2.0.  This is only mildly decreased from her last bone density 5 years ago where it was -1.9. -Per calculation patient does have a risk of fracture consistent with the recommendations to start a medication such as Fosamax or like medication.   -If she would like start Fosamax we can call this in for her.  Please let her know I am on vacation and this will be called in on Tuesday. -Maintaining adequate calcium and vitamin D levels as well as daily weightbearing exercise will help maintain bone health.  Vitamin D was in normal range when checked in March.

## 2019-11-07 NOTE — Telephone Encounter (Signed)
Pt was called and given results/information. She was told by Dr todd 4-5 yrs ago to D/C Fosamx. She is unsure as to why he told her this. She is willing to start Fosamax but wanted to make sure it would be safe given she does not know why it was D/C.   Pt asked about mammogram results- advised Dr Raoul Pitch was not here to review them but would be back next week

## 2019-11-11 NOTE — Telephone Encounter (Signed)
I was able to review a few records from that time in 2009. This was pre-EMR so there is not much to go on. It did state they  DCd fosamax in 2010 "until we get more data on postmarketing experience."  So she possibly could have had a side effect, but it does not state this was the case.   I would not want to cause her a side effect from the medication. All the meds for osteopenia have similar side effect profile. The only med that is different is prolia and she currently does not qualify, since she is in the osteopenia range, but not osteoporotic range. Therefore continue maintaining adequate ca and vit d levels and routine exercise.   I added fosamax to her intolerance list as well.

## 2019-11-11 NOTE — Telephone Encounter (Signed)
Pt was called and given information, she verbalized understanding  

## 2019-12-31 DIAGNOSIS — R69 Illness, unspecified: Secondary | ICD-10-CM | POA: Diagnosis not present

## 2020-01-01 ENCOUNTER — Encounter: Payer: Self-pay | Admitting: Family Medicine

## 2020-01-14 DIAGNOSIS — G43909 Migraine, unspecified, not intractable, without status migrainosus: Secondary | ICD-10-CM | POA: Diagnosis not present

## 2020-01-14 DIAGNOSIS — Z823 Family history of stroke: Secondary | ICD-10-CM | POA: Diagnosis not present

## 2020-01-14 DIAGNOSIS — E039 Hypothyroidism, unspecified: Secondary | ICD-10-CM | POA: Diagnosis not present

## 2020-01-14 DIAGNOSIS — J309 Allergic rhinitis, unspecified: Secondary | ICD-10-CM | POA: Diagnosis not present

## 2020-01-14 DIAGNOSIS — Z7982 Long term (current) use of aspirin: Secondary | ICD-10-CM | POA: Diagnosis not present

## 2020-01-14 DIAGNOSIS — K5900 Constipation, unspecified: Secondary | ICD-10-CM | POA: Diagnosis not present

## 2020-01-14 DIAGNOSIS — Z008 Encounter for other general examination: Secondary | ICD-10-CM | POA: Diagnosis not present

## 2020-01-14 DIAGNOSIS — I10 Essential (primary) hypertension: Secondary | ICD-10-CM | POA: Diagnosis not present

## 2020-01-14 DIAGNOSIS — R32 Unspecified urinary incontinence: Secondary | ICD-10-CM | POA: Diagnosis not present

## 2020-02-19 DIAGNOSIS — R69 Illness, unspecified: Secondary | ICD-10-CM | POA: Diagnosis not present

## 2020-03-03 ENCOUNTER — Ambulatory Visit: Payer: Medicare HMO

## 2020-03-03 DIAGNOSIS — R69 Illness, unspecified: Secondary | ICD-10-CM | POA: Diagnosis not present

## 2020-03-04 DIAGNOSIS — L820 Inflamed seborrheic keratosis: Secondary | ICD-10-CM | POA: Diagnosis not present

## 2020-03-04 DIAGNOSIS — L720 Epidermal cyst: Secondary | ICD-10-CM | POA: Diagnosis not present

## 2020-03-04 DIAGNOSIS — L570 Actinic keratosis: Secondary | ICD-10-CM | POA: Diagnosis not present

## 2020-03-04 DIAGNOSIS — L821 Other seborrheic keratosis: Secondary | ICD-10-CM | POA: Diagnosis not present

## 2020-03-16 ENCOUNTER — Telehealth: Payer: Medicare HMO | Admitting: Family Medicine

## 2020-03-16 ENCOUNTER — Encounter: Payer: Self-pay | Admitting: Family Medicine

## 2020-03-16 DIAGNOSIS — J029 Acute pharyngitis, unspecified: Secondary | ICD-10-CM

## 2020-03-16 NOTE — Patient Instructions (Signed)
Sore Throat     

## 2020-03-16 NOTE — Progress Notes (Signed)
   VIRTUAL VISIT VIA VIDEO- fail  SUBJECTIVE Chief Complaint  Patient presents with  . Sore Throat    This is a new problem that onset 2 days ago. The problem has been rapidly worsen last night. There has been no fever. The pain is at a severity of 8/10 Associated sx include congestion, coughing, ear pain, a hoarse voice, shortness of breath, swollen glands and trouble swallowing. She has tried NSAIDs for the symptoms and provided mild relief.     HPI: Teresa Patterson is a 79 y.o. female > unable to log on to complete virtual appt. Pt was instructed to report to ED or UC since she complained of shortness of breath today and can not complete appt. Pt reported understanding.

## 2020-03-17 DIAGNOSIS — J040 Acute laryngitis: Secondary | ICD-10-CM | POA: Diagnosis not present

## 2020-03-23 ENCOUNTER — Telehealth: Payer: Medicare HMO | Admitting: Family Medicine

## 2020-04-22 ENCOUNTER — Telehealth: Payer: Self-pay | Admitting: Family Medicine

## 2020-04-22 NOTE — Telephone Encounter (Signed)
Patient returned call, did not feel she needed to schedule this appt. Instead, scheduled physical appt with Dr. Raoul Pitch in March.

## 2020-04-22 NOTE — Telephone Encounter (Signed)
Left message for patient to schedule Annual Wellness Visit.  Please schedule with Nurse Health Advisor Ataya Stanley, RN at [place] [facility] [place]  °

## 2020-07-15 DIAGNOSIS — N898 Other specified noninflammatory disorders of vagina: Secondary | ICD-10-CM | POA: Diagnosis not present

## 2020-07-27 ENCOUNTER — Other Ambulatory Visit: Payer: Self-pay

## 2020-07-28 ENCOUNTER — Other Ambulatory Visit: Payer: Self-pay

## 2020-07-29 ENCOUNTER — Ambulatory Visit (INDEPENDENT_AMBULATORY_CARE_PROVIDER_SITE_OTHER): Payer: Medicare HMO | Admitting: Family Medicine

## 2020-07-29 ENCOUNTER — Encounter: Payer: Self-pay | Admitting: Family Medicine

## 2020-07-29 VITALS — BP 116/66 | HR 60 | Temp 97.9°F | Ht 64.0 in | Wt 133.0 lb

## 2020-07-29 DIAGNOSIS — Z0000 Encounter for general adult medical examination without abnormal findings: Secondary | ICD-10-CM

## 2020-07-29 DIAGNOSIS — Z780 Asymptomatic menopausal state: Secondary | ICD-10-CM | POA: Diagnosis not present

## 2020-07-29 DIAGNOSIS — M8580 Other specified disorders of bone density and structure, unspecified site: Secondary | ICD-10-CM | POA: Diagnosis not present

## 2020-07-29 DIAGNOSIS — Z131 Encounter for screening for diabetes mellitus: Secondary | ICD-10-CM | POA: Diagnosis not present

## 2020-07-29 DIAGNOSIS — Z1231 Encounter for screening mammogram for malignant neoplasm of breast: Secondary | ICD-10-CM | POA: Diagnosis not present

## 2020-07-29 DIAGNOSIS — R609 Edema, unspecified: Secondary | ICD-10-CM | POA: Diagnosis not present

## 2020-07-29 DIAGNOSIS — Z130 Encounter for screening for diseases of the blood and blood-forming organs and certain disorders involving the immune mechanism: Secondary | ICD-10-CM | POA: Diagnosis not present

## 2020-07-29 DIAGNOSIS — G43009 Migraine without aura, not intractable, without status migrainosus: Secondary | ICD-10-CM

## 2020-07-29 DIAGNOSIS — E039 Hypothyroidism, unspecified: Secondary | ICD-10-CM | POA: Diagnosis not present

## 2020-07-29 LAB — CBC WITH DIFFERENTIAL/PLATELET
Basophils Absolute: 0.1 10*3/uL (ref 0.0–0.1)
Basophils Relative: 1.1 % (ref 0.0–3.0)
Eosinophils Absolute: 0.1 10*3/uL (ref 0.0–0.7)
Eosinophils Relative: 2.6 % (ref 0.0–5.0)
HCT: 39.5 % (ref 36.0–46.0)
Hemoglobin: 13.1 g/dL (ref 12.0–15.0)
Lymphocytes Relative: 34.1 % (ref 12.0–46.0)
Lymphs Abs: 1.9 10*3/uL (ref 0.7–4.0)
MCHC: 33.2 g/dL (ref 30.0–36.0)
MCV: 81.6 fl (ref 78.0–100.0)
Monocytes Absolute: 0.5 10*3/uL (ref 0.1–1.0)
Monocytes Relative: 8.6 % (ref 3.0–12.0)
Neutro Abs: 3 10*3/uL (ref 1.4–7.7)
Neutrophils Relative %: 53.6 % (ref 43.0–77.0)
Platelets: 163 10*3/uL (ref 150.0–400.0)
RBC: 4.85 Mil/uL (ref 3.87–5.11)
RDW: 19.8 % — ABNORMAL HIGH (ref 11.5–15.5)
WBC: 5.5 10*3/uL (ref 4.0–10.5)

## 2020-07-29 LAB — COMPREHENSIVE METABOLIC PANEL
ALT: 8 U/L (ref 0–35)
AST: 15 U/L (ref 0–37)
Albumin: 4.4 g/dL (ref 3.5–5.2)
Alkaline Phosphatase: 52 U/L (ref 39–117)
BUN: 14 mg/dL (ref 6–23)
CO2: 30 mEq/L (ref 19–32)
Calcium: 10.8 mg/dL — ABNORMAL HIGH (ref 8.4–10.5)
Chloride: 103 mEq/L (ref 96–112)
Creatinine, Ser: 0.94 mg/dL (ref 0.40–1.20)
GFR: 57.45 mL/min — ABNORMAL LOW (ref >60.00)
Glucose, Bld: 84 mg/dL (ref 70–99)
Potassium: 4.1 mEq/L (ref 3.5–5.1)
Sodium: 139 mEq/L (ref 135–145)
Total Bilirubin: 0.5 mg/dL (ref 0.2–1.2)
Total Protein: 7.1 g/dL (ref 6.0–8.3)

## 2020-07-29 LAB — TSH: TSH: 2.42 u[IU]/mL (ref 0.35–4.50)

## 2020-07-29 LAB — HEMOGLOBIN A1C: Hgb A1c MFr Bld: 5.8 % (ref 4.6–6.5)

## 2020-07-29 LAB — LIPID PANEL
Cholesterol: 173 mg/dL (ref 0–200)
HDL: 66.4 mg/dL (ref >39.00)
LDL Cholesterol: 88 mg/dL (ref 0–99)
NonHDL: 106.66
Total CHOL/HDL Ratio: 3
Triglycerides: 93 mg/dL (ref 0.0–149.0)
VLDL: 18.6 mg/dL (ref 0.0–40.0)

## 2020-07-29 LAB — VITAMIN D 25 HYDROXY (VIT D DEFICIENCY, FRACTURES): VITD: 54.38 ng/mL (ref 30.00–100.00)

## 2020-07-29 MED ORDER — HYDROCHLOROTHIAZIDE 25 MG PO TABS: 25.0000 mg | ORAL_TABLET | Freq: Every day | ORAL | 4 refills | Status: DC

## 2020-07-29 MED ORDER — SUMATRIPTAN 5 MG/ACT NA SOLN: 1.0000 | NASAL | 3 refills | Status: DC | PRN

## 2020-07-29 MED ORDER — ESTROGENS, CONJUGATED 0.625 MG/GM VA CREA: TOPICAL_CREAM | VAGINAL | 11 refills | Status: DC

## 2020-07-29 NOTE — Patient Instructions (Signed)
Health Maintenance After Age 80 After age 80, you are at a higher risk for certain long-term diseases and infections as well as injuries from falls. Falls are a major cause of broken bones and head injuries in people who are older than age 80. Getting regular preventive care can help to keep you healthy and well. Preventive care includes getting regular testing and making lifestyle changes as recommended by your health care provider. Talk with your health care provider about:  Which screenings and tests you should have. A screening is a test that checks for a disease when you have no symptoms.  A diet and exercise plan that is right for you. What should I know about screenings and tests to prevent falls? Screening and testing are the best ways to find a health problem early. Early diagnosis and treatment give you the best chance of managing medical conditions that are common after age 80. Certain conditions and lifestyle choices may make you more likely to have a fall. Your health care provider may recommend:  Regular vision checks. Poor vision and conditions such as cataracts can make you more likely to have a fall. If you wear glasses, make sure to get your prescription updated if your vision changes.  Medicine review. Work with your health care provider to regularly review all of the medicines you are taking, including over-the-counter medicines. Ask your health care provider about any side effects that may make you more likely to have a fall. Tell your health care provider if any medicines that you take make you feel dizzy or sleepy.  Osteoporosis screening. Osteoporosis is a condition that causes the bones to get weaker. This can make the bones weak and cause them to break more easily.  Blood pressure screening. Blood pressure changes and medicines to control blood pressure can make you feel dizzy.  Strength and balance checks. Your health care provider may recommend certain tests to check your  strength and balance while standing, walking, or changing positions.  Foot health exam. Foot pain and numbness, as well as not wearing proper footwear, can make you more likely to have a fall.  Depression screening. You may be more likely to have a fall if you have a fear of falling, feel emotionally low, or feel unable to do activities that you used to do.  Alcohol use screening. Using too much alcohol can affect your balance and may make you more likely to have a fall. What actions can I take to lower my risk of falls? General instructions  Talk with your health care provider about your risks for falling. Tell your health care provider if: ? You fall. Be sure to tell your health care provider about all falls, even ones that seem minor. ? You feel dizzy, sleepy, or off-balance.  Take over-the-counter and prescription medicines only as told by your health care provider. These include any supplements.  Eat a healthy diet and maintain a healthy weight. A healthy diet includes low-fat dairy products, low-fat (lean) meats, and fiber from whole grains, beans, and lots of fruits and vegetables. Home safety  Remove any tripping hazards, such as rugs, cords, and clutter.  Install safety equipment such as grab bars in bathrooms and safety rails on stairs.  Keep rooms and walkways well-lit. Activity  Follow a regular exercise program to stay fit. This will help you maintain your balance. Ask your health care provider what types of exercise are appropriate for you.  If you need a cane or walker,   use it as recommended by your health care provider.  Wear supportive shoes that have nonskid soles.   Lifestyle  Do not drink alcohol if your health care provider tells you not to drink.  If you drink alcohol, limit how much you have: ? 0-1 drink a day for women. ? 0-2 drinks a day for men.  Be aware of how much alcohol is in your drink. In the [place] one drink equals one typical bottle of beer (12  oz), one-half glass of wine (5 oz), or one shot of hard liquor (1 oz).  Do not use any products that contain nicotine or tobacco, such as cigarettes and e-cigarettes. If you need help quitting, ask your health care provider. Summary  Having a healthy lifestyle and getting preventive care can help to protect your health and wellness after age 80.  Screening and testing are the best way to find a health problem early and help you avoid having a fall. Early diagnosis and treatment give you the best chance for managing medical conditions that are more common for people who are older than age 80.  Falls are a major cause of broken bones and head injuries in people who are older than age 80. Take precautions to prevent a fall at home.  Work with your health care provider to learn what changes you can make to improve your health and wellness and to prevent falls. This information is not intended to replace advice given to you by your health care provider. Make sure you discuss any questions you have with your health care provider. Document Revised: 08/29/2018 Document Reviewed: 03/21/2017 Elsevier Patient Education  2021 Elsevier Inc.  

## 2020-07-29 NOTE — Progress Notes (Signed)
This visit occurred during the SARS-CoV-2 public health emergency.  Safety protocols were in place, including screening questions prior to the visit, additional usage of staff PPE, and extensive cleaning of exam room while observing appropriate contact time as indicated for disinfecting solutions.    Patient ID: Teresa Patterson, female  DOB: January 19, 1941, 80 y.o.   MRN: [id number] Patient Care Team    Relationship Specialty Notifications Start End  Ma Hillock, DO PCP - General Family Medicine  03/14/18   Danella Sensing, MD Consulting Physician Dermatology  03/14/18   Richmond Campbell, MD Consulting Physician Gastroenterology  03/14/18   Calvert Cantor, MD Consulting Physician Ophthalmology  03/14/18   Marica Otter, [place]  Optometry  03/14/18   Charlotte Crumb, MD Consulting Physician Orthopedic Surgery  03/14/18     Chief Complaint  Patient presents with  . Annual Exam    Pt is fasting;     Subjective:  Teresa Patterson is a 80 y.o.  Female  present for CPE/CMC. All past medical history, surgical history, allergies, family history, immunizations, medications and social history were updated in the electronic medical record today. All recent labs, ED visits and hospitalizations within the last year were reviewed.  Hypothyroidism, unspecified type Patient reports compliance with levothyroxine 100 mcg daily   Osteoporosis, unspecified osteoporosis type, unspecified pathological fracture presence Has been stable. Last Dexa 2021- osteopenia -2.0  postmenopausal changes:  Patient reports her symptoms are stable with Premarin cream 2 times a week.  migraine without status migrainosus, not intractable Patient reports her migraines are stable, usually suffers from a couple of migraines a year.  Uses Imitrex in those cases-which works well.  Edema: Patient reports her edema is well controlled she uses HCTZ 25 mg daily  Health maintenance: Colonoscopy:> 75 N/A Mammogram:  completed:10/2018- [facility] [place] ordered today. Cervical cancer screening:> 65 n/a Immunizations: tdapUTD 2018, Influenza2021 UTD(encouraged yearly), PNA series completed (after 65),shingrix completed. covid series completed.  Infectious disease screening:N/A DEXA: 2021 UTD Assistive device: none Oxygen AST:MHDQ Patient has a Dental home. Hospitalizations/ED visits: reviewed  Depression screen [facility] 2/9 07/29/2020 07/29/2019 07/25/2018 03/14/2018 07/11/2016  Decreased Interest 0 0 0 0 0  Down, Depressed, Hopeless 0 0 0 0 0  PHQ - 2 Score 0 0 0 0 0   No flowsheet data found.  Immunization History  Administered Date(s) Administered  . H1N1 06/11/2008  . Influenza Split 03/30/2011  . Influenza Whole 06/04/2007, 02/14/2008, 06/16/2009  . Influenza, High Dose Seasonal PF 02/08/2015, 01/30/2017, 02/15/2018  . Influenza,inj,Quad PF,6+ Mos 02/14/2013, 01/08/2014  . Influenza-Unspecified 02/24/2016, 02/15/2018, 02/20/2019, 03/03/2020  . PFIZER(Purple Top)SARS-COV-2 Vaccination 06/26/2019, 07/22/2019, 02/19/2020  . Pneumococcal Conjugate-13 10/22/2013  . Pneumococcal Polysaccharide-23 05/17/2006  . Td 03/28/2005, 07/11/2016  . Zoster Recombinat (Shingrix) 10/31/2019, 12/31/2019    Past Medical History:  Diagnosis Date  . Colon polyp   . Hypothyroid    hypothyroid  . Migraine   . Multiple dysplastic nevi    established with Dr. Wilhemina Bonito dermatology  . Osteopenia    Allergies  Allergen Reactions  . Fosamax [Alendronate]    Past Surgical History:  Procedure Laterality Date  . ABDOMINAL HYSTERECTOMY  1972  . CHOLECYSTECTOMY  1995  . OVARIAN CYST REMOVAL Right    right  . TONSILLECTOMY AND ADENOIDECTOMY  1952   Family History  Problem Relation Age of Onset  . Brain cancer Brother        BRAIN TUMOR  . Cancer Maternal Uncle        ?  Social History   Social History Narrative   Marital status/children/pets: Widowed. 3 children. Lives alone.     Education/employment: GED, retired.    Safety:      -smoke alarm in the home:Yes     - wears seatbelt: Yes     - Feels safe in their surroundings: Yes    Allergies as of 07/29/2020      Reactions   Fosamax [alendronate]       Medication List       Accurate as of July 29, 2020  9:42 AM. If you have any questions, ask your nurse or doctor.        calcium citrate-vitamin D 315-200 MG-UNIT tablet Commonly known as: CITRACAL+D Take 1 tablet by mouth 2 (two) times daily.   Co Q 10 100 MG Caps Take 1 capsule by mouth daily.   conjugated estrogens vaginal cream Commonly known as: PREMARIN Apply small amounts twice weekly   fluticasone 50 MCG/ACT nasal spray Commonly known as: FLONASE Place 2 sprays into both nostrils daily.   hydrochlorothiazide 25 MG tablet Commonly known as: HYDRODIURIL Take 1 tablet (25 mg total) by mouth daily.   levothyroxine 100 MCG tablet Commonly known as: SYNTHROID Take 1 tablet (100 mcg total) by mouth daily.   multivitamin tablet Take 1 tablet by mouth daily.   SUMAtriptan 5 MG/ACT nasal spray Commonly known as: IMITREX Place 1 spray (5 mg total) into the nose every 2 (two) hours as needed.   vitamin C 100 MG tablet Take 100 mg by mouth daily.       All past medical history, surgical history, allergies, family history, immunizations andmedications were updated in the EMR today and reviewed under the history and medication portions of their EMR.     No results found for this or any previous visit (from the past 2160 hour(s)).   ROS: 14 pt review of systems performed and negative (unless mentioned in an HPI)  Objective: BP 116/66   Pulse 60   Temp 97.9 F (36.6 C) (Oral)   Ht 5\' 4"  (1.626 m)   Wt 133 lb (60.3 kg)   SpO2 99%   BMI 22.83 kg/m  Gen: Afebrile. No acute distress. Nontoxic in appearance, well-developed, well-nourished, pleasant female.  HENT: AT. [place]. Bilateral TM visualized and normal in appearance, normal external  auditory canal. MMM, no oral lesions, adequate dentition. Bilateral nares within normal limits. Throat without erythema, ulcerations or exudates. no Cough on exam, no hoarseness on exam. Eyes:Pupils Equal Round Reactive to light, Extraocular movements intact,  Conjunctiva without redness, discharge or icterus. Neck/lymp/endocrine: Supple,no lymphadenopathy, no thyromegaly CV: RRR no murmur, no edema, +2/4 P posterior tibialis pulses.  Chest: CTAB, no wheeze, rhonchi or crackles. normal Respiratory effort. good Air movement. Abd: Soft. flat. NTND. BS present. no Masses palpated. No hepatosplenomegaly. No rebound tenderness or guarding. Skin: no rashes, purpura or petechiae. Warm and well-perfused. Skin intact. Neuro/Msk:  Normal gait. PERLA. EOMi. Alert. Oriented x3.  Cranial nerves II through XII intact. Muscle strength 5/5 upper/lower extremity. DTRs equal bilaterally. Psych: Normal affect, dress and demeanor. Normal speech. Normal thought content and judgment.   No exam data present  Assessment/plan: MELEAH DEMEYER is a 80 y.o. female present for CPE/CMC postmenopausal status Stable. Continue  Premarin cream 2 times a week> printed script per pt request  Edema, unspecified type stable continue HCTZ 25 mg - Comprehensive metabolic panel  Hypothyroidism, unspecified type TSH collected today.  We will refill her levothyroxine after results  received an appropriate dose. - Comprehensive metabolic panel - Lipid panel - TSH Osteopenia, unspecified location - dexa utd  - VITAMIN D 25 Hydroxy (Vit-D Deficiency, Fractures) Diabetes mellitus screening - Comprehensive metabolic panel - Hemoglobin A1c Screening for deficiency anemia - CBC with Differential/Platelet Breast cancer screening by mammogram - MM 3D SCREEN BREAST BILATERAL; Future Routine general medical examination at a health care facility Patient was encouraged to exercise greater than 150 minutes a week. Patient was  encouraged to choose a diet filled with fresh fruits and vegetables, and lean meats. AVS provided to patient today for education/recommendation on gender specific health and safety maintenance. Colonoscopy:> 75 N/A Mammogram: completed:10/2018- breast center[place] ordered today. Cervical cancer screening:> 65 n/a Immunizations: tdapUTD 2018, Influenza2021 UTD(encouraged yearly), PNA series completed (after 65),shingrix completed. covid series completed.  Infectious disease screening:N/A DEXA: 2021 UTD- osteopenia -2.0  Return in about 1 year (around 07/29/2021) for CPE (30 min).  Orders Placed This Encounter  Procedures  . CBC with Differential/Platelet  . Comprehensive metabolic panel  . Hemoglobin A1c  . Lipid panel  . TSH  . VITAMIN D 25 Hydroxy (Vit-D Deficiency, Fractures)   Meds ordered this encounter  Medications  . SUMAtriptan (IMITREX) 5 MG/ACT nasal spray    Sig: Place 1 spray (5 mg total) into the nose every 2 (two) hours as needed.    Dispense:  6 each    Refill:  3  . hydrochlorothiazide (HYDRODIURIL) 25 MG tablet    Sig: Take 1 tablet (25 mg total) by mouth daily.    Dispense:  90 tablet    Refill:  4    Hold until pt request  . conjugated estrogens (PREMARIN) vaginal cream    Sig: Apply small amounts twice weekly    Dispense:  42.5 g    Refill:  11   Referral Orders  No referral(s) requested today     Electronically signed by: Howard Pouch, [place]

## 2020-08-02 ENCOUNTER — Other Ambulatory Visit: Payer: Self-pay | Admitting: Family Medicine

## 2020-08-02 MED ORDER — LEVOTHYROXINE SODIUM 100 MCG PO TABS
100.0000 ug | ORAL_TABLET | Freq: Every day | ORAL | 4 refills | Status: DC
Start: 2020-08-02 — End: 2021-09-05

## 2020-08-17 DIAGNOSIS — Z961 Presence of intraocular lens: Secondary | ICD-10-CM | POA: Diagnosis not present

## 2020-09-21 DIAGNOSIS — H0102A Squamous blepharitis right eye, upper and lower eyelids: Secondary | ICD-10-CM | POA: Diagnosis not present

## 2020-09-21 DIAGNOSIS — H0102B Squamous blepharitis left eye, upper and lower eyelids: Secondary | ICD-10-CM | POA: Diagnosis not present

## 2020-11-08 ENCOUNTER — Ambulatory Visit: Payer: Medicare HMO | Admitting: Family Medicine

## 2020-11-10 ENCOUNTER — Other Ambulatory Visit: Payer: Self-pay

## 2020-11-10 ENCOUNTER — Ambulatory Visit (INDEPENDENT_AMBULATORY_CARE_PROVIDER_SITE_OTHER): Payer: Medicare HMO | Admitting: Family Medicine

## 2020-11-10 ENCOUNTER — Encounter: Payer: Self-pay | Admitting: Family Medicine

## 2020-11-10 DIAGNOSIS — N1831 Chronic kidney disease, stage 3a: Secondary | ICD-10-CM | POA: Diagnosis not present

## 2020-11-10 NOTE — Patient Instructions (Signed)
Hypercalcemia Hypercalcemia is when the level of calcium in a person's blood is above normal. The body needs calcium to make bones and keep them strong. Calcium also helpsthe muscles, nerves, brain, and heart work the way they should. Most of the calcium in the body is in the bones. There is also some calcium in the blood. Hypercalcemia can happen when calcium comes out of the bones, or when the kidneys are not able to remove calcium from the blood. Hypercalcemiacan be mild or severe. What are the causes? There are many possible causes of hypercalcemia. Common causes of this condition include: Hyperparathyroidism. This is a condition in which the body produces too much parathyroid hormone. There are four parathyroid glands in your neck. These glands produce a chemical messenger (hormone) that helps the body absorb calcium from foods and helps your bones release calcium. Certain kinds of cancer. Less common causes of hypercalcemia include: Getting too much calcium or vitamin D from your diet. Kidney failure. Hyperthyroidism. Severe dehydration. Being on bed rest or being inactive for a long time. Certain medicines. Infections. What increases the risk? You are more likely to develop this condition if you: Are female. Are 60 years of age or older. Have a family history of hypercalcemia. What are the signs or symptoms? Mild hypercalcemia that starts slowly may not cause symptoms. Severe, sudden hypercalcemia is more likely to cause symptoms, such as: Being more thirsty than usual. Needing to urinate more often than usual. Abdominal pain. Nausea and vomiting. Constipation. Muscle pain, twitching, or weakness. Feeling very tired. How is this diagnosed?  Hypercalcemia is usually diagnosed with a blood test. You may also have tests to help determine what is causing this condition, such as imaging tests andmore blood tests. How is this treated? Treatment for hypercalcemia depends on the  cause. Treatment may include: Receiving fluids through an IV. Medicines that: Keep calcium levels steady after receiving fluids (loop diuretics). Keep calcium in your bones (bisphosphonates). Lower the calcium level in your blood. Surgery to remove overactive parathyroid glands. A procedure that filters your blood to correct calcium levels (hemodialysis). Follow these instructions at home:  Take over-the-counter and prescription medicines only as told by your health care provider. Follow instructions from your health care provider about eating or drinking restrictions. Drink enough fluid to keep your urine pale yellow. Stay active. Weight-bearing exercise helps to keep calcium in your bones. Follow instructions from your health care provider about what type and level of exercise is safe for you. Keep all follow-up visits as told by your health care provider. This is important. Contact a health care provider if you have: A fever. A heartbeat that is irregular or very fast. Changes in mood, memory, or personality. Get help right away if you: Have severe abdominal pain. Have chest pain. Have trouble breathing. Become very confused and sleepy. Lose consciousness. Summary Hypercalcemia is when the level of calcium in a person's blood is above normal. The body needs calcium to make bones and keep them strong. Calcium also helps the muscles, nerves, brain, and heart work the way they should. There are many possible causes of hypercalcemia, and treatment depends on the cause. Take over-the-counter and prescription medicines only as told by your health care provider. Follow instructions from your health care provider about eating or drinking restrictions. This information is not intended to replace advice given to you by your health care provider. Make sure you discuss any questions you have with your healthcare provider. Document Revised: 06/04/2018 Document   Reviewed: 02/11/2018 Elsevier  Patient Education  2022 Elsevier Inc.  

## 2020-11-10 NOTE — Progress Notes (Signed)
This visit occurred during the SARS-CoV-2 public health emergency.  Safety protocols were in place, including screening questions prior to the visit, additional usage of staff PPE, and extensive cleaning of exam room while observing appropriate contact time as indicated for disinfecting solutions.    Teresa Patterson , 07-14-1940, 80 y.o., female MRN: [id number] Patient Care Team    Relationship Specialty Notifications Start End  Ma Hillock, DO PCP - General Family Medicine  03/14/18   Danella Sensing, MD Consulting Physician Dermatology  03/14/18   Richmond Campbell, MD Consulting Physician Gastroenterology  03/14/18   Calvert Cantor, MD Consulting Physician Ophthalmology  03/14/18   Marica Otter, [place]  Optometry  03/14/18   Charlotte Crumb, MD Consulting Physician Orthopedic Surgery  03/14/18     Chief Complaint  Patient presents with   kidney function     Subjective: Pt presents for an OV with complaints to follow-up on abnormal labs 3 months ago of elevated calcium and decreasing GFR, both which were new for her.  She does endorse eating a great deal of dairy and she had also been taking a calcium supplement.  She reports since her last appointment she had spoken with her daughter who also said she was told she had an elevated calcium on blood collection.  Patient denies any symptoms such as stomach upset, nausea, constipation or memory decline.  Depression screen [facility] 2/9 07/29/2020 07/29/2019 07/25/2018 03/14/2018 07/11/2016  Decreased Interest 0 0 0 0 0  Down, Depressed, Hopeless 0 0 0 0 0  PHQ - 2 Score 0 0 0 0 0    Allergies  Allergen Reactions   Fosamax [Alendronate]    Social History   Social History Narrative   Marital status/children/pets: Widowed. 3 children. Lives alone.    Education/employment: GED, retired.    Safety:      -smoke alarm in the home:Yes     - wears seatbelt: Yes     - Feels safe in their surroundings: Yes   Past Medical History:  Diagnosis Date    Colon polyp    Hypothyroid    hypothyroid   Migraine    Multiple dysplastic nevi    established with Dr. Wilhemina Bonito dermatology   Osteopenia    Past Surgical History:  Procedure Laterality Date   ABDOMINAL HYSTERECTOMY  [place] Right    right   TONSILLECTOMY AND ADENOIDECTOMY  1952   Family History  Problem Relation Age of Onset   Brain cancer Brother        BRAIN TUMOR   Cancer Maternal Uncle        ?   Allergies as of 11/10/2020       Reactions   Fosamax [alendronate]         Medication List        Accurate as of November 10, 2020  5:49 PM. If you have any questions, ask your nurse or doctor.          STOP taking these medications    neomycin-polymyxin b-dexamethasone 3.5-10000-0.1 Oint Commonly known as: MAXITROL Stopped by: Howard Pouch, DO   vitamin C 100 MG tablet Stopped by: Howard Pouch, DO       TAKE these medications    calcium citrate-vitamin D 315-200 MG-UNIT tablet Commonly known as: CITRACAL+D Take 1 tablet by mouth 2 (two) times daily.   Co Q 10 100 MG Caps Take 1 capsule by mouth daily.  conjugated estrogens vaginal cream Commonly known as: PREMARIN Apply small amounts twice weekly   fluticasone 50 MCG/ACT nasal spray Commonly known as: FLONASE Place 2 sprays into both nostrils daily.   hydrochlorothiazide 25 MG tablet Commonly known as: HYDRODIURIL Take 1 tablet (25 mg total) by mouth daily.   levothyroxine 100 MCG tablet Commonly known as: SYNTHROID Take 1 tablet (100 mcg total) by mouth daily.   multivitamin tablet Take 1 tablet by mouth daily.   SUMAtriptan 5 MG/ACT nasal spray Commonly known as: IMITREX Place 1 spray (5 mg total) into the nose every 2 (two) hours as needed.        All past medical history, surgical history, allergies, family history, immunizations andmedications were updated in the EMR today and reviewed under the history and medication portions of  their EMR.     ROS: Negative, with the exception of above mentioned in HPI   Objective:  BP 119/63   Pulse (!) 58   Temp 98.1 F (36.7 C) (Oral)   Ht $R'5\' 4"'Je$  (1.626 m)   Wt 131 lb (59.4 kg)   SpO2 96%   BMI 22.49 kg/m  Body mass index is 22.49 kg/m. Gen: Afebrile. No acute distress. Nontoxic in appearance, well developed, well nourished.  HENT: AT. [place].  Eyes:Pupils Equal Round Reactive to light, Extraocular movements intact,  Conjunctiva without redness, discharge or icterus. Neck/lymp/endocrine: Supple, no lymphadenopathy, no thyromegaly or nodules palpated. Neuro: Alert. Oriented x3  Psych: Normal affect, dress and demeanor. Normal speech. Normal thought content and judgment.  No results found. No results found. No results found for this or any previous visit (from the past 24 hour(s)).  Assessment/Plan: Teresa Patterson is a 80 y.o. female present for OV for  Hypercalcemia - Comp Met (CMET) -She consumes a great deal of dairy in her diet, she possibly was just over supplementing with the calcium tablet as well.  Also could be secondary to HCTZ or mild dehydration.  We discussed all of these possible causes today.  Encouraged her to hydrate.  Stage 3a chronic kidney disease (HCC) Very mild decrease in GFR to 57.5 again may have been secondary to dehydration Since she was fasting.  She reports she did hydrate today.  We will recollect her kidney function labs today.  Reviewed expectations re: course of current medical issues. Discussed self-management of symptoms. Outlined signs and symptoms indicating need for more acute intervention. Patient verbalized understanding and all questions were answered. Patient received an After-Visit Summary.    Orders Placed This Encounter  Procedures   Comp Met (CMET)   No orders of the defined types were placed in this encounter.  Referral Orders  No referral(s) requested today     Note is dictated utilizing voice recognition  software. Although note has been proof read prior to signing, occasional typographical errors still can be missed. If any questions arise, please do not hesitate to call for verification.   electronically signed by:  Howard Pouch, DO  [place]

## 2020-11-10 NOTE — Addendum Note (Signed)
Addended by: Howard Pouch A on: 11/10/2020 05:50 PM   Modules accepted: Level of Service

## 2020-11-11 DIAGNOSIS — Z961 Presence of intraocular lens: Secondary | ICD-10-CM | POA: Diagnosis not present

## 2020-11-11 DIAGNOSIS — H26493 Other secondary cataract, bilateral: Secondary | ICD-10-CM | POA: Diagnosis not present

## 2020-11-11 LAB — COMPREHENSIVE METABOLIC PANEL
AG Ratio: 1.7 (calc) (ref 1.0–2.5)
ALT: 7 U/L (ref 6–29)
AST: 16 U/L (ref 10–35)
Albumin: 4 g/dL (ref 3.6–5.1)
Alkaline phosphatase (APISO): 50 U/L (ref 37–153)
BUN: 13 mg/dL (ref 7–25)
CO2: 26 mmol/L (ref 20–32)
Calcium: 9.9 mg/dL (ref 8.6–10.4)
Chloride: 105 mmol/L (ref 98–110)
Creat: 0.85 mg/dL (ref 0.60–0.88)
Globulin: 2.4 g/dL (calc) (ref 1.9–3.7)
Glucose, Bld: 96 mg/dL (ref 65–99)
Potassium: 3.7 mmol/L (ref 3.5–5.3)
Sodium: 140 mmol/L (ref 135–146)
Total Bilirubin: 0.3 mg/dL (ref 0.2–1.2)
Total Protein: 6.4 g/dL (ref 6.1–8.1)

## 2020-11-16 ENCOUNTER — Ambulatory Visit
Admission: RE | Admit: 2020-11-16 | Discharge: 2020-11-16 | Disposition: A | Discharge status: Home / Self Care | Payer: Medicare HMO | Source: Ambulatory Visit | Attending: Family Medicine | Admitting: Family Medicine

## 2020-11-16 ENCOUNTER — Other Ambulatory Visit: Payer: Self-pay

## 2020-11-16 DIAGNOSIS — Z1231 Encounter for screening mammogram for malignant neoplasm of breast: Secondary | ICD-10-CM

## 2020-11-25 DIAGNOSIS — H26492 Other secondary cataract, left eye: Secondary | ICD-10-CM | POA: Diagnosis not present

## 2020-11-25 DIAGNOSIS — Z961 Presence of intraocular lens: Secondary | ICD-10-CM | POA: Diagnosis not present

## 2020-12-16 DIAGNOSIS — R197 Diarrhea, unspecified: Secondary | ICD-10-CM | POA: Diagnosis not present

## 2020-12-16 DIAGNOSIS — K921 Melena: Secondary | ICD-10-CM | POA: Diagnosis not present

## 2020-12-16 DIAGNOSIS — Z86010 Personal history of colonic polyps: Secondary | ICD-10-CM | POA: Diagnosis not present

## 2020-12-16 DIAGNOSIS — R1319 Other dysphagia: Secondary | ICD-10-CM | POA: Diagnosis not present

## 2020-12-28 DIAGNOSIS — M5136 Other intervertebral disc degeneration, lumbar region: Secondary | ICD-10-CM | POA: Diagnosis not present

## 2020-12-28 DIAGNOSIS — M9903 Segmental and somatic dysfunction of lumbar region: Secondary | ICD-10-CM | POA: Diagnosis not present

## 2020-12-28 DIAGNOSIS — M9904 Segmental and somatic dysfunction of sacral region: Secondary | ICD-10-CM | POA: Diagnosis not present

## 2020-12-28 DIAGNOSIS — M9905 Segmental and somatic dysfunction of pelvic region: Secondary | ICD-10-CM | POA: Diagnosis not present

## 2020-12-30 DIAGNOSIS — G43909 Migraine, unspecified, not intractable, without status migrainosus: Secondary | ICD-10-CM | POA: Diagnosis not present

## 2020-12-30 DIAGNOSIS — I10 Essential (primary) hypertension: Secondary | ICD-10-CM | POA: Diagnosis not present

## 2020-12-30 DIAGNOSIS — E039 Hypothyroidism, unspecified: Secondary | ICD-10-CM | POA: Diagnosis not present

## 2020-12-30 DIAGNOSIS — K219 Gastro-esophageal reflux disease without esophagitis: Secondary | ICD-10-CM | POA: Diagnosis not present

## 2020-12-30 DIAGNOSIS — Z823 Family history of stroke: Secondary | ICD-10-CM | POA: Diagnosis not present

## 2020-12-30 DIAGNOSIS — Z7982 Long term (current) use of aspirin: Secondary | ICD-10-CM | POA: Diagnosis not present

## 2020-12-30 DIAGNOSIS — Z8249 Family history of ischemic heart disease and other diseases of the circulatory system: Secondary | ICD-10-CM | POA: Diagnosis not present

## 2020-12-30 DIAGNOSIS — Z809 Family history of malignant neoplasm, unspecified: Secondary | ICD-10-CM | POA: Diagnosis not present

## 2020-12-30 DIAGNOSIS — R32 Unspecified urinary incontinence: Secondary | ICD-10-CM | POA: Diagnosis not present

## 2021-01-03 DIAGNOSIS — M9903 Segmental and somatic dysfunction of lumbar region: Secondary | ICD-10-CM | POA: Diagnosis not present

## 2021-01-03 DIAGNOSIS — M9905 Segmental and somatic dysfunction of pelvic region: Secondary | ICD-10-CM | POA: Diagnosis not present

## 2021-01-03 DIAGNOSIS — M5136 Other intervertebral disc degeneration, lumbar region: Secondary | ICD-10-CM | POA: Diagnosis not present

## 2021-01-03 DIAGNOSIS — M9904 Segmental and somatic dysfunction of sacral region: Secondary | ICD-10-CM | POA: Diagnosis not present

## 2021-01-05 DIAGNOSIS — M9903 Segmental and somatic dysfunction of lumbar region: Secondary | ICD-10-CM | POA: Diagnosis not present

## 2021-01-05 DIAGNOSIS — M5136 Other intervertebral disc degeneration, lumbar region: Secondary | ICD-10-CM | POA: Diagnosis not present

## 2021-01-05 DIAGNOSIS — M9905 Segmental and somatic dysfunction of pelvic region: Secondary | ICD-10-CM | POA: Diagnosis not present

## 2021-01-05 DIAGNOSIS — M9904 Segmental and somatic dysfunction of sacral region: Secondary | ICD-10-CM | POA: Diagnosis not present

## 2021-01-10 DIAGNOSIS — M5136 Other intervertebral disc degeneration, lumbar region: Secondary | ICD-10-CM | POA: Diagnosis not present

## 2021-01-10 DIAGNOSIS — M9904 Segmental and somatic dysfunction of sacral region: Secondary | ICD-10-CM | POA: Diagnosis not present

## 2021-01-10 DIAGNOSIS — M9903 Segmental and somatic dysfunction of lumbar region: Secondary | ICD-10-CM | POA: Diagnosis not present

## 2021-01-10 DIAGNOSIS — M9905 Segmental and somatic dysfunction of pelvic region: Secondary | ICD-10-CM | POA: Diagnosis not present

## 2021-02-11 DIAGNOSIS — R1319 Other dysphagia: Secondary | ICD-10-CM | POA: Diagnosis not present

## 2021-02-11 DIAGNOSIS — K648 Other hemorrhoids: Secondary | ICD-10-CM | POA: Diagnosis not present

## 2021-02-11 DIAGNOSIS — K5730 Diverticulosis of large intestine without perforation or abscess without bleeding: Secondary | ICD-10-CM | POA: Diagnosis not present

## 2021-02-11 DIAGNOSIS — K921 Melena: Secondary | ICD-10-CM | POA: Diagnosis not present

## 2021-02-11 DIAGNOSIS — R194 Change in bowel habit: Secondary | ICD-10-CM | POA: Diagnosis not present

## 2021-02-11 DIAGNOSIS — K222 Esophageal obstruction: Secondary | ICD-10-CM | POA: Diagnosis not present

## 2021-02-11 DIAGNOSIS — Z1211 Encounter for screening for malignant neoplasm of colon: Secondary | ICD-10-CM | POA: Diagnosis not present

## 2021-02-11 LAB — HM COLONOSCOPY

## 2021-03-09 DIAGNOSIS — L723 Sebaceous cyst: Secondary | ICD-10-CM | POA: Diagnosis not present

## 2021-03-09 DIAGNOSIS — L821 Other seborrheic keratosis: Secondary | ICD-10-CM | POA: Diagnosis not present

## 2021-03-09 DIAGNOSIS — B078 Other viral warts: Secondary | ICD-10-CM | POA: Diagnosis not present

## 2021-03-09 DIAGNOSIS — L570 Actinic keratosis: Secondary | ICD-10-CM | POA: Diagnosis not present

## 2021-08-02 ENCOUNTER — Encounter: Payer: Medicare HMO | Admitting: Family Medicine

## 2021-08-11 ENCOUNTER — Encounter: Payer: Medicare HMO | Admitting: Family Medicine

## 2021-08-15 ENCOUNTER — Telehealth: Payer: Self-pay

## 2021-08-15 NOTE — Telephone Encounter (Signed)
Spoke with pt to schedule AWV in office. Patient declined to schedule wellness visit at this time.   

## 2021-09-05 ENCOUNTER — Ambulatory Visit (INDEPENDENT_AMBULATORY_CARE_PROVIDER_SITE_OTHER): Payer: Medicare HMO | Admitting: Family Medicine

## 2021-09-05 ENCOUNTER — Encounter: Payer: Self-pay | Admitting: Family Medicine

## 2021-09-05 ENCOUNTER — Other Ambulatory Visit: Payer: Self-pay | Admitting: Family Medicine

## 2021-09-05 VITALS — BP 120/67 | HR 62 | Temp 97.6°F | Ht 64.5 in | Wt 135.0 lb

## 2021-09-05 DIAGNOSIS — Z131 Encounter for screening for diabetes mellitus: Secondary | ICD-10-CM

## 2021-09-05 DIAGNOSIS — M8580 Other specified disorders of bone density and structure, unspecified site: Secondary | ICD-10-CM | POA: Diagnosis not present

## 2021-09-05 DIAGNOSIS — J302 Other seasonal allergic rhinitis: Secondary | ICD-10-CM | POA: Diagnosis not present

## 2021-09-05 DIAGNOSIS — Z1231 Encounter for screening mammogram for malignant neoplasm of breast: Secondary | ICD-10-CM

## 2021-09-05 DIAGNOSIS — G43701 Chronic migraine without aura, not intractable, with status migrainosus: Secondary | ICD-10-CM | POA: Diagnosis not present

## 2021-09-05 DIAGNOSIS — G43709 Chronic migraine without aura, not intractable, without status migrainosus: Secondary | ICD-10-CM | POA: Insufficient documentation

## 2021-09-05 DIAGNOSIS — E034 Atrophy of thyroid (acquired): Secondary | ICD-10-CM | POA: Diagnosis not present

## 2021-09-05 DIAGNOSIS — N951 Menopausal and female climacteric states: Secondary | ICD-10-CM | POA: Diagnosis not present

## 2021-09-05 DIAGNOSIS — G43009 Migraine without aura, not intractable, without status migrainosus: Secondary | ICD-10-CM

## 2021-09-05 DIAGNOSIS — R609 Edema, unspecified: Secondary | ICD-10-CM

## 2021-09-05 DIAGNOSIS — Z0000 Encounter for general adult medical examination without abnormal findings: Secondary | ICD-10-CM

## 2021-09-05 LAB — COMPREHENSIVE METABOLIC PANEL
ALT: 7 U/L (ref 0–35)
AST: 13 U/L (ref 0–37)
Albumin: 4 g/dL (ref 3.5–5.2)
Alkaline Phosphatase: 56 U/L (ref 39–117)
BUN: 16 mg/dL (ref 6–23)
CO2: 29 mEq/L (ref 19–32)
Calcium: 9.7 mg/dL (ref 8.4–10.5)
Chloride: 105 mEq/L (ref 96–112)
Creatinine, Ser: 0.86 mg/dL (ref 0.40–1.20)
GFR: 63.42 mL/min (ref >60.00)
Glucose, Bld: 87 mg/dL (ref 70–99)
Potassium: 3.8 mEq/L (ref 3.5–5.1)
Sodium: 140 mEq/L (ref 135–145)
Total Bilirubin: 0.4 mg/dL (ref 0.2–1.2)
Total Protein: 6.2 g/dL (ref 6.0–8.3)

## 2021-09-05 LAB — CBC WITH DIFFERENTIAL/PLATELET
Basophils Absolute: 0.1 10*3/uL (ref 0.0–0.1)
Basophils Relative: 1.6 % (ref 0.0–3.0)
Eosinophils Absolute: 0.2 10*3/uL (ref 0.0–0.7)
Eosinophils Relative: 4 % (ref 0.0–5.0)
HCT: 34.8 % — ABNORMAL LOW (ref 36.0–46.0)
Hemoglobin: 11.8 g/dL — ABNORMAL LOW (ref 12.0–15.0)
Lymphocytes Relative: 34.4 % (ref 12.0–46.0)
Lymphs Abs: 1.7 10*3/uL (ref 0.7–4.0)
MCHC: 33.9 g/dL (ref 30.0–36.0)
MCV: 87.2 fl (ref 78.0–100.0)
Monocytes Absolute: 0.4 10*3/uL (ref 0.1–1.0)
Monocytes Relative: 8.7 % (ref 3.0–12.0)
Neutro Abs: 2.6 10*3/uL (ref 1.4–7.7)
Neutrophils Relative %: 51.3 % (ref 43.0–77.0)
Platelets: 172 10*3/uL (ref 150.0–400.0)
RBC: 3.99 Mil/uL (ref 3.87–5.11)
RDW: 15.4 % (ref 11.5–15.5)
WBC: 5.1 10*3/uL (ref 4.0–10.5)

## 2021-09-05 LAB — VITAMIN D 25 HYDROXY (VIT D DEFICIENCY, FRACTURES): VITD: 39.51 ng/mL (ref 30.00–100.00)

## 2021-09-05 LAB — LIPID PANEL
Cholesterol: 161 mg/dL (ref 0–200)
HDL: 59.8 mg/dL (ref >39.00)
LDL Cholesterol: 85 mg/dL (ref 0–99)
NonHDL: 101.41
Total CHOL/HDL Ratio: 3
Triglycerides: 83 mg/dL (ref 0.0–149.0)
VLDL: 16.6 mg/dL (ref 0.0–40.0)

## 2021-09-05 LAB — TSH: TSH: 2.22 u[IU]/mL (ref 0.35–5.50)

## 2021-09-05 LAB — HEMOGLOBIN A1C: Hgb A1c MFr Bld: 5.6 % (ref 4.6–6.5)

## 2021-09-05 MED ORDER — HYDROCHLOROTHIAZIDE 25 MG PO TABS: 25.0000 mg | ORAL_TABLET | Freq: Every day | ORAL | 4 refills | Status: DC

## 2021-09-05 MED ORDER — FLUTICASONE PROPIONATE 50 MCG/ACT NA SUSP: 2.0000 | Freq: Every day | NASAL | 6 refills | Status: DC

## 2021-09-05 MED ORDER — LEVOTHYROXINE SODIUM 100 MCG PO TABS: 100.0000 ug | ORAL_TABLET | Freq: Every day | ORAL | 4 refills | Status: DC

## 2021-09-05 MED ORDER — SUMATRIPTAN 5 MG/ACT NA SOLN: 1.0000 | NASAL | 3 refills | Status: DC | PRN

## 2021-09-05 MED ORDER — ESTROGENS CONJUGATED 0.625 MG/GM VA CREA: TOPICAL_CREAM | VAGINAL | 12 refills | Status: DC

## 2021-09-05 NOTE — Patient Instructions (Signed)
?Great to see you today.  ?I have refilled the medication(s) we provide.  ? ?If labs were collected, we will inform you of lab results once received either by echart message or telephone call.  ? - echart message- for normal results that have been seen by the patient already.  ? - telephone call: abnormal results or if patient has not viewed results in their echart. ? ? ?No follow-ups on file. ? ? ?Health Maintenance After Age 81 ?After age 25, you are at a higher risk for certain long-term diseases and infections as well as injuries from falls. Falls are a major cause of broken bones and head injuries in people who are older than age 28. Getting regular preventive care can help to keep you healthy and well. Preventive care includes getting regular testing and making lifestyle changes as recommended by your health care provider. Talk with your health care provider about: ?Which screenings and tests you should have. A screening is a test that checks for a disease when you have no symptoms. ?A diet and exercise plan that is right for you. ?What should I know about screenings and tests to prevent falls? ?Screening and testing are the best ways to find a health problem early. Early diagnosis and treatment give you the best chance of managing medical conditions that are common after age 5. Certain conditions and lifestyle choices may make you more likely to have a fall. Your health care provider may recommend: ?Regular vision checks. Poor vision and conditions such as cataracts can make you more likely to have a fall. If you wear glasses, make sure to get your prescription updated if your vision changes. ?Medicine review. Work with your health care provider to regularly review all of the medicines you are taking, including over-the-counter medicines. Ask your health care provider about any side effects that may make you more likely to have a fall. Tell your health care provider if any medicines that you take make you  feel dizzy or sleepy. ?Strength and balance checks. Your health care provider may recommend certain tests to check your strength and balance while standing, walking, or changing positions. ?Foot health exam. Foot pain and numbness, as well as not wearing proper footwear, can make you more likely to have a fall. ?Screenings, including: ?Osteoporosis screening. Osteoporosis is a condition that causes the bones to get weaker and break more easily. ?Blood pressure screening. Blood pressure changes and medicines to control blood pressure can make you feel dizzy. ?Depression screening. You may be more likely to have a fall if you have a fear of falling, feel depressed, or feel unable to do activities that you used to do. ?Alcohol use screening. Using too much alcohol can affect your balance and may make you more likely to have a fall. ?Follow these instructions at home: ?Lifestyle ?Do not drink alcohol if: ?Your health care provider tells you not to drink. ?If you drink alcohol: ?Limit how much you have to: ?0-1 drink a day for women. ?0-2 drinks a day for men. ?Know how much alcohol is in your drink. In the [place] one drink equals one 12 oz bottle of beer (355 mL), one 5 oz glass of wine (148 mL), or one 1? oz glass of hard liquor (44 mL). ?Do not use any products that contain nicotine or tobacco. These products include cigarettes, chewing tobacco, and vaping devices, such as e-cigarettes. If you need help quitting, ask your health care provider. ?Activity ? ?Follow a regular exercise program  to stay fit. This will help you maintain your balance. Ask your health care provider what types of exercise are appropriate for you. ?If you need a cane or walker, use it as recommended by your health care provider. ?Wear supportive shoes that have nonskid soles. ?Safety ? ?Remove any tripping hazards, such as rugs, cords, and clutter. ?Install safety equipment such as grab bars in bathrooms and safety rails on stairs. ?Keep rooms  and walkways well-lit. ?General instructions ?Talk with your health care provider about your risks for falling. Tell your health care provider if: ?You fall. Be sure to tell your health care provider about all falls, even ones that seem minor. ?You feel dizzy, tiredness (fatigue), or off-balance. ?Take over-the-counter and prescription medicines only as told by your health care provider. These include supplements. ?Eat a healthy diet and maintain a healthy weight. A healthy diet includes low-fat dairy products, low-fat (lean) meats, and fiber from whole grains, beans, and lots of fruits and vegetables. ?Stay current with your vaccines. ?Schedule regular health, dental, and eye exams. ?Summary ?Having a healthy lifestyle and getting preventive care can help to protect your health and wellness after age 69. ?Screening and testing are the best way to find a health problem early and help you avoid having a fall. Early diagnosis and treatment give you the best chance for managing medical conditions that are more common for people who are older than age 13. ?Falls are a major cause of broken bones and head injuries in people who are older than age 35. Take precautions to prevent a fall at home. ?Work with your health care provider to learn what changes you can make to improve your health and wellness and to prevent falls. ?This information is not intended to replace advice given to you by your health care provider. Make sure you discuss any questions you have with your health care provider. ?Document Revised: 09/27/2020 Document Reviewed: 09/27/2020 ?Elsevier Patient Education ? [place]. ? ?

## 2021-09-05 NOTE — Progress Notes (Signed)
? ?This visit occurred during the SARS-CoV-2 public health emergency.  Safety protocols were in place, including screening questions prior to the visit, additional usage of staff PPE, and extensive cleaning of exam room while observing appropriate contact time as indicated for disinfecting solutions.  ? ? ?Patient ID: Teresa Patterson, female  DOB: [birth year], 81 y.o.   MRN: [id number] ?Patient Care Team  ?  Relationship Specialty Notifications Start End  ?Ma Hillock, DO PCP - General Family Medicine  03/14/18   ?Danella Sensing, MD Consulting Physician Dermatology  03/14/18   ?Richmond Campbell, MD Consulting Physician Gastroenterology  03/14/18   ?Calvert Cantor, MD Consulting Physician Ophthalmology  03/14/18   ?Marica Otter, [place]  Optometry  03/14/18   ?Charlotte Crumb, MD Consulting Physician Orthopedic Surgery  03/14/18   ? ? ?Chief Complaint  ?Patient presents with  ? Annual Exam  ?  Pt is fasting  ? ? ?Subjective: ? ?Teresa Patterson is a 81 y.o.  Female  present for CPE/CMC ?All past medical history, surgical history, allergies, family history, immunizations, medications and social history were updated in the electronic medical record today. ?All recent labs, ED visits and hospitalizations within the last year were reviewed. ? ?Health maintenance:  ?Colonoscopy: > 75 N/A ?Mammogram: completed:10/2018- [facility] ordered today ?Cervical cancer screening: > 65 n/a ?Immunizations: tdap UTD 2018, Influenza 2022 UTD (encouraged yearly), PNA series completed (after 65), shingrix completed. covid series completed.  ?Infectious disease screening: N/A ?DEXA: 2021 > ordered today (osteopenia) ?Assistive device: none ?Oxygen HAL:PFXT ?Patient has a Dental home. ?Hospitalizations/ED visits: reviewed ? ?Hypothyroidism, unspecified type ?Patient reports compliance  with levothyroxine 100 mcg daily   ?  ?Osteoporosis, unspecified osteoporosis type, unspecified pathological fracture presence ?Has been stable. Last  Dexa 2021- osteopenia -2.0 ?  ?postmenopausal changes:  ?Patient reports her symptoms are stable with Premarin cream 2 times a week. ?  ?migraine without status migrainosus, not intractable ?Patient reports her migraines are stable, usually suffers from a couple of migraines a year.  Uses Imitrex in those cases-which works well.  ?  ?Edema:  ?Patient reports her edema is well controlled she uses HCTZ 25 mg daily  ? ? ? ?  09/05/2021  ?  8:30 AM 07/29/2020  ?  9:03 AM 07/29/2019  ?  9:02 AM 07/25/2018  ?  9:13 AM 03/14/2018  ? 10:28 AM  ?Depression screen PHQ 2/9  ?Decreased Interest 0 0 0 0 0  ?Down, Depressed, Hopeless 0 0 0 0 0  ?PHQ - 2 Score 0 0 0 0 0  ? ?   ? View : No data to display.  ?  ?  ?  ? ? ?Immunization History  ?Administered Date(s) Administered  ? H1N1 06/11/2008  ? Influenza Split 03/30/2011  ? Influenza Whole 06/04/2007, 02/14/2008, 06/16/2009  ? Influenza, High Dose Seasonal PF 02/08/2015, 01/30/2017, 02/15/2018, 02/24/2021  ? Influenza,inj,Quad PF,6+ Mos 02/14/2013, 01/08/2014  ? Influenza-Unspecified 02/24/2016, 02/15/2018, 02/20/2019, 03/03/2020  ? PFIZER(Purple Top)SARS-COV-2 Vaccination 06/26/2019, 07/22/2019, 02/19/2020, 02/24/2021  ? Pneumococcal Conjugate-13 10/22/2013  ? Pneumococcal Polysaccharide-23 05/17/2006  ? Td 03/28/2005, 07/11/2016  ? Zoster Recombinat (Shingrix) 10/31/2019, 12/31/2019  ? ? ? ?Past Medical History:  ?Diagnosis Date  ? Colon polyp   ? Hypothyroid   ? hypothyroid  ? Migraine   ? Multiple dysplastic nevi   ? established with Dr. Wilhemina Bonito dermatology  ? Osteopenia   ? ?Allergies  ?Allergen Reactions  ? Fosamax [Alendronate]   ? ?Past Surgical History:  ?Procedure  Laterality Date  ? ABDOMINAL HYSTERECTOMY  1972  ? CHOLECYSTECTOMY  1995  ? OVARIAN CYST REMOVAL Right   ? right  ? [place]  ? ?Family History  ?Problem Relation Age of Onset  ? Brain cancer Brother   ?     BRAIN TUMOR  ? Cancer Maternal Uncle   ?     ?  ? ?Social History  ? ?Social  History Narrative  ? Marital status/children/pets: Widowed. 3 children. Lives alone.   ? Education/employment: GED, retired.   ? Safety:   ?   -smoke alarm in the home:Yes  ?   - wears seatbelt: Yes  ?   - Feels safe in their surroundings: Yes  ? ? ?Allergies as of 09/05/2021   ? ?   Reactions  ? Fosamax [alendronate]   ? ?  ? ?  ?Medication List  ?  ? ?  ? Accurate as of September 05, 2021  8:55 AM. If you have any questions, ask your nurse or doctor.  ?  ?  ? ?  ? ?STOP taking these medications   ? ?calcium citrate-vitamin D 315-200 MG-UNIT tablet ?Commonly known as: CITRACAL+D ?Stopped by: Howard Pouch, DO ?  ? ?  ? ?TAKE these medications   ? ?Co Q 10 100 MG Caps ?Take 1 capsule by mouth daily. ?  ?conjugated estrogens vaginal cream ?Commonly known as: PREMARIN ?1 applicator of cream  intravaginally 1-2 weekly qhs ?What changed: additional instructions ?Changed by: Howard Pouch, DO ?  ?fluticasone 50 MCG/ACT nasal spray ?Commonly known as: FLONASE ?Place 2 sprays into both nostrils daily. ?  ?hydrochlorothiazide 25 MG tablet ?Commonly known as: HYDRODIURIL ?Take 1 tablet (25 mg total) by mouth daily. ?  ?levothyroxine 100 MCG tablet ?Commonly known as: SYNTHROID ?Take 1 tablet (100 mcg total) by mouth daily. ?  ?multivitamin tablet ?Take 1 tablet by mouth daily. ?  ?SUMAtriptan 5 MG/ACT nasal spray ?Commonly known as: IMITREX ?Place 1 spray (5 mg total) into the nose every 2 (two) hours as needed. ?  ? ?  ? ? ?All past medical history, surgical history, allergies, family history, immunizations andmedications were updated in the EMR today and reviewed under the history and medication portions of their EMR.    ? ?No results found for this or any previous visit (from the past 2160 hour(s)). ? ?MM 3D SCREEN BREAST BILATERAL ?Result Date: 11/17/2020 ?CLINICAL DATA:  Screening. EXAM: DIGITAL SCREENING BILATERAL MAMMOGRAM WITH TOMOSYNTHESIS AND CAD TECHNIQUE: Bilateral screening digital craniocaudal and mediolateral oblique  mammograms were obtained. Bilateral screening digital breast tomosynthesis was performed. The images were evaluated with computer-aided detection. COMPARISON:  Previous exam(s). ACR Breast Density Category b: There are scattered areas of fibroglandular density. FINDINGS: There are no findings suspicious for malignancy. IMPRESSION: No mammographic evidence of malignancy. A result letter of this screening mammogram will be mailed directly to the patient. RECOMMENDATION: Screening mammogram in one year. (Code:SM-B-01Y) BI-RADS CATEGORY  1: Negative. Electronically Signed   By: Lajean Manes M.D.   On: 11/17/2020 16:08  ? ? ? ?ROS ?14 pt review of systems performed and negative (unless mentioned in an HPI) ? ?Objective: ?BP 120/67   Pulse 62   Temp 97.6 ?F (36.4 ?C) (Oral)   Ht 5' 4.5" (1.638 m)   Wt 135 lb (61.2 kg)   SpO2 99%   BMI 22.81 kg/m?  ?Physical Exam ?Vitals and nursing note reviewed.  ?Constitutional:   ?   General: She is  not in acute distress. ?   Appearance: Normal appearance. She is not ill-appearing or toxic-appearing.  ?HENT:  ?   Head: Normocephalic and atraumatic.  ?   Right Ear: Tympanic membrane, ear canal and external ear normal. There is no impacted cerumen.  ?   Left Ear: Tympanic membrane, ear canal and external ear normal. There is no impacted cerumen.  ?   Nose: No congestion or rhinorrhea.  ?   Mouth/Throat:  ?   Mouth: Mucous membranes are moist.  ?   Pharynx: Oropharynx is clear. No oropharyngeal exudate or posterior oropharyngeal erythema.  ?Eyes:  ?   General: No scleral icterus.    ?   Right eye: No discharge.     ?   Left eye: No discharge.  ?   Extraocular Movements: Extraocular movements intact.  ?   Conjunctiva/sclera: Conjunctivae normal.  ?   Pupils: Pupils are equal, round, and reactive to light.  ?Cardiovascular:  ?   Rate and Rhythm: Normal rate and regular rhythm.  ?   Pulses: Normal pulses.  ?   Heart sounds: Normal heart sounds. No murmur heard. ?  No friction rub. No  gallop.  ?Pulmonary:  ?   Effort: Pulmonary effort is normal. No respiratory distress.  ?   Breath sounds: Normal breath sounds. No stridor. No wheezing, rhonchi or rales.  ?Chest:  ?   Chest wall: No tendern

## 2021-09-26 DIAGNOSIS — H02055 Trichiasis without entropian left lower eyelid: Secondary | ICD-10-CM | POA: Diagnosis not present

## 2021-09-26 DIAGNOSIS — T1502XA Foreign body in cornea, left eye, initial encounter: Secondary | ICD-10-CM | POA: Diagnosis not present

## 2021-09-26 DIAGNOSIS — Z961 Presence of intraocular lens: Secondary | ICD-10-CM | POA: Diagnosis not present

## 2021-12-14 DIAGNOSIS — H04123 Dry eye syndrome of bilateral lacrimal glands: Secondary | ICD-10-CM | POA: Diagnosis not present

## 2021-12-14 DIAGNOSIS — H17821 Peripheral opacity of cornea, right eye: Secondary | ICD-10-CM | POA: Diagnosis not present

## 2021-12-14 DIAGNOSIS — Z961 Presence of intraocular lens: Secondary | ICD-10-CM | POA: Diagnosis not present

## 2021-12-19 DIAGNOSIS — G43909 Migraine, unspecified, not intractable, without status migrainosus: Secondary | ICD-10-CM | POA: Diagnosis not present

## 2021-12-19 DIAGNOSIS — R32 Unspecified urinary incontinence: Secondary | ICD-10-CM | POA: Diagnosis not present

## 2021-12-19 DIAGNOSIS — Z8249 Family history of ischemic heart disease and other diseases of the circulatory system: Secondary | ICD-10-CM | POA: Diagnosis not present

## 2021-12-19 DIAGNOSIS — Z809 Family history of malignant neoplasm, unspecified: Secondary | ICD-10-CM | POA: Diagnosis not present

## 2021-12-19 DIAGNOSIS — I10 Essential (primary) hypertension: Secondary | ICD-10-CM | POA: Diagnosis not present

## 2021-12-19 DIAGNOSIS — Z7982 Long term (current) use of aspirin: Secondary | ICD-10-CM | POA: Diagnosis not present

## 2021-12-19 DIAGNOSIS — E034 Atrophy of thyroid (acquired): Secondary | ICD-10-CM | POA: Diagnosis not present

## 2022-01-01 IMAGING — MG MM DIGITAL SCREENING BILAT W/ TOMO AND CAD
8 series · 9 of 24 positions shown · non-contrast
Comparison: Previous exam(s).

CLINICAL DATA: Screening.

EXAM:
DIGITAL SCREENING BILATERAL MAMMOGRAM WITH TOMOSYNTHESIS AND CAD
TECHNIQUE: Bilateral screening digital craniocaudal and mediolateral oblique
mammograms were obtained. Bilateral screening digital breast
tomosynthesis was performed. The images were evaluated with
computer-aided detection.

[L CC synth-2D]
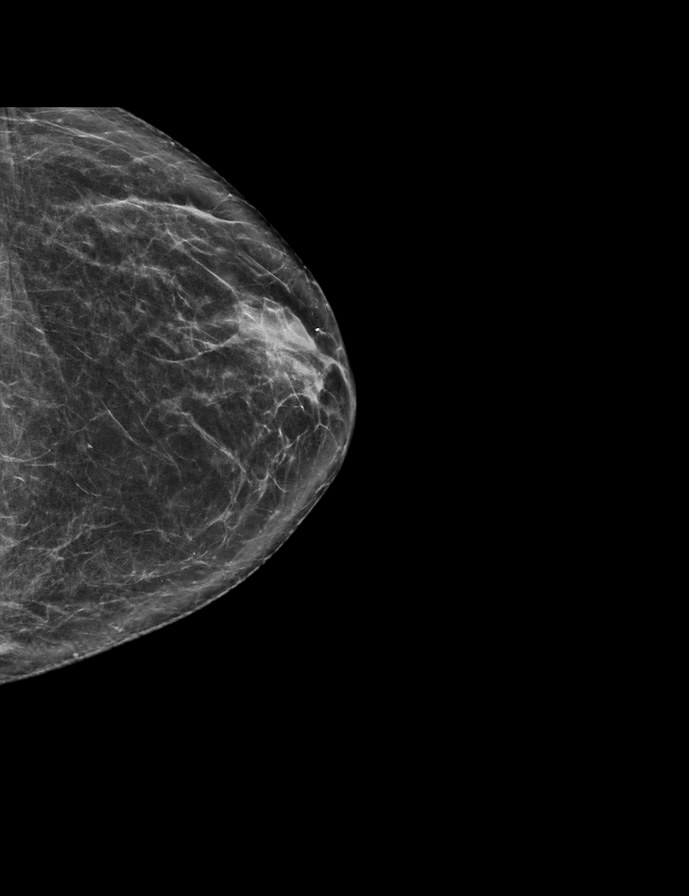

[R MLO synth-2D]
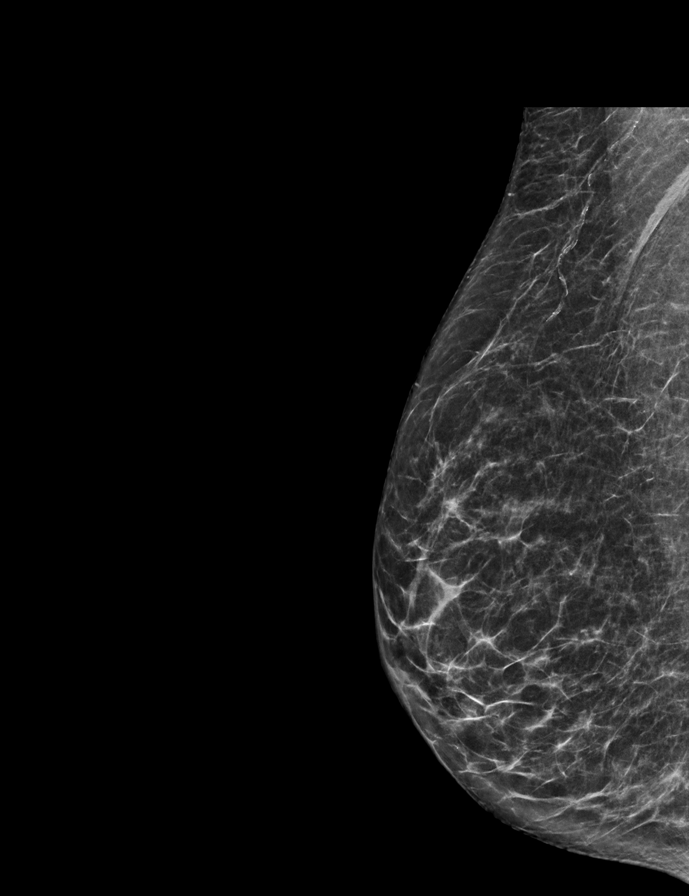

[L MLO synth-2D]
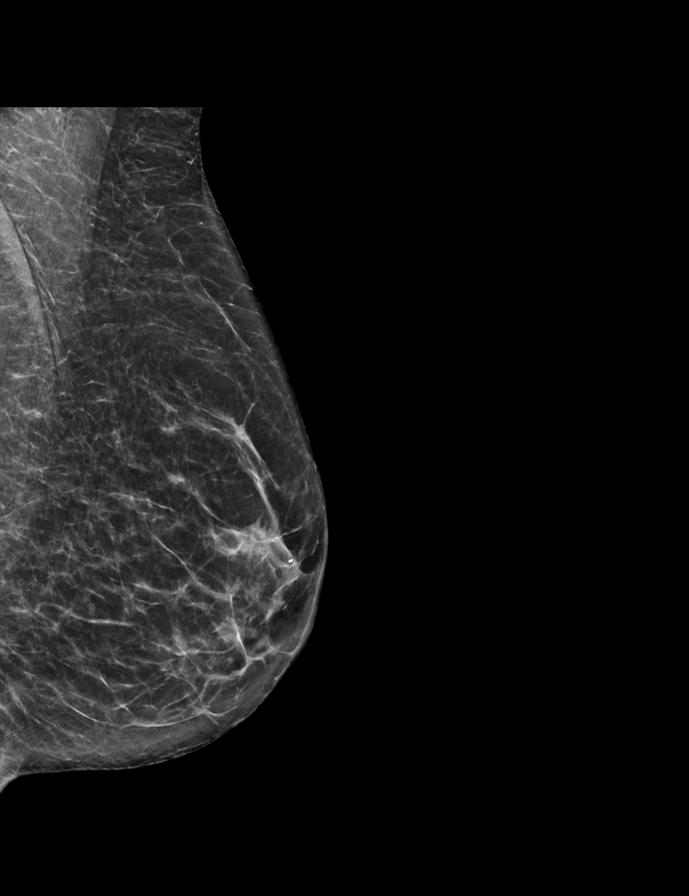

[R CC synth-2D]
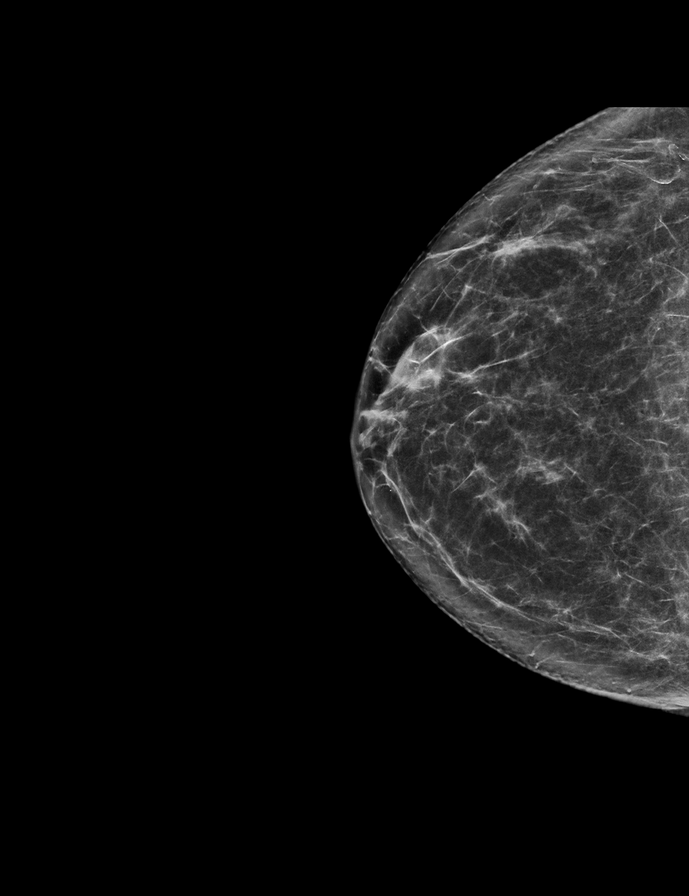

[R CC tomo · 2 of 64 frames shown]
[frame 21/64]
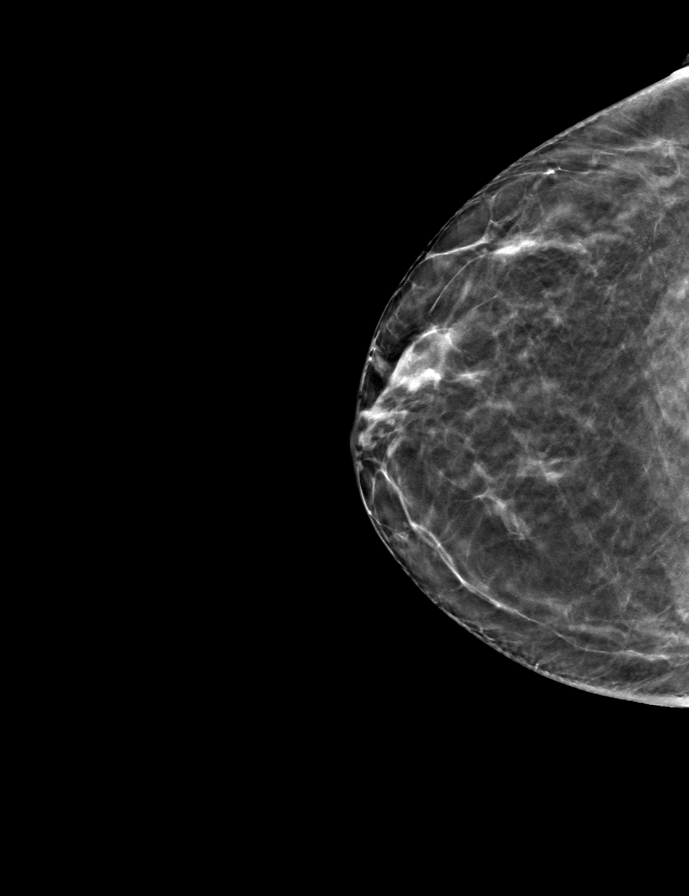
[frame 33/64]
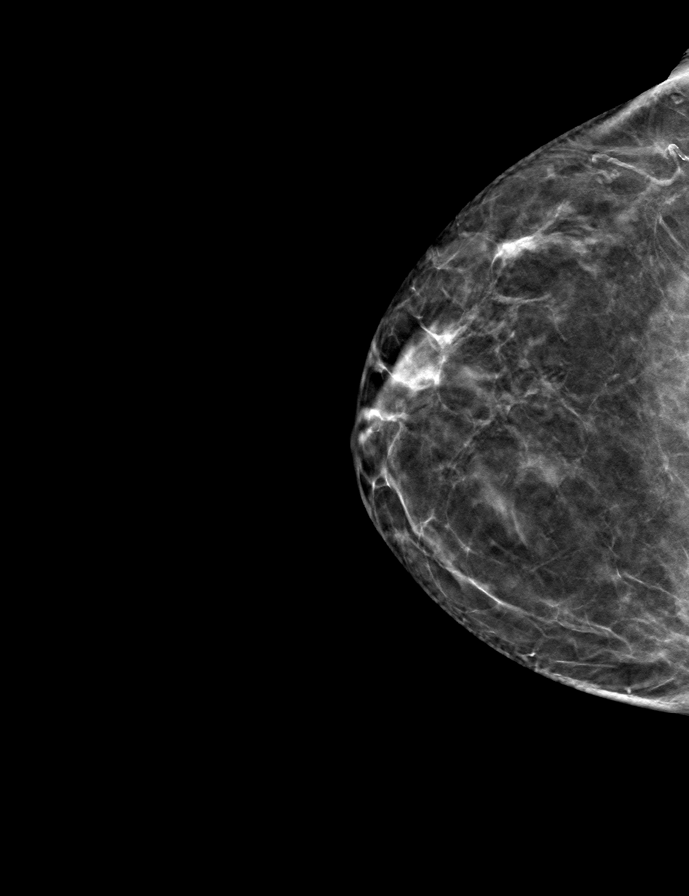

[L CC tomo · tomo slice 31/62.0]
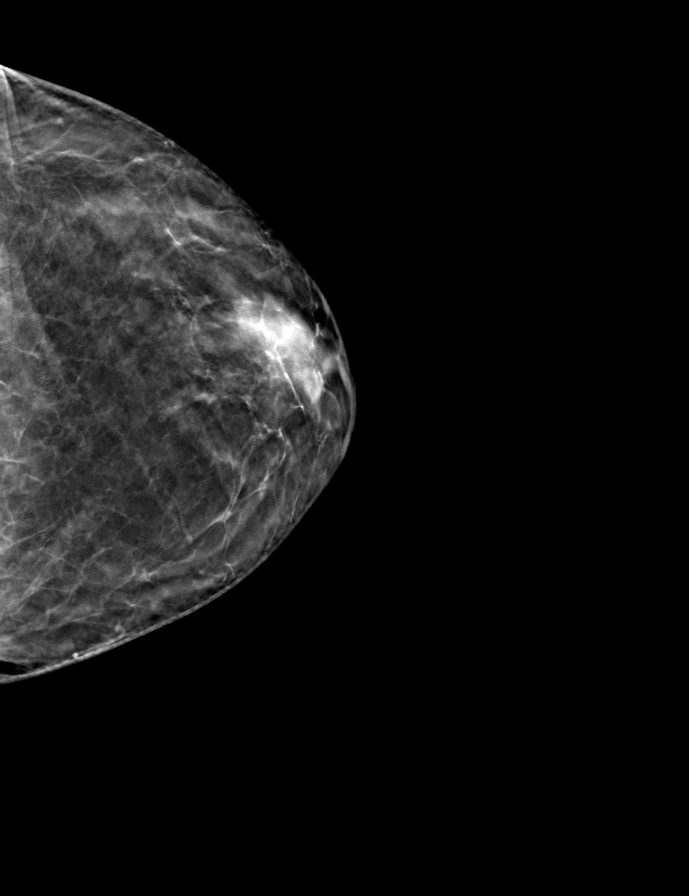

[R MLO tomo · tomo slice 31/60.0]
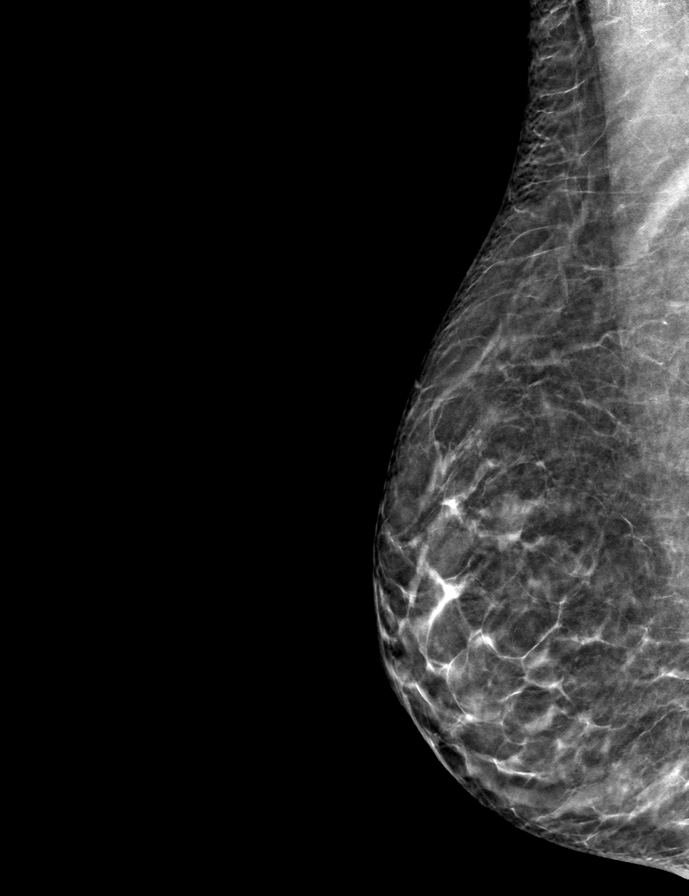

[L MLO tomo · tomo slice 32/63.0]
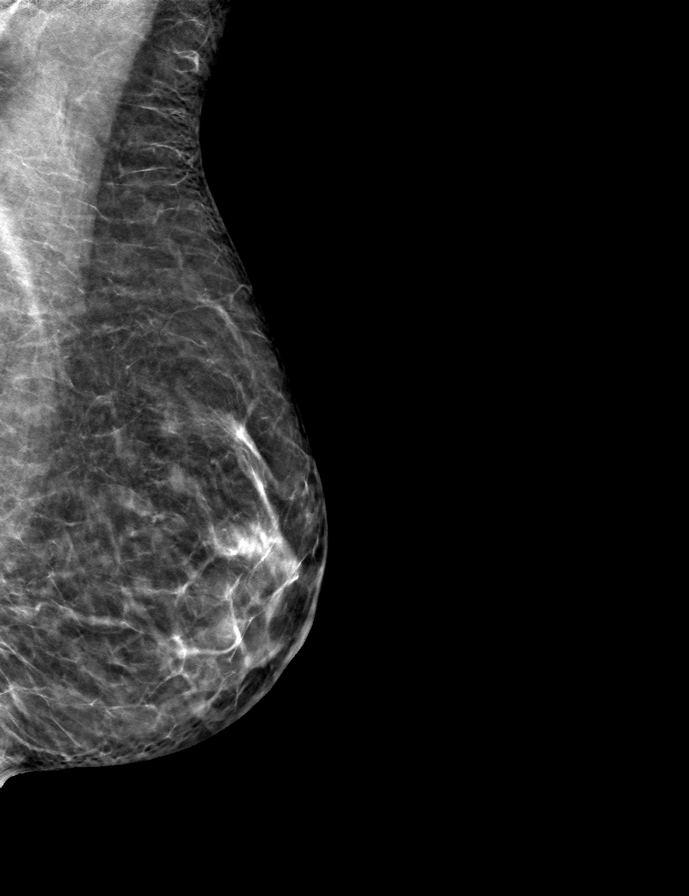

[9 of 24 positions shown; findings below may reference images not displayed]

ACR Breast Density Category b: There are scattered areas of
fibroglandular density.
FINDINGS: There are no findings suspicious for malignancy.
IMPRESSION: No mammographic evidence of malignancy. A result letter of this
screening mammogram will be mailed directly to the patient.

RECOMMENDATION:
Screening mammogram in one year. (Code:51-O-LD2)

BI-RADS CATEGORY  1: Negative.

## 2022-01-25 ENCOUNTER — Ambulatory Visit: Payer: Medicare HMO | Admitting: Family Medicine

## 2022-01-25 ENCOUNTER — Ambulatory Visit (INDEPENDENT_AMBULATORY_CARE_PROVIDER_SITE_OTHER): Payer: Medicare HMO

## 2022-01-25 DIAGNOSIS — Z0000 Encounter for general adult medical examination without abnormal findings: Secondary | ICD-10-CM

## 2022-01-25 NOTE — Patient Instructions (Signed)

## 2022-01-25 NOTE — Progress Notes (Signed)
Subjective:   Teresa Patterson is a 81 y.o. female who presents for Medicare Annual (Subsequent) preventive examination.  I connected with  Teresa Patterson on 01/25/22 by an audio only telemedicine application and verified that I am speaking with the correct person using two identifiers.   I discussed the limitations, risks, security and privacy concerns of performing an evaluation and management service by telephone and the availability of in person appointments. I also discussed with the patient that there may be a patient responsible charge related to this service. The patient expressed understanding and verbally consented to this telephonic visit.  Location of Patient: home Location of Provider:office  List any persons and their role that are participating in the visit with the patient.   [place], CMA Review of Systems    Defer to PCP       Objective:    There were no vitals filed for this visit. There is no height or weight on file to calculate BMI.     01/25/2022   10:20 AM 10/22/2013    9:35 AM  Advanced Directives  Does Patient Have a Medical Advance Directive? Yes Patient has advance directive, copy not in chart  Type of Advance Directive [place];Living will     Current Medications (verified) Outpatient Encounter Medications as of 01/25/2022  Medication Sig   Coenzyme Q10 (CO Q 10) 100 MG CAPS Take 1 capsule by mouth daily.   conjugated estrogens (PREMARIN) vaginal cream 1 applicator of cream  intravaginally 1-2 weekly qhs   fluticasone (FLONASE) 50 MCG/ACT nasal spray Place 2 sprays into both nostrils daily.   hydrochlorothiazide (HYDRODIURIL) 25 MG tablet Take 1 tablet (25 mg total) by mouth daily.   levothyroxine (SYNTHROID) 100 MCG tablet Take 1 tablet (100 mcg total) by mouth daily.   Multiple Vitamin (MULTIVITAMIN) tablet Take 1 tablet by mouth daily.   SUMAtriptan (IMITREX) 5 MG/ACT nasal spray Place 1 spray (5 mg total)  into the nose every 2 (two) hours as needed.   No facility-administered encounter medications on file as of 01/25/2022.    Allergies (verified) Fosamax [alendronate]   History: Past Medical History:  Diagnosis Date   Colon polyp    Hypothyroid    hypothyroid   Migraine    Multiple dysplastic nevi    established with Dr. Wilhemina Bonito dermatology   Osteopenia    Past Surgical History:  Procedure Laterality Date   ABDOMINAL HYSTERECTOMY  1972   CHOLECYSTECTOMY  1995   OVARIAN CYST REMOVAL Right    right   TONSILLECTOMY AND ADENOIDECTOMY  1952   Family History  Problem Relation Age of Onset   Brain cancer Brother        BRAIN TUMOR   Cancer Maternal Uncle        ?   Social History   Socioeconomic History   Marital status: Widowed    Spouse name: Not on file   Number of children: Not on file   Years of education: Not on file   Highest education level: Not on file  Occupational History   Not on file  Tobacco Use   Smoking status: Never   Smokeless tobacco: Never  Vaping Use   Vaping Use: Never used  Substance and Sexual Activity   Alcohol use: No    Alcohol/week: 0.0 standard drinks of alcohol   Drug use: No   Sexual activity: Not Currently    Partners: Male  Other Topics Concern  Not on file  Social History Narrative   Marital status/children/pets: Widowed. 3 children. Lives alone.    Education/employment: GED, retired.    Safety:      -smoke alarm in the home:Yes     - wears seatbelt: Yes     - Feels safe in their surroundings: Yes   Social Determinants of Health   Financial Resource Strain: Low Risk  (01/25/2022)   Overall Financial Resource Strain (CARDIA)    Difficulty of Paying Living Expenses: Not hard at all  Food Insecurity: No Food Insecurity (01/25/2022)   Hunger Vital Sign    Worried About Running Out of Food in the Last Year: Never true    Ran Out of Food in the Last Year: Never true  Transportation Needs: No Transportation Needs (01/25/2022)    PRAPARE - Hydrologist (Medical): No    Lack of Transportation (Non-Medical): No  Physical Activity: Insufficiently Active (01/25/2022)   Exercise Vital Sign    Days of Exercise per Week: 2 days    Minutes of Exercise per Session: 60 min  Stress: No Stress Concern Present (01/25/2022)   [place]    Feeling of Stress : Not at all  Social Connections: Socially Isolated (01/25/2022)   Social Connection and Isolation Panel [NHANES]    Frequency of Communication with Friends and Family: More than three times a week    Frequency of Social Gatherings with Friends and Family: More than three times a week    Attends Religious Services: Never    Marine scientist or Organizations: No    Attends Archivist Meetings: Never    Marital Status: Widowed    Tobacco Counseling Counseling given: Not Answered   Clinical Intake:  Pre-visit preparation completed: No  Pain : No/denies pain     Nutritional Risks: None  How often do you need to have someone help you when you read instructions, pamphlets, or other written materials from your doctor or pharmacy?: 1 - Never  Diabetic?no  Interpreter Needed?: No      Activities of Daily Living    01/25/2022   10:21 AM  In your present state of health, do you have any difficulty performing the following activities:  Hearing? 0  Vision? 0  Difficulty concentrating or making decisions? 0  Walking or climbing stairs? 0  Dressing or bathing? 0  Doing errands, shopping? 0  Preparing Food and eating ? N  Using the Toilet? N  In the past six months, have you accidently leaked urine? Y  Do you have problems with loss of bowel control? N  Managing your Medications? N  Managing your Finances? N  Housekeeping or managing your Housekeeping? N    Patient Care Team: Ma Hillock, DO as PCP - General (Family Medicine) Danella Sensing, MD as  Consulting Physician (Dermatology) Richmond Campbell, MD as Consulting Physician (Gastroenterology) Calvert Cantor, MD as Consulting Physician (Ophthalmology) Marica Otter, [place] (Optometry) Charlotte Crumb, MD as Consulting Physician (Orthopedic Surgery)  Indicate any recent Medical Services you may have received from other than [facility] providers in the past year (date may be approximate).     Assessment:   This is a routine wellness examination for Gimena.  Hearing/Vision screen No results found.  Dietary issues and exercise activities discussed:     Goals Addressed   None   Depression Screen    01/25/2022   10:19 AM 09/05/2021  8:30 AM 07/29/2020    9:03 AM 07/29/2019    9:02 AM 07/25/2018    9:13 AM 03/14/2018   10:28 AM 07/11/2016   10:32 AM  PHQ 2/9 Scores  PHQ - 2 Score 0 0 0 0 0 0 0    Fall Risk    01/25/2022   10:20 AM 09/05/2021    8:29 AM 07/29/2020    9:03 AM 07/29/2019    9:02 AM 07/25/2018    9:13 AM  Fall Risk   Falls in the past year? 0 0 0 0 0  Number falls in past yr: 0 0 0    Injury with Fall? 0 0 0    Risk for fall due to : No Fall Risks No Fall Risks     Follow up Falls evaluation completed Falls evaluation completed Falls evaluation completed Falls evaluation completed;Education provided;Falls prevention discussed Education provided;Falls evaluation completed    FALL RISK PREVENTION PERTAINING TO THE HOME:  Any stairs in or around the home? Yes  If so, are there any without handrails? Yes  Home free of loose throw rugs in walkways, pet beds, electrical cords, etc? Yes  Adequate lighting in your home to reduce risk of falls? Yes   ASSISTIVE DEVICES UTILIZED TO PREVENT FALLS:  Life alert? No  Use of a cane, walker or w/c? No  Grab bars in the bathroom? Yes  Shower chair or bench in shower? Yes  Elevated toilet seat or a handicapped toilet? Yes   TIMED UP AND GO:  Was the test performed? No .  Length of time to ambulate 10 feet: n/a sec.      Cognitive Function:        01/25/2022   10:21 AM  6CIT Screen  What Year? 0 points  What month? 0 points  What time? 0 points  Count back from 20 0 points  Repeat phrase 0 points    Immunizations Immunization History  Administered Date(s) Administered   H1N1 06/11/2008   Influenza Split 03/30/2011   Influenza Whole 06/04/2007, 02/14/2008, 06/16/2009   Influenza, High Dose Seasonal PF 02/08/2015, 01/30/2017, 02/15/2018, 02/24/2021   Influenza,inj,Quad PF,6+ Mos 02/14/2013, 01/08/2014   Influenza-Unspecified 02/24/2016, 02/15/2018, 02/20/2019, 03/03/2020   PFIZER(Purple Top)SARS-COV-2 Vaccination 06/26/2019, 07/22/2019, 02/19/2020, 02/24/2021   Pneumococcal Conjugate-13 10/22/2013   Pneumococcal Polysaccharide-23 05/17/2006   Td 03/28/2005, 07/11/2016   Zoster Recombinat (Shingrix) 10/31/2019, 12/31/2019    TDAP status: Up to date  Flu Vaccine status: Due, Education has been provided regarding the importance of this vaccine. Advised may receive this vaccine at local pharmacy or Health Dept. Aware to provide a copy of the vaccination record if obtained from local pharmacy or Health Dept. Verbalized acceptance and understanding.  Pneumococcal vaccine status: Up to date  Covid-19 vaccine status: Completed vaccines  Qualifies for Shingles Vaccine? Yes   Zostavax completed No   Shingrix Completed?: Yes  Screening Tests Health Maintenance  Topic Date Due   INFLUENZA VACCINE  12/20/2021   COVID-19 Vaccine (5 - Pfizer risk series) 09/22/2023 (Originally 04/21/2021)   MAMMOGRAM  11/17/2022   DEXA SCAN  11/05/2024   TETANUS/TDAP  07/11/2026   Pneumonia Vaccine 32+ Years old  Completed   Zoster Vaccines- Shingrix  Completed   HPV VACCINES  Aged Out    Health Maintenance  Health Maintenance Due  Topic Date Due   INFLUENZA VACCINE  12/20/2021    Colorectal cancer screening: No longer required.   Mammogram status: Completed 11/16/2020. Repeat every year  Bone  Density status: Completed 11/06/2019. Results reflect: Bone density results: OSTEOPENIA. Repeat every 5 years.  Lung Cancer Screening: (Low Dose CT Chest recommended if Age 12-80 years, 30 pack-year currently smoking OR have quit w/in 15years.) does not qualify.   Lung Cancer Screening Referral: n/a  Additional Screening:  Hepatitis C Screening: does not qualify; Completed n/a  Vision Screening: Recommended annual ophthalmology exams for early detection of glaucoma and other disorders of the eye. Is the patient up to date with their annual eye exam?  Yes  Who is the provider or what is the name of the office in which the patient attends annual eye exams? Dr. Bing Plume If pt is not established with a provider, would they like to be referred to a provider to establish care? No .   Dental Screening: Recommended annual dental exams for proper oral hygiene  Community Resource Referral / Chronic Care Management: CRR required this visit?  No   CCM required this visit?  No      Plan:     I have personally reviewed and noted the following in the patient's chart:   Medical and social history Use of alcohol, tobacco or illicit drugs  Current medications and supplements including opioid prescriptions. Patient is not currently taking opioid prescriptions. Functional ability and status Nutritional status Physical activity Advanced directives List of other physicians Hospitalizations, surgeries, and ER visits in previous 12 months Vitals Screenings to include cognitive, depression, and falls Referrals and appointments  In addition, I have reviewed and discussed with patient certain preventive protocols, quality metrics, and best practice recommendations. A written personalized care plan for preventive services as well as general preventive health recommendations were provided to patient.     Octaviano Glow, CMA   01/25/2022   Nurse Notes: Non-Face to Face or Face to Face 12 minute visit  Encounter   Ms. Cabana , Thank you for taking time to come for your Medicare Wellness Visit. I appreciate your ongoing commitment to your health goals. Please review the following plan we discussed and let me know if I can assist you in the future.   These are the goals we discussed:  Goals   None     This is a list of the screening recommended for you and due dates:  Health Maintenance  Topic Date Due   Flu Shot  12/20/2021   COVID-19 Vaccine (5 - Pfizer risk series) 09/22/2023*   Mammogram  11/17/2022   DEXA scan (bone density measurement)  11/05/2024   Tetanus Vaccine  07/11/2026   Pneumonia Vaccine  Completed   Zoster (Shingles) Vaccine  Completed   HPV Vaccine  Aged Out  *Topic was postponed. The date shown is not the original due date.

## 2022-02-09 DIAGNOSIS — N3281 Overactive bladder: Secondary | ICD-10-CM | POA: Diagnosis not present

## 2022-02-23 ENCOUNTER — Ambulatory Visit
Admission: RE | Admit: 2022-02-23 | Discharge: 2022-02-23 | Disposition: A | Discharge status: Home / Self Care | Payer: Medicare HMO | Source: Ambulatory Visit | Attending: Family Medicine | Admitting: Family Medicine

## 2022-02-23 DIAGNOSIS — Z1231 Encounter for screening mammogram for malignant neoplasm of breast: Secondary | ICD-10-CM

## 2022-02-23 DIAGNOSIS — M8589 Other specified disorders of bone density and structure, multiple sites: Secondary | ICD-10-CM | POA: Diagnosis not present

## 2022-02-23 DIAGNOSIS — M8580 Other specified disorders of bone density and structure, unspecified site: Secondary | ICD-10-CM

## 2022-02-23 DIAGNOSIS — Z780 Asymptomatic menopausal state: Secondary | ICD-10-CM | POA: Diagnosis not present

## 2022-02-24 ENCOUNTER — Telehealth: Payer: Self-pay | Admitting: Family Medicine

## 2022-02-24 NOTE — Telephone Encounter (Signed)
Caffeine decreases bone density by decreasing calcium absorption. She is unable to tolerate Fosamax-we have discussed this after each DEXA she has had completed.  She is not yet a candidate for Prolia ejections, because she does not have an osteoporosis diagnosis yet. She should continue her current vitamin D dose that has maintained her normal levels thus far.  Levels are normal in April. If she desires any further information please encourage her to make an appointment to discuss with provider

## 2022-02-24 NOTE — Telephone Encounter (Signed)
Spoke with patient regarding results/recommendations.  

## 2022-02-24 NOTE — Telephone Encounter (Signed)
Spoke with patient regarding results/recommendations. Pt would like to know what caffeine does to the bones. She states she is taking vit d3  '50mg'$  should she add any other supplements or medications to slow process.

## 2022-02-24 NOTE — Telephone Encounter (Signed)
Please inform patient she has decreased bone density from last scan in 2021.  Her score is -2.4.  This is very close to the osteoporosis range now, which starts at -2.5. We will repeat her DEXA in 2 years  Basic recommendations for osteopenia/osteoporosis: 1.) Drink alcohol in moderation only: No more than 2 drinks a day  2.) Decrease caffeine consumption to no  More than 2.5 cups /d 3.) Exercise: weight bearing (walking counts), strength and balance training. 4.) No smoking.  5.) Sunlight/Ultraviolet light exposure 30 minutes a day/5 days a week. 6.)  Maintain adequate vitamin D levels

## 2022-03-06 DIAGNOSIS — L814 Other melanin hyperpigmentation: Secondary | ICD-10-CM | POA: Diagnosis not present

## 2022-03-06 DIAGNOSIS — D2261 Melanocytic nevi of right upper limb, including shoulder: Secondary | ICD-10-CM | POA: Diagnosis not present

## 2022-03-06 DIAGNOSIS — D2262 Melanocytic nevi of left upper limb, including shoulder: Secondary | ICD-10-CM | POA: Diagnosis not present

## 2022-03-06 DIAGNOSIS — D225 Melanocytic nevi of trunk: Secondary | ICD-10-CM | POA: Diagnosis not present

## 2022-03-06 DIAGNOSIS — L720 Epidermal cyst: Secondary | ICD-10-CM | POA: Diagnosis not present

## 2022-03-06 DIAGNOSIS — L570 Actinic keratosis: Secondary | ICD-10-CM | POA: Diagnosis not present

## 2022-03-06 DIAGNOSIS — L821 Other seborrheic keratosis: Secondary | ICD-10-CM | POA: Diagnosis not present

## 2022-03-06 DIAGNOSIS — D2272 Melanocytic nevi of left lower limb, including hip: Secondary | ICD-10-CM | POA: Diagnosis not present

## 2022-03-06 DIAGNOSIS — D2271 Melanocytic nevi of right lower limb, including hip: Secondary | ICD-10-CM | POA: Diagnosis not present

## 2022-03-06 DIAGNOSIS — D485 Neoplasm of uncertain behavior of skin: Secondary | ICD-10-CM | POA: Diagnosis not present

## 2022-03-16 DIAGNOSIS — N3281 Overactive bladder: Secondary | ICD-10-CM | POA: Diagnosis not present

## 2022-03-30 ENCOUNTER — Telehealth: Payer: Self-pay | Admitting: Family Medicine

## 2022-05-19 DIAGNOSIS — N3281 Overactive bladder: Secondary | ICD-10-CM | POA: Diagnosis not present

## 2022-07-07 DIAGNOSIS — N898 Other specified noninflammatory disorders of vagina: Secondary | ICD-10-CM | POA: Diagnosis not present

## 2022-09-07 ENCOUNTER — Encounter: Payer: Self-pay | Admitting: Family Medicine

## 2022-09-07 ENCOUNTER — Ambulatory Visit (INDEPENDENT_AMBULATORY_CARE_PROVIDER_SITE_OTHER): Payer: Medicare HMO | Admitting: Family Medicine

## 2022-09-07 VITALS — BP 135/73 | HR 53 | Temp 97.8°F | Ht 63.78 in | Wt 130.4 lb

## 2022-09-07 DIAGNOSIS — R490 Dysphonia: Secondary | ICD-10-CM

## 2022-09-07 DIAGNOSIS — Z1231 Encounter for screening mammogram for malignant neoplasm of breast: Secondary | ICD-10-CM

## 2022-09-07 DIAGNOSIS — G43009 Migraine without aura, not intractable, without status migrainosus: Secondary | ICD-10-CM

## 2022-09-07 DIAGNOSIS — N951 Menopausal and female climacteric states: Secondary | ICD-10-CM | POA: Diagnosis not present

## 2022-09-07 DIAGNOSIS — E034 Atrophy of thyroid (acquired): Secondary | ICD-10-CM | POA: Diagnosis not present

## 2022-09-07 DIAGNOSIS — M899 Disorder of bone, unspecified: Secondary | ICD-10-CM

## 2022-09-07 DIAGNOSIS — R609 Edema, unspecified: Secondary | ICD-10-CM | POA: Diagnosis not present

## 2022-09-07 DIAGNOSIS — Z13220 Encounter for screening for lipoid disorders: Secondary | ICD-10-CM | POA: Diagnosis not present

## 2022-09-07 DIAGNOSIS — J302 Other seasonal allergic rhinitis: Secondary | ICD-10-CM | POA: Diagnosis not present

## 2022-09-07 DIAGNOSIS — G43701 Chronic migraine without aura, not intractable, with status migrainosus: Secondary | ICD-10-CM

## 2022-09-07 DIAGNOSIS — Z0000 Encounter for general adult medical examination without abnormal findings: Secondary | ICD-10-CM

## 2022-09-07 DIAGNOSIS — Z131 Encounter for screening for diabetes mellitus: Secondary | ICD-10-CM | POA: Diagnosis not present

## 2022-09-07 DIAGNOSIS — M8580 Other specified disorders of bone density and structure, unspecified site: Secondary | ICD-10-CM

## 2022-09-07 DIAGNOSIS — N1831 Chronic kidney disease, stage 3a: Secondary | ICD-10-CM

## 2022-09-07 LAB — COMPREHENSIVE METABOLIC PANEL
ALT: 7 U/L (ref 0–35)
AST: 15 U/L (ref 0–37)
Albumin: 4.1 g/dL (ref 3.5–5.2)
Alkaline Phosphatase: 49 U/L (ref 39–117)
BUN: 10 mg/dL (ref 6–23)
CO2: 29 mEq/L (ref 19–32)
Calcium: 9.7 mg/dL (ref 8.4–10.5)
Chloride: 103 mEq/L (ref 96–112)
Creatinine, Ser: 0.89 mg/dL (ref 0.40–1.20)
GFR: 60.44 mL/min (ref >60.00)
Glucose, Bld: 91 mg/dL (ref 70–99)
Potassium: 3.5 mEq/L (ref 3.5–5.1)
Sodium: 139 mEq/L (ref 135–145)
Total Bilirubin: 0.6 mg/dL (ref 0.2–1.2)
Total Protein: 6.3 g/dL (ref 6.0–8.3)

## 2022-09-07 LAB — CBC WITH DIFFERENTIAL/PLATELET
Basophils Absolute: 0.1 10*3/uL (ref 0.0–0.1)
Basophils Relative: 1.9 % (ref 0.0–3.0)
Eosinophils Absolute: 0.2 10*3/uL (ref 0.0–0.7)
Eosinophils Relative: 4.4 % (ref 0.0–5.0)
HCT: 35.6 % — ABNORMAL LOW (ref 36.0–46.0)
Hemoglobin: 11.8 g/dL — ABNORMAL LOW (ref 12.0–15.0)
Lymphocytes Relative: 37.2 % (ref 12.0–46.0)
Lymphs Abs: 1.8 10*3/uL (ref 0.7–4.0)
MCHC: 33.2 g/dL (ref 30.0–36.0)
MCV: 88.2 fl (ref 78.0–100.0)
Monocytes Absolute: 0.4 10*3/uL (ref 0.1–1.0)
Monocytes Relative: 8.2 % (ref 3.0–12.0)
Neutro Abs: 2.3 10*3/uL (ref 1.4–7.7)
Neutrophils Relative %: 48.3 % (ref 43.0–77.0)
Platelets: 178 10*3/uL (ref 150.0–400.0)
RBC: 4.03 Mil/uL (ref 3.87–5.11)
RDW: 15.4 % (ref 11.5–15.5)
WBC: 4.8 10*3/uL (ref 4.0–10.5)

## 2022-09-07 LAB — LIPID PANEL
Cholesterol: 148 mg/dL (ref 0–200)
HDL: 64.5 mg/dL (ref >39.00)
LDL Cholesterol: 69 mg/dL (ref 0–99)
NonHDL: 83.45
Total CHOL/HDL Ratio: 2
Triglycerides: 74 mg/dL (ref 0.0–149.0)
VLDL: 14.8 mg/dL (ref 0.0–40.0)

## 2022-09-07 LAB — HEMOGLOBIN A1C: Hgb A1c MFr Bld: 5.5 % (ref 4.6–6.5)

## 2022-09-07 LAB — VITAMIN D 25 HYDROXY (VIT D DEFICIENCY, FRACTURES): VITD: 36.3 ng/mL (ref 30.00–100.00)

## 2022-09-07 LAB — TSH: TSH: 2.25 u[IU]/mL (ref 0.35–5.50)

## 2022-09-07 MED ORDER — HYDROCHLOROTHIAZIDE 25 MG PO TABS: 25.0000 mg | ORAL_TABLET | Freq: Every day | ORAL | 4 refills | Status: DC

## 2022-09-07 MED ORDER — SUMATRIPTAN 5 MG/ACT NA SOLN: 1.0000 | NASAL | 3 refills | Status: DC | PRN

## 2022-09-07 MED ORDER — ESTROGENS CONJUGATED 0.625 MG/GM VA CREA: TOPICAL_CREAM | VAGINAL | 12 refills | Status: DC

## 2022-09-07 MED ORDER — FLUTICASONE PROPIONATE 50 MCG/ACT NA SUSP: 2.0000 | Freq: Every day | NASAL | 6 refills | Status: AC

## 2022-09-07 NOTE — Progress Notes (Signed)
Patient ID: Teresa Patterson, female  DOB: [birth year], 82 y.o.   MRN: [id number] Patient Care Team    Relationship Specialty Notifications Start End  Natalia Leatherwood, DO PCP - General Family Medicine  03/14/18   Arminda Resides, MD Consulting Physician Dermatology  03/14/18   Sharrell Ku, MD Consulting Physician Gastroenterology  03/14/18   Nelson Chimes, MD Consulting Physician Ophthalmology  03/14/18   Blima Ledger, OD  Optometry  03/14/18   Dairl Ponder, MD Consulting Physician Orthopedic Surgery  03/14/18     Chief Complaint  Patient presents with   Annual Exam    [facility]; pt is fasting    Subjective:  Teresa Patterson is a 82 y.o.  Female  present for CPE and Chronic Conditions/illness Management  All past medical history, surgical history, allergies, family history, immunizations, medications and social history were updated in the electronic medical record today. All recent labs, ED visits and hospitalizations within the last year were reviewed.  Health maintenance:  Mammogram: completed: 02/23/2022- [facility] center[place] ordered tfor 2024 Immunizations: tdap UTD 2018, Influenza 2023 UTD (encouraged yearly), PNA series completed (after 65), shingrix completed.  DEXA: 2023 > (-2.4-osteopenia). Rpt 2 yrs Assistive device: none Oxygen MWN:UUVO Patient has a Dental home. Hospitalizations/ED visits: reviewed  Hypothyroidism, unspecified type Patient reports compliance with levothyroxine 100 mcg daily     Osteoporosis, unspecified osteoporosis type, unspecified pathological fracture presence DEXA 2023 with osteopenia -2.4 T-score   postmenopausal changes:  Patient reports her symptoms are stable with Premarin cream 2 times a week.   migraine without status migrainosus, not intractable Patient reports her migraines are stable, usually suffers from a couple of migraines a year.  Uses Imitrex in those cases-which works well   Edema:  Patient reports her edema is  well-controlled she uses HCTZ 25 mg daily.  Potassium has been stable      09/07/2022    9:02 AM 01/25/2022   10:19 AM 09/05/2021    8:30 AM 07/29/2020    9:03 AM 07/29/2019    9:02 AM  Depression screen PHQ 2/9  Decreased Interest 0 0 0 0 0  Down, Depressed, Hopeless 0 0 0 0 0  PHQ - 2 Score 0 0 0 0 0       No data to display          Immunization History  Administered Date(s) Administered   Fluad Quad(high Dose 65+) 02/09/2022   H1N1 06/11/2008   Influenza Split 03/30/2011   Influenza Whole 06/04/2007, 02/14/2008, 06/16/2009   Influenza, High Dose Seasonal PF 02/08/2015, 01/30/2017, 02/15/2018, 02/24/2021   Influenza,inj,Quad PF,6+ Mos 02/14/2013, 01/08/2014   Influenza-Unspecified 02/24/2016, 02/15/2018, 02/20/2019, 03/03/2020   PFIZER(Purple Top)SARS-COV-2 Vaccination 06/26/2019, 07/22/2019, 02/19/2020, 02/24/2021   Pneumococcal Conjugate-13 10/22/2013   Pneumococcal Polysaccharide-23 05/17/2006   Respiratory Syncytial Virus Vaccine,Recomb Aduvanted(Arexvy) 02/09/2022   Td 03/28/2005, 07/11/2016   Zoster Recombinat (Shingrix) 10/31/2019, 12/31/2019     Past Medical History:  Diagnosis Date   Colon polyp    Hypothyroid    hypothyroid   Migraine    Multiple dysplastic nevi    established with Dr. Karlyn Agee dermatology   Osteopenia    Allergies  Allergen Reactions   Fosamax [Alendronate]    Past Surgical History:  Procedure Laterality Date   ABDOMINAL HYSTERECTOMY  1972   CHOLECYSTECTOMY  1995   OVARIAN CYST REMOVAL Right    right   TONSILLECTOMY AND ADENOIDECTOMY  1952   Family History  Problem Relation Age of  Onset   Brain cancer Brother        BRAIN TUMOR   Cancer Maternal Uncle        ?   Social History   Social History Narrative   Marital status/children/pets: Widowed. 3 children. Lives alone.    Education/employment: GED, retired.    Safety:      -smoke alarm in the home:Yes     - wears seatbelt: Yes     - Feels safe in their surroundings:  Yes    Allergies as of 09/07/2022       Reactions   Fosamax [alendronate]         Medication List        Accurate as of September 07, 2022  9:22 AM. If you have any questions, ask your nurse or doctor.          Co Q 10 100 MG Caps Take 1 capsule by mouth daily.   conjugated estrogens vaginal cream Commonly known as: PREMARIN 1 applicator of cream  intravaginally 1-2 weekly qhs   fluticasone 50 MCG/ACT nasal spray Commonly known as: FLONASE Place 2 sprays into both nostrils daily.   hydrochlorothiazide 25 MG tablet Commonly known as: HYDRODIURIL Take 1 tablet (25 mg total) by mouth daily.   levothyroxine 100 MCG tablet Commonly known as: SYNTHROID Take 1 tablet (100 mcg total) by mouth daily.   multivitamin tablet Take 1 tablet by mouth daily.   oxybutynin 10 MG 24 hr tablet Commonly known as: DITROPAN-XL Take 10 mg by mouth daily.   SUMAtriptan 5 MG/ACT nasal spray Commonly known as: IMITREX Place 1 spray (5 mg total) into the nose every 2 (two) hours as needed.        All past medical history, surgical history, allergies, family history, immunizations andmedications were updated in the EMR today and reviewed under the history and medication portions of their EMR.     No results found for this or any previous visit (from the past 2160 hour(s)).  ROS 14 pt review of systems performed and negative (unless mentioned in an HPI)  Objective: BP 135/73   Pulse (!) 53   Temp 97.8 F (36.6 C)   Ht 5' 3.78" (1.62 m)   Wt 130 lb 6.4 oz (59.1 kg)   SpO2 99%   BMI 22.54 kg/m  Physical Exam Vitals and nursing note reviewed.  Constitutional:      General: She is not in acute distress.    Appearance: Normal appearance. She is not ill-appearing or toxic-appearing.  HENT:     Head: Normocephalic and atraumatic.     Right Ear: Tympanic membrane, ear canal and external ear normal. There is no impacted cerumen.     Left Ear: Tympanic membrane, ear canal and  external ear normal. There is no impacted cerumen.     Nose: No congestion or rhinorrhea.     Mouth/Throat:     Mouth: Mucous membranes are moist.     Pharynx: Oropharynx is clear. No oropharyngeal exudate or posterior oropharyngeal erythema.  Eyes:     General: No scleral icterus.       Right eye: No discharge.        Left eye: No discharge.     Extraocular Movements: Extraocular movements intact.     Conjunctiva/sclera: Conjunctivae normal.     Pupils: Pupils are equal, round, and reactive to light.  Cardiovascular:     Rate and Rhythm: Normal rate and regular rhythm.     Pulses: Normal  pulses.     Heart sounds: Normal heart sounds. No murmur heard.    No friction rub. No gallop.  Pulmonary:     Effort: Pulmonary effort is normal. No respiratory distress.     Breath sounds: Normal breath sounds. No stridor. No wheezing, rhonchi or rales.  Chest:     Chest wall: No tenderness.  Abdominal:     General: Abdomen is flat. Bowel sounds are normal. There is no distension.     Palpations: Abdomen is soft. There is no mass.     Tenderness: There is no abdominal tenderness. There is no right CVA tenderness, left CVA tenderness, guarding or rebound.     Hernia: No hernia is present.  Musculoskeletal:        General: No swelling, tenderness or deformity. Normal range of motion.     Cervical back: Normal range of motion and neck supple. No rigidity or tenderness.     Right lower leg: No edema.     Left lower leg: No edema.  Lymphadenopathy:     Cervical: No cervical adenopathy.  Skin:    General: Skin is warm and dry.     Coloration: Skin is not jaundiced or pale.     Findings: No bruising, erythema, lesion or rash.  Neurological:     General: No focal deficit present.     Mental Status: She is alert and oriented to person, place, and time. Mental status is at baseline.     Cranial Nerves: No cranial nerve deficit.     Sensory: No sensory deficit.     Motor: No weakness.      Coordination: Coordination normal.     Gait: Gait normal.     Deep Tendon Reflexes: Reflexes normal.  Psychiatric:        Mood and Affect: Mood normal.        Behavior: Behavior normal.        Thought Content: Thought content normal.        Judgment: Judgment normal.      No results found.  Assessment/plan: Teresa Patterson is a 82 y.o. female present for annual physical and routine chronic conditions Edema, unspecified type Stable Continue HCTZ 25 mg CMP collected today   Hypothyroidism, unspecified type Has been stable. Continue levothyroxine 100 mcg qd.  Refills will be provided in appropriate dose after lab results received  Osteopenia, unspecified location Continue vitamin D supplementation. Bone density completed 2023, repeat 2 years -2.4 T-score Vitamin D collected today  Chronic migraine without aura with status migrainosus, not intractable Stable Continue imitrex prn  Menopausal symptom Stable Continue Premarin cream 1-2 times a week  Seasonal allergies/hoarseness Stable Continue flonase PRN Start allegra daily Referral to ENT placed today- she has concerns about cancer. Husband had throat cancer.   Routine general medical examination at a health care facility Mammogram: completed: 02/23/2022- breast center[place] ordered tfor 2024 Immunizations: tdap UTD 2018, Influenza 2023 UTD (encouraged yearly), PNA series completed (after 65), shingrix completed.  DEXA: 2023 > (-2.4-osteopenia). Rpt 2 yrs Patient was encouraged to exercise greater than 150 minutes a week. Patient was encouraged to choose a diet filled with fresh fruits and vegetables, and lean meats. AVS provided to patient today for education/recommendation on gender specific health and safety maintenance.  Return in about 1 year (around 09/08/2023) for cpe (20 min).  Orders Placed This Encounter  Procedures   MM 3D SCREENING MAMMOGRAM BILATERAL BREAST   CBC with Differential/Platelet    Comprehensive metabolic panel  Hemoglobin A1c   Lipid panel   TSH   Vitamin D (25 hydroxy)   Ambulatory referral to ENT   Meds ordered this encounter  Medications   conjugated estrogens (PREMARIN) vaginal cream    Sig: 1 applicator of cream  intravaginally 1-2 weekly qhs    Dispense:  42.5 g    Refill:  12    Please hold script. Please do not fill until pt request.   fluticasone (FLONASE) 50 MCG/ACT nasal spray    Sig: Place 2 sprays into both nostrils daily.    Dispense:  16 g    Refill:  6    Hold until pt request   hydrochlorothiazide (HYDRODIURIL) 25 MG tablet    Sig: Take 1 tablet (25 mg total) by mouth daily.    Dispense:  90 tablet    Refill:  4   SUMAtriptan (IMITREX) 5 MG/ACT nasal spray    Sig: Place 1 spray (5 mg total) into the nose every 2 (two) hours as needed.    Dispense:  6 each    Refill:  3   Referral Orders         Ambulatory referral to ENT      Electronically signed by: Felix Pacini, DO [facility] [place]

## 2022-09-07 NOTE — Patient Instructions (Addendum)
Return in about 1 year (around 09/08/2023) for cpe (20 min).        Great to see you today.  I have refilled the medication(s) we provide.   If labs were collected, we will inform you of lab results once received either by echart message or telephone call.   - echart message- for normal results that have been seen by the patient already.   - telephone call: abnormal results or if patient has not viewed results in their echart.

## 2022-09-08 ENCOUNTER — Other Ambulatory Visit: Payer: Self-pay | Admitting: Family Medicine

## 2022-09-08 MED ORDER — LEVOTHYROXINE SODIUM 100 MCG PO TABS: 100.0000 ug | ORAL_TABLET | Freq: Every day | ORAL | 4 refills | Status: DC

## 2022-10-23 ENCOUNTER — Emergency Department (HOSPITAL_COMMUNITY): Payer: Medicare HMO

## 2022-10-23 ENCOUNTER — Ambulatory Visit: Payer: Self-pay | Admitting: *Deleted

## 2022-10-23 ENCOUNTER — Other Ambulatory Visit: Payer: Self-pay

## 2022-10-23 ENCOUNTER — Inpatient Hospital Stay (HOSPITAL_COMMUNITY)
Admission: EM | Admit: 2022-10-23 | Discharge: 2022-10-27 | DRG: 522 | Disposition: A | Discharge status: Home Health | Payer: Medicare HMO | Attending: Student | Admitting: Student

## 2022-10-23 ENCOUNTER — Encounter (HOSPITAL_COMMUNITY): Payer: Self-pay

## 2022-10-23 DIAGNOSIS — E876 Hypokalemia: Secondary | ICD-10-CM | POA: Diagnosis not present

## 2022-10-23 DIAGNOSIS — E871 Hypo-osmolality and hyponatremia: Secondary | ICD-10-CM | POA: Diagnosis not present

## 2022-10-23 DIAGNOSIS — Y9301 Activity, walking, marching and hiking: Secondary | ICD-10-CM | POA: Diagnosis present

## 2022-10-23 DIAGNOSIS — W1809XA Striking against other object with subsequent fall, initial encounter: Secondary | ICD-10-CM | POA: Diagnosis not present

## 2022-10-23 DIAGNOSIS — M8580 Other specified disorders of bone density and structure, unspecified site: Secondary | ICD-10-CM | POA: Diagnosis present

## 2022-10-23 DIAGNOSIS — G43909 Migraine, unspecified, not intractable, without status migrainosus: Secondary | ICD-10-CM | POA: Diagnosis present

## 2022-10-23 DIAGNOSIS — D696 Thrombocytopenia, unspecified: Secondary | ICD-10-CM | POA: Diagnosis present

## 2022-10-23 DIAGNOSIS — R9431 Abnormal electrocardiogram [ECG] [EKG]: Secondary | ICD-10-CM | POA: Diagnosis not present

## 2022-10-23 DIAGNOSIS — S72011A Unspecified intracapsular fracture of right femur, initial encounter for closed fracture: Secondary | ICD-10-CM | POA: Diagnosis not present

## 2022-10-23 DIAGNOSIS — Z90710 Acquired absence of both cervix and uterus: Secondary | ICD-10-CM

## 2022-10-23 DIAGNOSIS — Z96641 Presence of right artificial hip joint: Secondary | ICD-10-CM | POA: Diagnosis not present

## 2022-10-23 DIAGNOSIS — S72001A Fracture of unspecified part of neck of right femur, initial encounter for closed fracture: Principal | ICD-10-CM

## 2022-10-23 DIAGNOSIS — Z79899 Other long term (current) drug therapy: Secondary | ICD-10-CM

## 2022-10-23 DIAGNOSIS — Z86010 Personal history of colonic polyps: Secondary | ICD-10-CM | POA: Diagnosis not present

## 2022-10-23 DIAGNOSIS — D509 Iron deficiency anemia, unspecified: Secondary | ICD-10-CM | POA: Diagnosis not present

## 2022-10-23 DIAGNOSIS — E034 Atrophy of thyroid (acquired): Secondary | ICD-10-CM | POA: Diagnosis not present

## 2022-10-23 DIAGNOSIS — S72141A Displaced intertrochanteric fracture of right femur, initial encounter for closed fracture: Secondary | ICD-10-CM | POA: Diagnosis present

## 2022-10-23 DIAGNOSIS — M25551 Pain in right hip: Secondary | ICD-10-CM | POA: Diagnosis not present

## 2022-10-23 DIAGNOSIS — T502X5A Adverse effect of carbonic-anhydrase inhibitors, benzothiadiazides and other diuretics, initial encounter: Secondary | ICD-10-CM | POA: Diagnosis not present

## 2022-10-23 DIAGNOSIS — E039 Hypothyroidism, unspecified: Secondary | ICD-10-CM | POA: Diagnosis present

## 2022-10-23 DIAGNOSIS — Z808 Family history of malignant neoplasm of other organs or systems: Secondary | ICD-10-CM

## 2022-10-23 DIAGNOSIS — Z79890 Hormone replacement therapy (postmenopausal): Secondary | ICD-10-CM

## 2022-10-23 DIAGNOSIS — W19XXXA Unspecified fall, initial encounter: Secondary | ICD-10-CM | POA: Diagnosis not present

## 2022-10-23 DIAGNOSIS — G8911 Acute pain due to trauma: Secondary | ICD-10-CM | POA: Diagnosis not present

## 2022-10-23 DIAGNOSIS — Z471 Aftercare following joint replacement surgery: Secondary | ICD-10-CM | POA: Diagnosis not present

## 2022-10-23 DIAGNOSIS — I10 Essential (primary) hypertension: Secondary | ICD-10-CM | POA: Diagnosis present

## 2022-10-23 DIAGNOSIS — Y92239 Unspecified place in hospital as the place of occurrence of the external cause: Secondary | ICD-10-CM | POA: Diagnosis not present

## 2022-10-23 DIAGNOSIS — Z01818 Encounter for other preprocedural examination: Secondary | ICD-10-CM | POA: Diagnosis not present

## 2022-10-23 DIAGNOSIS — Z888 Allergy status to other drugs, medicaments and biological substances status: Secondary | ICD-10-CM

## 2022-10-23 DIAGNOSIS — S72041A Displaced fracture of base of neck of right femur, initial encounter for closed fracture: Secondary | ICD-10-CM | POA: Diagnosis not present

## 2022-10-23 DIAGNOSIS — Z9049 Acquired absence of other specified parts of digestive tract: Secondary | ICD-10-CM

## 2022-10-23 DIAGNOSIS — Y92009 Unspecified place in unspecified non-institutional (private) residence as the place of occurrence of the external cause: Secondary | ICD-10-CM | POA: Diagnosis not present

## 2022-10-23 LAB — TYPE AND SCREEN
ABO/RH(D): O POS
Antibody Screen: NEGATIVE

## 2022-10-23 LAB — COMPREHENSIVE METABOLIC PANEL
ALT: 12 U/L (ref 0–44)
AST: 18 U/L (ref 15–41)
Albumin: 3.7 g/dL (ref 3.5–5.0)
Alkaline Phosphatase: 44 U/L (ref 38–126)
Anion gap: 10 (ref 5–15)
BUN: 11 mg/dL (ref 8–23)
CO2: 25 mmol/L (ref 22–32)
Calcium: 9.7 mg/dL (ref 8.9–10.3)
Chloride: 103 mmol/L (ref 98–111)
Creatinine, Ser: 0.81 mg/dL (ref 0.44–1.00)
GFR, Estimated: >60 mL/min (ref >60)
Glucose, Bld: 109 mg/dL — ABNORMAL HIGH (ref 70–99)
Potassium: 3.4 mmol/L — ABNORMAL LOW (ref 3.5–5.1)
Sodium: 138 mmol/L (ref 135–145)
Total Bilirubin: 0.7 mg/dL (ref 0.3–1.2)
Total Protein: 6.1 g/dL — ABNORMAL LOW (ref 6.5–8.1)

## 2022-10-23 LAB — CBC
HCT: 38.1 % (ref 36.0–46.0)
Hemoglobin: 12.7 g/dL (ref 12.0–15.0)
MCH: 29.7 pg (ref 26.0–34.0)
MCHC: 33.3 g/dL (ref 30.0–36.0)
MCV: 89 fL (ref 80.0–100.0)
Platelets: 155 10*3/uL (ref 150–400)
RBC: 4.28 MIL/uL (ref 3.87–5.11)
RDW: 14 % (ref 11.5–15.5)
WBC: 13 10*3/uL — ABNORMAL HIGH (ref 4.0–10.5)
nRBC: 0 % (ref 0.0–0.2)

## 2022-10-23 LAB — PROTIME-INR
INR: 1.2 (ref 0.8–1.2)
Prothrombin Time: 15 seconds (ref 11.4–15.2)

## 2022-10-23 LAB — ABO/RH: ABO/RH(D): O POS

## 2022-10-23 MED ORDER — POLYETHYLENE GLYCOL 3350 17 G PO PACK: 17.0000 g | PACK | Freq: Every day | ORAL | Status: DC | PRN

## 2022-10-23 MED ORDER — HYDROCODONE-ACETAMINOPHEN 5-325 MG PO TABS
1.0000 | ORAL_TABLET | Freq: Four times a day (QID) | ORAL | Status: DC | PRN
  Administered 2022-10-24 – 2022-10-25 (×4): 2 via ORAL
  Administered 2022-10-26: 1 via ORAL
  Administered 2022-10-26: 2 via ORAL
  Filled 2022-10-23: qty 1
  Filled 2022-10-23 (×5): qty 2

## 2022-10-23 MED ORDER — MORPHINE SULFATE (PF) 2 MG/ML IV SOLN
0.5000 mg | INTRAVENOUS | Status: DC | PRN
  Administered 2022-10-23: 2 mg via INTRAVENOUS
  Filled 2022-10-23: qty 1

## 2022-10-23 MED ORDER — FENTANYL CITRATE PF 50 MCG/ML IJ SOSY
50.0000 ug | PREFILLED_SYRINGE | INTRAMUSCULAR | Status: AC | PRN
  Administered 2022-10-23 (×2): 50 ug via INTRAVENOUS
  Filled 2022-10-23 (×2): qty 1

## 2022-10-23 MED ORDER — METHOCARBAMOL 500 MG PO TABS
500.0000 mg | ORAL_TABLET | Freq: Four times a day (QID) | ORAL | Status: DC | PRN
  Administered 2022-10-24: 500 mg via ORAL
  Filled 2022-10-23: qty 1

## 2022-10-23 MED ORDER — LACTATED RINGERS IV SOLN: INTRAVENOUS | Status: DC

## 2022-10-23 MED ORDER — OXYBUTYNIN CHLORIDE ER 10 MG PO TB24
10.0000 mg | ORAL_TABLET | Freq: Every day | ORAL | Status: DC
  Administered 2022-10-24 – 2022-10-27 (×4): 10 mg via ORAL
  Filled 2022-10-23 (×4): qty 1

## 2022-10-23 MED ORDER — POTASSIUM CHLORIDE IN NACL 20-0.9 MEQ/L-% IV SOLN
INTRAVENOUS | Status: AC
  Filled 2022-10-23 (×2): qty 1000

## 2022-10-23 MED ORDER — POTASSIUM CHLORIDE CRYS ER 20 MEQ PO TBCR
20.0000 meq | EXTENDED_RELEASE_TABLET | Freq: Once | ORAL | Status: AC
  Administered 2022-10-23: 20 meq via ORAL
  Filled 2022-10-23: qty 1

## 2022-10-23 MED ORDER — LEVOTHYROXINE SODIUM 100 MCG PO TABS
100.0000 ug | ORAL_TABLET | Freq: Every day | ORAL | Status: DC
  Administered 2022-10-24 – 2022-10-27 (×4): 100 ug via ORAL
  Filled 2022-10-23 (×4): qty 1

## 2022-10-23 MED ORDER — METHOCARBAMOL 1000 MG/10ML IJ SOLN: 500.0000 mg | Freq: Four times a day (QID) | INTRAVENOUS | Status: DC | PRN

## 2022-10-23 NOTE — H&P (Signed)
History and Physical    Teresa Patterson ZOX:[id number] DOB: 1940/10/23 DOA: 10/23/2022  PCP: Natalia Leatherwood, DO   Patient coming from: Home   Chief Complaint: Right hip pain   HPI: Teresa Patterson is a pleasant 82 y.o. female with medical history significant for hypothyroidism and migraines who presents emergency department with severe right hip pain and inability to bear weight after a ground-level fall.  Patient was in her usual state today, walked a mile without incident, and had just finished power washing her porch when she was turning off the water and tripped over a hose, landing on her right hip.  She denies hitting her head or losing consciousness but has been experiencing severe right hip pain and inability to bear weight.  She also hit her right elbow on the ground but denies any pain aside from the right hip.  She has not had any recent illness, denies any history of heart disease, is typically very active, and could ascend 2 flights of stairs without angina.  ED Course: Upon arrival to the ED, patient is found to be afebrile and saturating well on room air with stable blood pressure.  Plain radiographs demonstrate subcapital right femoral neck fracture.  Labs notable for WBC 13,000 and potassium 3.4.  Orthopedic surgery (Dr. Yevette Edwards) was consulted by the ED physician, type and screen was performed, and the patient was treated with IV fluids and fentanyl.  Review of Systems:  All other systems reviewed and apart from HPI, are negative.  Past Medical History:  Diagnosis Date   Colon polyp    Hypothyroid    hypothyroid   Migraine    Multiple dysplastic nevi    established with Dr. Karlyn Agee dermatology   Osteopenia     Past Surgical History:  Procedure Laterality Date   ABDOMINAL HYSTERECTOMY  1972   CHOLECYSTECTOMY  1995   OVARIAN CYST REMOVAL Right    right   TONSILLECTOMY AND ADENOIDECTOMY  1952    Social History:   reports that she has never smoked. She has  never used smokeless tobacco. She reports that she does not drink alcohol and does not use drugs.  Allergies  Allergen Reactions   Fosamax [Alendronate]     Family History  Problem Relation Age of Onset   Brain cancer Brother        BRAIN TUMOR   Cancer Maternal Uncle        ?     Prior to Admission medications   Medication Sig Start Date End Date Taking? Authorizing Provider  Coenzyme Q10 (CO Q 10) 100 MG CAPS Take 1 capsule by mouth daily.    [provider]  conjugated estrogens (PREMARIN) vaginal cream 1 applicator of cream  intravaginally 1-2 weekly qhs 09/07/22   Kuneff, Renee A, DO  fluticasone (FLONASE) 50 MCG/ACT nasal spray Place 2 sprays into both nostrils daily. 09/07/22   Kuneff, Renee A, DO  hydrochlorothiazide (HYDRODIURIL) 25 MG tablet Take 1 tablet (25 mg total) by mouth daily. 09/07/22   Kuneff, Renee A, DO  levothyroxine (SYNTHROID) 100 MCG tablet Take 1 tablet (100 mcg total) by mouth daily. 09/08/22   Kuneff, Renee A, DO  Multiple Vitamin (MULTIVITAMIN) tablet Take 1 tablet by mouth daily.    [provider]  oxybutynin (DITROPAN-XL) 10 MG 24 hr tablet Take 10 mg by mouth daily.    [provider]  SUMAtriptan (IMITREX) 5 MG/ACT nasal spray Place 1 spray (5 mg total) into the nose  every 2 (two) hours as needed. 09/07/22   Natalia Leatherwood, DO    Physical Exam: Vitals:   10/23/22 2200 10/23/22 2215 10/23/22 2230 10/23/22 2240  BP: 123/64 132/73 126/74   Pulse: 64 (!) 56 68   Resp: 18 11 19    Temp:    98.5 F (36.9 C)  TempSrc:    Oral  SpO2: 96% 99% 96%   Weight:      Height:        Constitutional: NAD, calm  Eyes: PERTLA, lids and conjunctivae normal ENMT: Mucous membranes are moist. Posterior pharynx clear of any exudate or lesions.   Neck: supple, no masses  Respiratory: no wheezing, no crackles. No accessory muscle use.  Cardiovascular: S1 & S2 heard, regular rate and rhythm. No extremity edema.   Abdomen: No distension, no  tenderness, soft. Bowel sounds active.  Musculoskeletal: no clubbing / cyanosis. Right hip tender, neurovascularly intact distally.   Skin: no significant rashes, lesions, ulcers. Warm, dry, well-perfused. Neurologic: CN 2-12 grossly intact. Moving all extremities. Alert and oriented.  Psychiatric: Pleasant. Cooperative.    Labs and Imaging on Admission: I have personally reviewed following labs and imaging studies  CBC: Recent Labs  Lab 10/23/22 2109  WBC 13.0*  HGB 12.7  HCT 38.1  MCV 89.0  PLT 155   Basic Metabolic Panel: Recent Labs  Lab 10/23/22 2109  NA 138  K 3.4*  CL 103  CO2 25  GLUCOSE 109*  BUN 11  CREATININE 0.81  CALCIUM 9.7   GFR: Estimated Creatinine Clearance: 44.3 mL/min (by C-G formula based on SCr of 0.81 mg/dL). Liver Function Tests: Recent Labs  Lab 10/23/22 2109  AST 18  ALT 12  ALKPHOS 44  BILITOT 0.7  PROT 6.1*  ALBUMIN 3.7   No results for input(s): "LIPASE", "AMYLASE" in the last 168 hours. No results for input(s): "AMMONIA" in the last 168 hours. Coagulation Profile: Recent Labs  Lab 10/23/22 2109  INR 1.2   Cardiac Enzymes: No results for input(s): "CKTOTAL", "CKMB", "CKMBINDEX", "TROPONINI" in the last 168 hours. BNP (last 3 results) No results for input(s): "PROBNP" in the last 8760 hours. HbA1C: No results for input(s): "HGBA1C" in the last 72 hours. CBG: No results for input(s): "GLUCAP" in the last 168 hours. Lipid Profile: No results for input(s): "CHOL", "HDL", "LDLCALC", "TRIG", "CHOLHDL", "LDLDIRECT" in the last 72 hours. Thyroid Function Tests: No results for input(s): "TSH", "T4TOTAL", "FREET4", "T3FREE", "THYROIDAB" in the last 72 hours. Anemia Panel: No results for input(s): "VITAMINB12", "FOLATE", "FERRITIN", "TIBC", "IRON", "RETICCTPCT" in the last 72 hours. Urine analysis:    Component Value Date/Time   COLORURINE YELLOW 07/29/2019 0935   APPEARANCEUR CLEAR 07/29/2019 0935   LABSPEC 1.010 07/29/2019  0935   PHURINE 7.0 07/29/2019 0935   GLUCOSEU NEGATIVE 07/29/2019 0935   HGBUR NEGATIVE 07/29/2019 0935   HGBUR negative 06/16/2009 1003   BILIRUBINUR n 07/18/2017 1259   KETONESUR NEGATIVE 07/29/2019 0935   PROTEINUR NEGATIVE 07/29/2019 0935   UROBILINOGEN 0.2 07/18/2017 1259   UROBILINOGEN 0.2 06/16/2009 1003   NITRITE n 07/18/2017 1259   NITRITE negative 06/16/2009 1003   LEUKOCYTESUR Negative 07/18/2017 1259   Sepsis Labs: @LABRCNTIP (procalcitonin:4,lacticidven:4) )No results found for this or any previous visit (from the past 240 hour(s)).   Radiological Exams on Admission: DG Chest Port 1 View  Result Date: 10/23/2022 CLINICAL DATA:  Preoperative evaluation. EXAM: PORTABLE CHEST 1 VIEW COMPARISON:  None Available. FINDINGS: The heart size and mediastinal contours are within normal  limits. There is no evidence of an acute infiltrate, pleural effusion or pneumothorax. The visualized skeletal structures are unremarkable. IMPRESSION: No active disease. Electronically Signed   By: Aram Candela M.D.   On: 10/23/2022 23:16   DG Hip Unilat W or Wo Pelvis 2-3 Views Right  Result Date: 10/23/2022 CLINICAL DATA:  Recent fall with right hip pain, initial encounter EXAM: DG HIP (WITH OR WITHOUT PELVIS) 3V RIGHT COMPARISON:  None Available. FINDINGS: Pelvic ring is intact. There is a right subcapital femoral neck fracture with impaction and angulation at the fracture site. Femoral head is well seated. No soft tissue abnormality is noted. IMPRESSION: Subcapital right femoral neck fracture. Electronically Signed   By: Alcide Clever M.D.   On: 10/23/2022 19:23    EKG: Independently reviewed. Sinus rhythm.   Assessment/Plan   1. Right hip fracture  - Based on the available data, Mrs. Crile presents an estimated 0.2% risk of perioperative MI or cardiac arrest; no further preoperative cardiac testing is indicated  - Continue pain-control, supportive care, NPO after midnight    2.  Hypothyroidism  - Continue Synthroid    3. Hypokalemia  - Replacing potassium, will repeat chem panel in am    DVT prophylaxis: SCDs  Code Status: Full  Level of Care: Level of care: Med-Surg Family Communication: Daughter at bedside   Disposition Plan:  Patient is from: home  Anticipated d/c is to: TBD Anticipated d/c date is: 10/27/22  Patient currently: Pending ortho consult and likely operative hip repair  Consults called: Orthopedic surgery  Admission status: Inpatient     Briscoe Deutscher, MD [facility]  10/23/2022, 11:31 PM

## 2022-10-23 NOTE — ED Triage Notes (Signed)
PT arrives POV with a c/o of right hip pain after falling around 1530 after tripping over a water hose.PT states that she can not stand on her leg anymore.

## 2022-10-23 NOTE — Telephone Encounter (Signed)
  Chief Complaint: fell right hip and unable to bear weight  Symptoms: tripped over waterhose in yard and fell on concrete hitting right hip and right elbow. Scraped right elbow denies bleeding  Frequency: 1 hour and 1/2 ago  Pertinent Negatives: Patient denies dizziness  Disposition: [x] ED /[] Urgent Care (no appt availability in office) / [] Appointment(In office/virtual)/ []  [facility]/ [] Home Care/ [] Refused Recommended Disposition /[] [facility]/ []  Follow-up with PCP Additional Notes:   Recommended to call 911 due to unable to bear weight and daughter reports she will take patient to ED . Reinforced if unable to get into car call 911. Daughter requesting to know if [facility] could treat her. NT reports she can be treated but if needs to be admitted should go to [facility] ED on [address] in [place].      Reason for Disposition  [1] SEVERE weakness (i.e., unable to walk or barely able to walk, requires support) AND [2] new-onset or worsening  Answer Assessment - Initial Assessment Questions 1. MECHANISM: "How did the fall happen?"     Outside  and tripped over water hose 2. DOMESTIC VIOLENCE AND ELDER ABUSE SCREENING: "Did you fall because someone pushed you or tried to hurt you?" If Yes, ask: "Are you safe now?"     na 3. ONSET: "When did the fall happen?" (e.g., minutes, hours, or days ago)     1 hour and 1/2 ago  4. LOCATION: "What part of the body hit the ground?" (e.g., back, buttocks, head, hips, knees, hands, head, stomach)     Right hip right elbow.  5. INJURY: "Did you hurt (injure) yourself when you fell?" If Yes, ask: "What did you injure? Tell me more about this?" (e.g., body area; type of injury; pain severity)"     Yes right hip  pain and unable to put weight on it  6. PAIN: "Is there any pain?" If Yes, ask: "How bad is the pain?" (e.g., Scale 1-10; or mild,  moderate, severe)   - NONE (0): No pain   - MILD (1-3): Doesn't interfere with  normal activities    - MODERATE (4-7): Interferes with normal activities or awakens from sleep    - SEVERE (8-10): Excruciating pain, unable to do any normal activities      Not painful but can not bear weight on hip 7. SIZE: For cuts, bruises, or swelling, ask: "How large is it?" (e.g., inches or centimeters)      Right elbow scrape 8. PREGNANCY: "Is there any chance you are pregnant?" "When was your last menstrual period?"     Na  9. OTHER SYMPTOMS: "Do you have any other symptoms?" (e.g., dizziness, fever, weakness; new onset or worsening).      No  10. CAUSE: "What do you think caused the fall (or falling)?" (e.g., tripped, dizzy spell)       Tripped over water hose  Protocols used: Falls and [facility]

## 2022-10-23 NOTE — ED Provider Notes (Signed)
[place] EMERGENCY DEPARTMENT AT [facility] Provider Note   CSN: [id number] Arrival date & time: 10/23/22  1755     History  Chief Complaint  Patient presents with   Hip Pain    Teresa Patterson is a 82 y.o. female.  HPI    82 year old female comes in with chief complaint of fall.  Patient has no significant past medical history.  She states that she was washing her porch, and she tripped over a water hose.  She fell onto her right hip.  She has been having significant discomfort since.  Patient denies any head trauma, neck pain, one-sided weakness or numbness.   Patient is not on any blood thinners.  Home Medications Prior to Admission medications   Medication Sig Start Date End Date Taking? Authorizing Provider  Coenzyme Q10 (CO Q 10) 100 MG CAPS Take 1 capsule by mouth daily.    [provider]  conjugated estrogens (PREMARIN) vaginal cream 1 applicator of cream  intravaginally 1-2 weekly qhs 09/07/22   Kuneff, Renee A, DO  fluticasone (FLONASE) 50 MCG/ACT nasal spray Place 2 sprays into both nostrils daily. 09/07/22   Kuneff, Renee A, DO  hydrochlorothiazide (HYDRODIURIL) 25 MG tablet Take 1 tablet (25 mg total) by mouth daily. 09/07/22   Kuneff, Renee A, DO  levothyroxine (SYNTHROID) 100 MCG tablet Take 1 tablet (100 mcg total) by mouth daily. 09/08/22   Kuneff, Renee A, DO  Multiple Vitamin (MULTIVITAMIN) tablet Take 1 tablet by mouth daily.    [provider]  oxybutynin (DITROPAN-XL) 10 MG 24 hr tablet Take 10 mg by mouth daily.    [provider]  SUMAtriptan (IMITREX) 5 MG/ACT nasal spray Place 1 spray (5 mg total) into the nose every 2 (two) hours as needed. 09/07/22   Kuneff, Renee A, DO      Allergies    Fosamax [alendronate]    Review of Systems   Review of Systems  Physical Exam Updated Vital Signs BP 126/74   Pulse 68   Temp 98.5 F (36.9 C) (Oral)   Resp 19   Ht 5\' 3"  (1.6 m)   Wt 59.1 kg   SpO2 96%   BMI 23.08  kg/m  Physical Exam Vitals and nursing note reviewed.  Constitutional:      Appearance: She is well-developed.  HENT:     Head: Normocephalic and atraumatic.  Eyes:     Extraocular Movements: Extraocular movements intact.  Neck:     Comments: No midline c-spine tenderness, pt able to turn head to 45 degrees bilaterally without any pain and able to flex neck to the chest and extend without any pain or neurologic symptoms.  Cardiovascular:     Rate and Rhythm: Normal rate.  Pulmonary:     Effort: Pulmonary effort is normal.  Musculoskeletal:        General: Tenderness and deformity present.     Cervical back: Normal range of motion and neck supple.     Comments: Right hip tenderness, deformity  Skin:    General: Skin is dry.  Neurological:     Mental Status: She is alert and oriented to person, place, and time.     ED Results / Procedures / Treatments   Labs (all labs ordered are listed, but only abnormal results are displayed) Labs Reviewed  CBC - Abnormal; Notable for the following components:      Result Value   WBC 13.0 (*)    All other components within  normal limits  COMPREHENSIVE METABOLIC PANEL - Abnormal; Notable for the following components:   Potassium 3.4 (*)    Glucose, Bld 109 (*)    Total Protein 6.1 (*)    All other components within normal limits  PROTIME-INR  TYPE AND SCREEN  ABO/RH    EKG None  Radiology DG Hip Unilat W or Wo Pelvis 2-3 Views Right  Result Date: 10/23/2022 CLINICAL DATA:  Recent fall with right hip pain, initial encounter EXAM: DG HIP (WITH OR WITHOUT PELVIS) 3V RIGHT COMPARISON:  None Available. FINDINGS: Pelvic ring is intact. There is a right subcapital femoral neck fracture with impaction and angulation at the fracture site. Femoral head is well seated. No soft tissue abnormality is noted. IMPRESSION: Subcapital right femoral neck fracture. Electronically Signed   By: Alcide Clever M.D.   On: 10/23/2022 19:23     Procedures Procedures    Medications Ordered in ED Medications  fentaNYL (SUBLIMAZE) injection 50 mcg (50 mcg Intravenous Given 10/23/22 2237)    ED Course/ Medical Decision Making/ A&P                             Medical Decision Making Amount and/or Complexity of Data Reviewed Labs: ordered. Radiology: ordered.  Risk Prescription drug management. Decision regarding hospitalization.   82 year old patient otherwise healthy comes in with chief complaint of mechanical fall. Patient is neurovascularly intact. History not indicative of direct head trauma, patient is not on blood thinners.  She is more than 6 hours post injury and has no headache, neck pain, I feel comfortable clearing brain and C-spine clinically.  Differential diagnosis for this patient essentially includes hip fracture/femoral fracture, hip dislocation.  X-ray of the hip ordered, it was independently interpreted.  There is evidence of femoral neck fracture.  Orthopedic surgery consulted.  Patient will need admission to the hospital.   Final Clinical Impression(s) / ED Diagnoses Final diagnoses:  Closed fracture of neck of right femur, initial encounter [facility])    Rx / DC Orders ED Discharge Orders     None         Derwood Kaplan, MD 10/23/22 2248

## 2022-10-24 ENCOUNTER — Inpatient Hospital Stay (HOSPITAL_COMMUNITY): Payer: Medicare HMO | Admitting: Anesthesiology

## 2022-10-24 ENCOUNTER — Inpatient Hospital Stay (HOSPITAL_COMMUNITY): Payer: Medicare HMO

## 2022-10-24 ENCOUNTER — Encounter (HOSPITAL_COMMUNITY): Payer: Self-pay | Admitting: Family Medicine

## 2022-10-24 ENCOUNTER — Encounter (HOSPITAL_COMMUNITY)
Admission: EM | Disposition: A | Discharge status: Home Health | Payer: Self-pay | Source: Home / Self Care | Attending: Student

## 2022-10-24 DIAGNOSIS — S72041A Displaced fracture of base of neck of right femur, initial encounter for closed fracture: Secondary | ICD-10-CM | POA: Diagnosis not present

## 2022-10-24 DIAGNOSIS — I10 Essential (primary) hypertension: Secondary | ICD-10-CM

## 2022-10-24 DIAGNOSIS — E876 Hypokalemia: Secondary | ICD-10-CM | POA: Diagnosis not present

## 2022-10-24 DIAGNOSIS — S72011A Unspecified intracapsular fracture of right femur, initial encounter for closed fracture: Secondary | ICD-10-CM

## 2022-10-24 DIAGNOSIS — S72001A Fracture of unspecified part of neck of right femur, initial encounter for closed fracture: Secondary | ICD-10-CM | POA: Diagnosis not present

## 2022-10-24 DIAGNOSIS — E034 Atrophy of thyroid (acquired): Secondary | ICD-10-CM | POA: Diagnosis not present

## 2022-10-24 DIAGNOSIS — E039 Hypothyroidism, unspecified: Secondary | ICD-10-CM

## 2022-10-24 HISTORY — PX: TOTAL HIP ARTHROPLASTY: SHX124

## 2022-10-24 LAB — SURGICAL PCR SCREEN
MRSA, PCR: NEGATIVE
Staphylococcus aureus: POSITIVE — AB

## 2022-10-24 LAB — BASIC METABOLIC PANEL
Anion gap: 9 (ref 5–15)
BUN: 13 mg/dL (ref 8–23)
CO2: 26 mmol/L (ref 22–32)
Calcium: 9.3 mg/dL (ref 8.9–10.3)
Chloride: 102 mmol/L (ref 98–111)
Creatinine, Ser: 0.81 mg/dL (ref 0.44–1.00)
GFR, Estimated: >60 mL/min (ref >60)
Glucose, Bld: 121 mg/dL — ABNORMAL HIGH (ref 70–99)
Potassium: 3.5 mmol/L (ref 3.5–5.1)
Sodium: 137 mmol/L (ref 135–145)

## 2022-10-24 LAB — CBC
HCT: 37.5 % (ref 36.0–46.0)
Hemoglobin: 12.3 g/dL (ref 12.0–15.0)
MCH: 28.9 pg (ref 26.0–34.0)
MCHC: 32.8 g/dL (ref 30.0–36.0)
MCV: 88 fL (ref 80.0–100.0)
Platelets: 132 10*3/uL — ABNORMAL LOW (ref 150–400)
RBC: 4.26 MIL/uL (ref 3.87–5.11)
RDW: 13.9 % (ref 11.5–15.5)
WBC: 10.1 10*3/uL (ref 4.0–10.5)
nRBC: 0 % (ref 0.0–0.2)

## 2022-10-24 LAB — MAGNESIUM: Magnesium: 2 mg/dL (ref 1.7–2.4)

## 2022-10-24 SURGERY — ARTHROPLASTY, HIP, TOTAL, ANTERIOR APPROACH
Anesthesia: Spinal | Site: Hip | Laterality: Right

## 2022-10-24 MED ORDER — PHENYLEPHRINE HCL-NACL 20-0.9 MG/250ML-% IV SOLN
INTRAVENOUS | Status: DC | PRN
  Administered 2022-10-24: 30 ug/min via INTRAVENOUS

## 2022-10-24 MED ORDER — ROCURONIUM BROMIDE 10 MG/ML (PF) SYRINGE
PREFILLED_SYRINGE | INTRAVENOUS | Status: AC
  Filled 2022-10-24: qty 10

## 2022-10-24 MED ORDER — DIPHENHYDRAMINE HCL 12.5 MG/5ML PO ELIX: 12.5000 mg | ORAL_SOLUTION | ORAL | Status: DC | PRN

## 2022-10-24 MED ORDER — ORAL CARE MOUTH RINSE: 15.0000 mL | Freq: Once | OROMUCOSAL | Status: DC

## 2022-10-24 MED ORDER — OXYCODONE HCL 5 MG PO TABS
10.0000 mg | ORAL_TABLET | ORAL | Status: DC | PRN
  Administered 2022-10-25 – 2022-10-27 (×3): 15 mg via ORAL
  Filled 2022-10-24 (×3): qty 3

## 2022-10-24 MED ORDER — ACETAMINOPHEN 10 MG/ML IV SOLN
INTRAVENOUS | Status: AC
  Filled 2022-10-24: qty 100

## 2022-10-24 MED ORDER — PHENOL 1.4 % MT LIQD: 1.0000 | OROMUCOSAL | Status: DC | PRN

## 2022-10-24 MED ORDER — CHLORHEXIDINE GLUCONATE 0.12 % MT SOLN
OROMUCOSAL | Status: AC
  Filled 2022-10-24: qty 15

## 2022-10-24 MED ORDER — ALUM & MAG HYDROXIDE-SIMETH 200-200-20 MG/5ML PO SUSP: 30.0000 mL | ORAL | Status: DC | PRN

## 2022-10-24 MED ORDER — FENTANYL CITRATE (PF) 250 MCG/5ML IJ SOLN
INTRAMUSCULAR | Status: AC
  Filled 2022-10-24: qty 5

## 2022-10-24 MED ORDER — LIDOCAINE-EPINEPHRINE (PF) 1.5 %-1:200000 IJ SOLN
INTRAMUSCULAR | Status: DC | PRN
  Administered 2022-10-24: 5 mL via PERINEURAL

## 2022-10-24 MED ORDER — METOCLOPRAMIDE HCL 5 MG/ML IJ SOLN: 5.0000 mg | Freq: Three times a day (TID) | INTRAMUSCULAR | Status: DC | PRN

## 2022-10-24 MED ORDER — HYDROCHLOROTHIAZIDE 25 MG PO TABS
25.0000 mg | ORAL_TABLET | Freq: Every day | ORAL | Status: DC
  Administered 2022-10-24 – 2022-10-25 (×2): 25 mg via ORAL
  Filled 2022-10-24 (×2): qty 1

## 2022-10-24 MED ORDER — SODIUM CHLORIDE 0.9 % IR SOLN
Status: DC | PRN
  Administered 2022-10-24: 1000 mL

## 2022-10-24 MED ORDER — CEFAZOLIN SODIUM-DEXTROSE 2-4 GM/100ML-% IV SOLN
2.0000 g | INTRAVENOUS | Status: AC
  Administered 2022-10-24: 2 g via INTRAVENOUS
  Filled 2022-10-24: qty 100

## 2022-10-24 MED ORDER — PROPOFOL 10 MG/ML IV BOLUS
INTRAVENOUS | Status: DC | PRN
  Administered 2022-10-24: 30 mg via INTRAVENOUS

## 2022-10-24 MED ORDER — CHLORHEXIDINE GLUCONATE 0.12 % MT SOLN: 15.0000 mL | Freq: Once | OROMUCOSAL | Status: DC

## 2022-10-24 MED ORDER — ONDANSETRON HCL 4 MG/2ML IJ SOLN
INTRAMUSCULAR | Status: AC
  Administered 2022-10-24: 4 mg via INTRAVENOUS
  Filled 2022-10-24: qty 2

## 2022-10-24 MED ORDER — PROPOFOL 500 MG/50ML IV EMUL
INTRAVENOUS | Status: DC | PRN
  Administered 2022-10-24: 50 ug/kg/min via INTRAVENOUS

## 2022-10-24 MED ORDER — MIDAZOLAM HCL 2 MG/2ML IJ SOLN: 0.5000 mg | Freq: Once | INTRAMUSCULAR | Status: DC | PRN

## 2022-10-24 MED ORDER — POVIDONE-IODINE 10 % EX SWAB
2.0000 | Freq: Once | CUTANEOUS | Status: AC
  Administered 2022-10-24: 2 via TOPICAL

## 2022-10-24 MED ORDER — METOCLOPRAMIDE HCL 5 MG PO TABS: 5.0000 mg | ORAL_TABLET | Freq: Three times a day (TID) | ORAL | Status: DC | PRN

## 2022-10-24 MED ORDER — ONDANSETRON HCL 4 MG PO TABS: 4.0000 mg | ORAL_TABLET | Freq: Four times a day (QID) | ORAL | Status: DC | PRN

## 2022-10-24 MED ORDER — SODIUM CHLORIDE 0.9 % IV SOLN: INTRAVENOUS | Status: DC

## 2022-10-24 MED ORDER — TRANEXAMIC ACID-NACL 1000-0.7 MG/100ML-% IV SOLN
1000.0000 mg | INTRAVENOUS | Status: AC
  Administered 2022-10-24: 1000 mg via INTRAVENOUS
  Filled 2022-10-24: qty 100

## 2022-10-24 MED ORDER — OXYCODONE HCL 5 MG PO TABS
5.0000 mg | ORAL_TABLET | ORAL | Status: DC | PRN
  Administered 2022-10-25: 10 mg via ORAL
  Filled 2022-10-24: qty 2

## 2022-10-24 MED ORDER — METHOCARBAMOL 500 MG PO TABS: 500.0000 mg | ORAL_TABLET | Freq: Four times a day (QID) | ORAL | Status: DC | PRN

## 2022-10-24 MED ORDER — BUPIVACAINE IN DEXTROSE 0.75-8.25 % IT SOLN
INTRATHECAL | Status: DC | PRN
  Administered 2022-10-24: 12 mg via INTRATHECAL

## 2022-10-24 MED ORDER — PROPOFOL 10 MG/ML IV BOLUS
INTRAVENOUS | Status: AC
  Filled 2022-10-24: qty 20

## 2022-10-24 MED ORDER — OXYCODONE HCL 5 MG PO TABS: 5.0000 mg | ORAL_TABLET | Freq: Once | ORAL | Status: DC | PRN

## 2022-10-24 MED ORDER — CEFAZOLIN SODIUM-DEXTROSE 1-4 GM/50ML-% IV SOLN
1.0000 g | Freq: Four times a day (QID) | INTRAVENOUS | Status: AC
  Administered 2022-10-24 – 2022-10-25 (×2): 1 g via INTRAVENOUS
  Filled 2022-10-24 (×2): qty 50

## 2022-10-24 MED ORDER — ADULT MULTIVITAMIN W/MINERALS CH
1.0000 | ORAL_TABLET | Freq: Every day | ORAL | Status: DC
  Administered 2022-10-25 – 2022-10-27 (×3): 1 via ORAL
  Filled 2022-10-24 (×3): qty 1

## 2022-10-24 MED ORDER — ACETAMINOPHEN 10 MG/ML IV SOLN
1000.0000 mg | Freq: Once | INTRAVENOUS | Status: AC
  Administered 2022-10-24: 1000 mg via INTRAVENOUS

## 2022-10-24 MED ORDER — CHLORHEXIDINE GLUCONATE CLOTH 2 % EX PADS
6.0000 | MEDICATED_PAD | Freq: Every day | CUTANEOUS | Status: DC
  Administered 2022-10-25 – 2022-10-26 (×2): 6 via TOPICAL

## 2022-10-24 MED ORDER — MUPIROCIN 2 % EX OINT
1.0000 | TOPICAL_OINTMENT | Freq: Two times a day (BID) | CUTANEOUS | Status: DC
  Administered 2022-10-25 – 2022-10-27 (×6): 1 via NASAL
  Filled 2022-10-24 (×3): qty 22

## 2022-10-24 MED ORDER — DEXAMETHASONE SODIUM PHOSPHATE 10 MG/ML IJ SOLN
INTRAMUSCULAR | Status: AC
  Filled 2022-10-24: qty 1

## 2022-10-24 MED ORDER — METHOCARBAMOL 1000 MG/10ML IJ SOLN
500.0000 mg | Freq: Four times a day (QID) | INTRAVENOUS | Status: DC | PRN
  Filled 2022-10-24: qty 5

## 2022-10-24 MED ORDER — LACTATED RINGERS IV SOLN: INTRAVENOUS | Status: DC

## 2022-10-24 MED ORDER — ASPIRIN 81 MG PO CHEW
81.0000 mg | CHEWABLE_TABLET | Freq: Two times a day (BID) | ORAL | Status: DC
  Administered 2022-10-24 – 2022-10-27 (×6): 81 mg via ORAL
  Filled 2022-10-24 (×6): qty 1

## 2022-10-24 MED ORDER — HYDROMORPHONE HCL 1 MG/ML IJ SOLN
0.5000 mg | INTRAMUSCULAR | Status: DC | PRN
  Administered 2022-10-25 (×2): 1 mg via INTRAVENOUS
  Filled 2022-10-24 (×2): qty 1

## 2022-10-24 MED ORDER — HYDROMORPHONE HCL 1 MG/ML IJ SOLN
0.5000 mg | INTRAMUSCULAR | Status: DC | PRN
  Administered 2022-10-24 (×2): 0.5 mg via INTRAVENOUS
  Filled 2022-10-24: qty 1
  Filled 2022-10-24: qty 0.5

## 2022-10-24 MED ORDER — FENTANYL CITRATE (PF) 100 MCG/2ML IJ SOLN
INTRAMUSCULAR | Status: AC
  Administered 2022-10-24: 25 ug
  Filled 2022-10-24: qty 2

## 2022-10-24 MED ORDER — HYDROMORPHONE HCL 1 MG/ML IJ SOLN: 0.2500 mg | INTRAMUSCULAR | Status: DC | PRN

## 2022-10-24 MED ORDER — FENTANYL CITRATE PF 50 MCG/ML IJ SOSY: 25.0000 ug | PREFILLED_SYRINGE | Freq: Once | INTRAMUSCULAR | Status: DC

## 2022-10-24 MED ORDER — ONDANSETRON HCL 4 MG/2ML IJ SOLN
4.0000 mg | Freq: Once | INTRAMUSCULAR | Status: AC
  Administered 2022-10-24: 4 mg via INTRAVENOUS

## 2022-10-24 MED ORDER — 0.9 % SODIUM CHLORIDE (POUR BTL) OPTIME
TOPICAL | Status: DC | PRN
  Administered 2022-10-24: 1000 mL

## 2022-10-24 MED ORDER — ONDANSETRON HCL 4 MG/2ML IJ SOLN
INTRAMUSCULAR | Status: AC
  Filled 2022-10-24: qty 2

## 2022-10-24 MED ORDER — VASOPRESSIN 20 UNIT/ML IV SOLN
INTRAVENOUS | Status: AC
  Filled 2022-10-24: qty 1

## 2022-10-24 MED ORDER — PROMETHAZINE HCL 25 MG/ML IJ SOLN: 6.2500 mg | INTRAMUSCULAR | Status: DC | PRN

## 2022-10-24 MED ORDER — CHLORHEXIDINE GLUCONATE 4 % EX SOLN: 60.0000 mL | Freq: Once | CUTANEOUS | Status: DC

## 2022-10-24 MED ORDER — MENTHOL 3 MG MT LOZG: 1.0000 | LOZENGE | OROMUCOSAL | Status: DC | PRN

## 2022-10-24 MED ORDER — OXYCODONE HCL 5 MG/5ML PO SOLN: 5.0000 mg | Freq: Once | ORAL | Status: DC | PRN

## 2022-10-24 MED ORDER — PANTOPRAZOLE SODIUM 40 MG PO TBEC
40.0000 mg | DELAYED_RELEASE_TABLET | Freq: Every day | ORAL | Status: DC
  Administered 2022-10-24 – 2022-10-26 (×3): 40 mg via ORAL
  Filled 2022-10-24 (×4): qty 1

## 2022-10-24 MED ORDER — BUPIVACAINE-EPINEPHRINE (PF) 0.5% -1:200000 IJ SOLN
INTRAMUSCULAR | Status: DC | PRN
  Administered 2022-10-24: 25 mL via PERINEURAL

## 2022-10-24 MED ORDER — ONDANSETRON HCL 4 MG/2ML IJ SOLN
4.0000 mg | Freq: Four times a day (QID) | INTRAMUSCULAR | Status: DC | PRN
  Administered 2022-10-25: 4 mg via INTRAVENOUS
  Filled 2022-10-24 (×2): qty 2

## 2022-10-24 MED ORDER — ACETAMINOPHEN 325 MG PO TABS
325.0000 mg | ORAL_TABLET | Freq: Four times a day (QID) | ORAL | Status: DC | PRN
  Administered 2022-10-25: 650 mg via ORAL
  Filled 2022-10-24: qty 2

## 2022-10-24 MED ORDER — DOCUSATE SODIUM 100 MG PO CAPS
100.0000 mg | ORAL_CAPSULE | Freq: Two times a day (BID) | ORAL | Status: DC
  Administered 2022-10-24 – 2022-10-25 (×3): 100 mg via ORAL
  Filled 2022-10-24 (×3): qty 1

## 2022-10-24 SURGICAL SUPPLY — 56 items
APL SKNCLS STERI-STRIP NONHPOA (GAUZE/BANDAGES/DRESSINGS)
BAG COUNTER SPONGE SURGICOUNT (BAG) ×1 IMPLANT
BAG SPNG CNTER NS LX DISP (BAG) ×1
BENZOIN TINCTURE PRP APPL 2/3 (GAUZE/BANDAGES/DRESSINGS) ×1 IMPLANT
BLADE CLIPPER SURG (BLADE) IMPLANT
BLADE SAW SGTL 18X1.27X75 (BLADE) ×1 IMPLANT
COVER SURGICAL LIGHT HANDLE (MISCELLANEOUS) ×1 IMPLANT
CUP SECTOR GRIPTON 50MM (Cup) IMPLANT
DRAPE C-ARM 42X72 X-RAY (DRAPES) ×1 IMPLANT
DRAPE STERI IOBAN 125X83 (DRAPES) ×1 IMPLANT
DRAPE U-SHAPE 47X51 STRL (DRAPES) ×3 IMPLANT
DRSG AQUACEL AG ADV 3.5X10 (GAUZE/BANDAGES/DRESSINGS) ×1 IMPLANT
DURAPREP 26ML APPLICATOR (WOUND CARE) ×1 IMPLANT
ELECT BLADE 4.0 EZ CLEAN MEGAD (MISCELLANEOUS) ×1
ELECT BLADE 6.5 EXT (BLADE) IMPLANT
ELECT REM PT RETURN 9FT ADLT (ELECTROSURGICAL) ×1
ELECTRODE BLDE 4.0 EZ CLN MEGD (MISCELLANEOUS) ×1 IMPLANT
ELECTRODE REM PT RTRN 9FT ADLT (ELECTROSURGICAL) ×1 IMPLANT
FACESHIELD WRAPAROUND (MASK) ×2 IMPLANT
FACESHIELD WRAPAROUND OR TEAM (MASK) ×2 IMPLANT
FEM STEM 12/14 TAPER SZ 4 HIP (Orthopedic Implant) ×1 IMPLANT
FEMORAL STEM 12/14 TPR SZ4 HIP (Orthopedic Implant) IMPLANT
GLOVE BIOGEL PI IND STRL 8 (GLOVE) ×2 IMPLANT
GLOVE ECLIPSE 8.0 STRL XLNG CF (GLOVE) ×1 IMPLANT
GLOVE ORTHO TXT STRL SZ7.5 (GLOVE) ×2 IMPLANT
GOWN STRL REUS W/ TWL LRG LVL3 (GOWN DISPOSABLE) ×2 IMPLANT
GOWN STRL REUS W/ TWL XL LVL3 (GOWN DISPOSABLE) ×2 IMPLANT
GOWN STRL REUS W/TWL LRG LVL3 (GOWN DISPOSABLE) ×2
GOWN STRL REUS W/TWL XL LVL3 (GOWN DISPOSABLE) ×2
HANDPIECE INTERPULSE COAX TIP (DISPOSABLE) ×1
HEAD FEM STD 32X+5 STRL (Hips) IMPLANT
KIT BASIN OR (CUSTOM PROCEDURE TRAY) ×1 IMPLANT
KIT TURNOVER KIT B (KITS) ×1 IMPLANT
LINER ACETABULAR 32X50 (Liner) IMPLANT
MANIFOLD NEPTUNE II (INSTRUMENTS) ×1 IMPLANT
NS IRRIG 1000ML POUR BTL (IV SOLUTION) ×1 IMPLANT
PACK TOTAL JOINT (CUSTOM PROCEDURE TRAY) ×1 IMPLANT
PAD ARMBOARD 7.5X6 YLW CONV (MISCELLANEOUS) ×1 IMPLANT
SET HNDPC FAN SPRY TIP SCT (DISPOSABLE) ×1 IMPLANT
STAPLER VISISTAT 35W (STAPLE) IMPLANT
STRIP CLOSURE SKIN 1/2X4 (GAUZE/BANDAGES/DRESSINGS) ×2 IMPLANT
SUT ETHIBOND NAB CT1 #1 30IN (SUTURE) ×1 IMPLANT
SUT MNCRL AB 4-0 PS2 18 (SUTURE) IMPLANT
SUT VIC AB 0 CT1 27 (SUTURE) ×1
SUT VIC AB 0 CT1 27XBRD ANBCTR (SUTURE) ×1 IMPLANT
SUT VIC AB 1 CT1 27 (SUTURE) ×1
SUT VIC AB 1 CT1 27XBRD ANBCTR (SUTURE) ×1 IMPLANT
SUT VIC AB 2-0 CT1 27 (SUTURE) ×1
SUT VIC AB 2-0 CT1 TAPERPNT 27 (SUTURE) ×1 IMPLANT
TIP HIGH FLOW IRRIGATION COAX (MISCELLANEOUS) IMPLANT
TOWEL GREEN STERILE (TOWEL DISPOSABLE) ×1 IMPLANT
TOWEL GREEN STERILE FF (TOWEL DISPOSABLE) ×1 IMPLANT
TRAY CATH INTERMITTENT SS 16FR (CATHETERS) IMPLANT
TRAY FOLEY W/BAG SLVR 16FR (SET/KITS/TRAYS/PACK)
TRAY FOLEY W/BAG SLVR 16FR ST (SET/KITS/TRAYS/PACK) IMPLANT
WATER STERILE IRR 1000ML POUR (IV SOLUTION) ×2 IMPLANT

## 2022-10-24 NOTE — Anesthesia Procedure Notes (Signed)
Anesthesia Regional Block: Peng block   Pre-Anesthetic Checklist: , timeout performed,  Correct Patient, Correct Site, Correct Laterality,  Correct Procedure, Correct Position, site marked,  Risks and benefits discussed,  Surgical consent,  Pre-op evaluation,  At surgeon's request and post-op pain management  Laterality: Right and Lower  Prep: chloraprep       Needles:  Injection technique: Single-shot      Needle Length: 9cm  Needle Gauge: 22     Additional Needles: Arrow StimuQuik ECHO Echogenic Stimulating PNB Needle  Procedures:,,,, ultrasound used (permanent image in chart),,    Narrative:  Start time: 10/24/2022 9:16 AM End time: 10/24/2022 9:24 AM Injection made incrementally with aspirations every 5 mL.  Performed by: Personally  Anesthesiologist: Val Eagle, MD

## 2022-10-24 NOTE — Anesthesia Preprocedure Evaluation (Addendum)
Anesthesia Evaluation  Patient identified by MRN, date of birth, ID band Patient awake    Reviewed: Allergy & Precautions, NPO status , Patient's Chart, lab work & pertinent test results  History of Anesthesia Complications Negative for: history of anesthetic complications  Airway Mallampati: II  TM Distance: >3 FB Neck ROM: Full    Dental  (+) Dental Advisory Given   Pulmonary neg pulmonary ROS   breath sounds clear to auscultation       Cardiovascular hypertension, Pt. on medications (-) angina  Rhythm:Regular Rate:Normal     Neuro/Psych  Headaches    GI/Hepatic negative GI ROS, Neg liver ROS,,,  Endo/Other  Hypothyroidism    Renal/GU negative Renal ROS     Musculoskeletal  (+) Arthritis ,    Abdominal   Peds  Hematology negative hematology ROS (+)   Anesthesia Other Findings   Reproductive/Obstetrics                             Anesthesia Physical Anesthesia Plan  ASA: 2  Anesthesia Plan: Spinal   Post-op Pain Management: Tylenol PO (pre-op)*   Induction:   PONV Risk Score and Plan: 2 and Ondansetron, Dexamethasone and Treatment may vary due to age or medical condition  Airway Management Planned: Natural Airway and Simple Face Mask  Additional Equipment: None  Intra-op Plan:   Post-operative Plan:   Informed Consent: I have reviewed the patients History and Physical, chart, labs and discussed the procedure including the risks, benefits and alternatives for the proposed anesthesia with the patient or authorized representative who has indicated his/her understanding and acceptance.     Dental advisory given  Plan Discussed with: CRNA and Surgeon  Anesthesia Plan Comments:        Anesthesia Quick Evaluation

## 2022-10-24 NOTE — Consult Note (Signed)
Reason for Consult:Right hip fx Referring Physician: Marguerita Merles Time called: 0730 Time at bedside: 0935   Teresa Patterson is an 82 y.o. female.  HPI: Kelen was doing some pressure washing at home when she tripped on the hose and fell. She had immediate right hip pain and could not get up. She was able to scoot across the carport and call for help. She was brought to the ED where x-rays showed a right hip fx and orthopedic surgery was consulted. She lives at home alone and does not use any assistive devices to ambulate.  Past Medical History:  Diagnosis Date   Colon polyp    Hypothyroid    hypothyroid   Migraine    Multiple dysplastic nevi    established with Dr. Karlyn Agee dermatology   Osteopenia     Past Surgical History:  Procedure Laterality Date   ABDOMINAL HYSTERECTOMY  1972   CHOLECYSTECTOMY  1995   OVARIAN CYST REMOVAL Right    right   TONSILLECTOMY AND ADENOIDECTOMY  1952    Family History  Problem Relation Age of Onset   Brain cancer Brother        BRAIN TUMOR   Cancer Maternal Uncle        ?    Social History:  reports that she has never smoked. She has never used smokeless tobacco. She reports that she does not drink alcohol and does not use drugs.  Allergies:  Allergies  Allergen Reactions   Fosamax [Alendronate]     Medications: I have reviewed the patient's current medications.  Results for orders placed or performed during the hospital encounter of 10/23/22 (from the past 48 hour(s))  CBC     Status: Abnormal   Collection Time: 10/23/22  9:09 PM  Result Value Ref Range   WBC 13.0 (H) 4.0 - 10.5 K/uL   RBC 4.28 3.87 - 5.11 MIL/uL   Hemoglobin 12.7 12.0 - 15.0 g/dL   HCT 16.1 09.6 - 04.5 %   MCV 89.0 80.0 - 100.0 fL   MCH 29.7 26.0 - 34.0 pg   MCHC 33.3 30.0 - 36.0 g/dL   RDW 40.9 81.1 - 91.4 %   Platelets 155 150 - 400 K/uL   nRBC 0.0 0.0 - 0.2 %    Comment: Performed at [facility], [address] [place], [place] [address]   Comprehensive metabolic panel     Status: Abnormal   Collection Time: 10/23/22  9:09 PM  Result Value Ref Range   Sodium 138 135 - 145 mmol/L   Potassium 3.4 (L) 3.5 - 5.1 mmol/L   Chloride 103 98 - 111 mmol/L   CO2 25 22 - 32 mmol/L   Glucose, Bld 109 (H) 70 - 99 mg/dL    Comment: Glucose reference range applies only to samples taken after fasting for at least 8 hours.   BUN 11 8 - 23 mg/dL   Creatinine, Ser 6.21 0.44 - 1.00 mg/dL   Calcium 9.7 8.9 - 30.8 mg/dL   Total Protein 6.1 (L) 6.5 - 8.1 g/dL   Albumin 3.7 3.5 - 5.0 g/dL   AST 18 15 - 41 U/L   ALT 12 0 - 44 U/L   Alkaline Phosphatase 44 38 - 126 U/L   Total Bilirubin 0.7 0.3 - 1.2 mg/dL   GFR, Estimated >65 >78 mL/min    Comment: (NOTE) Calculated using the CKD-EPI Creatinine Equation (2021)    Anion gap 10 5 - 15  Comment: Performed at [facility], [address], [place], [place] [address]  Protime-INR     Status: None   Collection Time: 10/23/22  9:09 PM  Result Value Ref Range   Prothrombin Time 15.0 11.4 - 15.2 seconds   INR 1.2 0.8 - 1.2    Comment: (NOTE) INR goal varies based on device and disease states. Performed at [facility], [address], [place], [place] [address]   Type and screen [facility]     Status: None   Collection Time: 10/23/22  9:09 PM  Result Value Ref Range   ABO/RH(D) O POS    Antibody Screen NEG    Sample Expiration      10/26/2022,2359 Performed at [facility], [address], [place], [place] [address]   ABO/Rh     Status: None   Collection Time: 10/23/22  9:50 PM  Result Value Ref Range   ABO/RH(D)      O POS Performed at [facility], [address] [place], [place] [address]   CBC     Status: Abnormal   Collection Time: 10/24/22  1:13 AM  Result Value Ref Range   WBC 10.1 4.0 - 10.5 K/uL   RBC 4.26 3.87 - 5.11 MIL/uL   Hemoglobin 12.3 12.0 - 15.0 g/dL   HCT 62.9 52.8 - 41.3 %   MCV 88.0 80.0 - 100.0 fL   MCH 28.9  26.0 - 34.0 pg   MCHC 32.8 30.0 - 36.0 g/dL   RDW 24.4 01.0 - 27.2 %   Platelets 132 (L) 150 - 400 K/uL   nRBC 0.0 0.0 - 0.2 %    Comment: Performed at [facility], [address] [place], [place] [address]  Basic metabolic panel     Status: Abnormal   Collection Time: 10/24/22  1:13 AM  Result Value Ref Range   Sodium 137 135 - 145 mmol/L   Potassium 3.5 3.5 - 5.1 mmol/L   Chloride 102 98 - 111 mmol/L   CO2 26 22 - 32 mmol/L   Glucose, Bld 121 (H) 70 - 99 mg/dL    Comment: Glucose reference range applies only to samples taken after fasting for at least 8 hours.   BUN 13 8 - 23 mg/dL   Creatinine, Ser 4.03 0.44 - 1.00 mg/dL   Calcium 9.3 8.9 - 47.4 mg/dL   GFR, Estimated >25 >95 mL/min    Comment: (NOTE) Calculated using the CKD-EPI Creatinine Equation (2021)    Anion gap 9 5 - 15    Comment: Performed at [facility], [address] [place], [place] [address]  Magnesium     Status: None   Collection Time: 10/24/22  1:13 AM  Result Value Ref Range   Magnesium 2.0 1.7 - 2.4 mg/dL    Comment: Performed at [facility], [address] [place], [place] [address]    DG Chest Port 1 View  Result Date: 10/23/2022 CLINICAL DATA:  Preoperative evaluation. EXAM: PORTABLE CHEST 1 VIEW COMPARISON:  None Available. FINDINGS: The heart size and mediastinal contours are within normal limits. There is no evidence of an acute infiltrate, pleural effusion or pneumothorax. The visualized skeletal structures are unremarkable. IMPRESSION: No active disease. Electronically Signed   By: Aram Candela M.D.   On: 10/23/2022 23:16   DG Hip Unilat W or Wo Pelvis 2-3 Views Right  Result Date: 10/23/2022 CLINICAL DATA:  Recent  fall with right hip pain, initial encounter EXAM: DG HIP (WITH OR WITHOUT PELVIS) 3V RIGHT COMPARISON:  None Available. FINDINGS: Pelvic ring is intact. There is a right subcapital femoral neck fracture with impaction and angulation at the fracture site. Femoral  head is well seated. No soft tissue abnormality is noted. IMPRESSION: Subcapital right femoral neck fracture. Electronically Signed   By: Alcide Clever M.D.   On: 10/23/2022 19:23    Review of Systems  HENT:  Negative for ear discharge, ear pain, hearing loss and tinnitus.   Eyes:  Negative for photophobia and pain.  Respiratory:  Negative for cough and shortness of breath.   Cardiovascular:  Negative for chest pain.  Gastrointestinal:  Negative for abdominal pain, nausea and vomiting.  Genitourinary:  Negative for dysuria, flank pain, frequency and urgency.  Musculoskeletal:  Positive for arthralgias (Right hip). Negative for back pain, myalgias and neck pain.  Neurological:  Negative for dizziness and headaches.  Hematological:  Does not bruise/bleed easily.  Psychiatric/Behavioral:  The patient is not nervous/anxious.    Blood pressure 139/67, pulse 60, temperature 97.7 F (36.5 C), resp. rate 12, height 5\' 3"  (1.6 m), weight 59.1 kg, SpO2 98 %. Physical Exam Constitutional:      General: She is not in acute distress.    Appearance: She is well-developed. She is not diaphoretic.  HENT:     Head: Normocephalic and atraumatic.  Eyes:     General: No scleral icterus.       Right eye: No discharge.        Left eye: No discharge.     Conjunctiva/sclera: Conjunctivae normal.  Cardiovascular:     Rate and Rhythm: Normal rate and regular rhythm.  Pulmonary:     Effort: Pulmonary effort is normal. No respiratory distress.  Musculoskeletal:     Cervical back: Normal range of motion.     Comments: RLE No traumatic wounds, ecchymosis, or rash  Mild TTP hip  No knee or ankle effusion  Knee stable to varus/ valgus and anterior/posterior stress  Sens DPN, SPN, TN intact  Motor EHL, ext, flex, evers 5/5  DP 2+, PT 2+, No significant edema  Skin:    General: Skin is warm and dry.  Neurological:     Mental Status: She is alert.  Psychiatric:        Mood and Affect: Mood normal.         Behavior: Behavior normal.     Assessment/Plan: Right hip fx -- Plan THA today by Dr. Magnus Ivan. Please keep NPO. Multiple medical problems including hypothyroidism and migraines -- per primary service    Freeman Caldron, PA-C Orthopedic Surgery [phone] 10/24/2022, 10:34 AM

## 2022-10-24 NOTE — Transfer of Care (Signed)
Immediate Anesthesia Transfer of Care Note  Patient: Teresa Patterson  Procedure(s) Performed: TOTAL HIP ARTHROPLASTY ANTERIOR APPROACH (Right: Hip)  Patient Location: PACU  Anesthesia Type:Spinal  Level of Consciousness: awake, alert , and oriented  Airway & Oxygen Therapy: Patient Spontanous Breathing and Patient connected to nasal cannula oxygen  Post-op Assessment: Report given to RN, Post -op Vital signs reviewed and stable, Patient moving all extremities X 4, and Patient able to stick tongue midline  Post vital signs: Reviewed  Last Vitals:  Vitals Value Taken Time  BP 108/68   Temp 97.5   Pulse 60 10/24/22 1847  Resp 14 10/24/22 1847  SpO2 98 % 10/24/22 1847  Vitals shown include unvalidated device data.  Last Pain:  Vitals:   10/24/22 1514  TempSrc: Axillary  PainSc: 0-No pain         Complications: No notable events documented.

## 2022-10-24 NOTE — Anesthesia Procedure Notes (Signed)
Spinal  Patient location during procedure: OR End time: 10/24/2022 5:25 PM Reason for block: surgical anesthesia Staffing Performed: anesthesiologist  Anesthesiologist: Jairo Ben, MD Performed by: Jairo Ben, MD Authorized by: Jairo Ben, MD   Preanesthetic Checklist Completed: patient identified, IV checked, site marked, risks and benefits discussed, surgical consent, monitors and equipment checked, pre-op evaluation and timeout performed Spinal Block Patient position: sitting Prep: DuraPrep Patient monitoring: heart rate, cardiac monitor, continuous pulse ox and blood pressure Approach: midline Location: L3-4 Injection technique: single-shot Needle Needle type: Pencan and Introducer  Needle gauge: 24 G Needle length: 9 cm Assessment Sensory level: T4 Events: CSF return Additional Notes Pt identified in Operating room.  Monitors applied. Working IV access confirmed. Sterile prep, drape lumbar spine.  1% lido local L 3,4.  #24ga Pencan into clear CSF L 3,4.  12mg  0.75% Bupivacaine with dextrose injected with asp CSF beginning and end of injection.  Patient asymptomatic, VSS, no heme aspirated, tolerated well.  Sandford Craze, MD

## 2022-10-24 NOTE — Progress Notes (Signed)
Consent signed and in chart. Pre procedure checklist complete

## 2022-10-24 NOTE — Anesthesia Postprocedure Evaluation (Signed)
Anesthesia Post Note  Patient: DELEON SPRENKLE  Procedure(s) Performed: TOTAL HIP ARTHROPLASTY ANTERIOR APPROACH (Right: Hip)     Patient location during evaluation: PACU Anesthesia Type: Spinal Level of consciousness: oriented and awake and alert Pain management: pain level controlled Vital Signs Assessment: post-procedure vital signs reviewed and stable Respiratory status: spontaneous breathing, respiratory function stable and patient connected to nasal cannula oxygen Cardiovascular status: blood pressure returned to baseline and stable Postop Assessment: no headache, no backache and no apparent nausea or vomiting Anesthetic complications: no   No notable events documented.                 Shelton Silvas

## 2022-10-24 NOTE — Progress Notes (Signed)
Pt returned from OR. Having nausea after iv dilaudid. No iv antiemetics available. The PRN phenergan d/c'd at 2000. Provider paged.

## 2022-10-24 NOTE — Progress Notes (Signed)
PROGRESS NOTE    Teresa Patterson  ZOX:[id number] DOB: 04-Apr-1941 DOA: 10/23/2022 PCP: Natalia Leatherwood, DO   Brief Narrative:  The patient is an 82 year old pleasant Caucasian female with a past medical history significant for but not limited to hypothyroidism as well as migraines and other comorbidities who presented to the emergency department with severe right hip pain and inability to bear weight after she sustained a ground-level fall.  She was in her usual state of health when she was walking a mile without incident and had just finished power washing her porch when she was going to turn off her water and tripped over the hose and landed on her right hip.  She denied hitting her head or losing consciousness but has been experiencing severe right hip pain.  She was wearing her right elbow on the ground but denies pain aside from the right hip pain.  He denies any recent illnesses and denies a history of heart disease.  She was brought to the ED and was found to be afebrile and saturating well on room air.  Plain radiographs demonstrate a subcapital right femoral neck fracture and labs are notable for an elevated WBC.  Orthopedic surgery was consulted and type screen was performed and she was given IV fluids and pain control with fentanyl.  She received a nerve block this morning in anticipation for surgical intervention and the current plan is to proceed with a right total hip arthroplasty to address her right acute hip femoral neck fracture.  Assessment and Plan  Right Hip Fracture  -Based on the available data, Teresa Patterson presents an estimated 0.2% risk of perioperative MI or cardiac arrest; no further preoperative cardiac testing is indicated  -Leukocytosis trend: Recent Labs  Lab 10/23/22 2109 10/24/22 0113  WBC 13.0* 10.1  -Continue pain-control and was given acetaminophen 1000 mg IV x 1 as well has hydrocodone-acetaminophen 1-2 tabs every 6 hours as needed for moderate pain and IV  hydromorphone 0.5 mg every 4 as needed severe pain; also has methocarbamol 500 and was p.o./IV every 6 as needed for muscle spasms -Patient received tranexamic acid 1000 mg x 1 -C/w supportive care  -NPO after midnight given that she is undergoing surgical intervention today -Other care per orthopedic surgery and weightbearing status as well as VTE prophylaxis per orthopedic surgery   Hypothyroidism  -Continue Levothyroxine 100 mcg p.o. daily   Hypokalemia -Patient's K+ Level Trend: Recent Labs  Lab 10/23/22 2109 10/24/22 0113  K 3.4* 3.5  -Hydrochlorothiazide 25 mg p.o. daily -Replete with p.o. potassium chloride 20 mEq x 1 -Continue to Monitor and Replete as Necessary -Repeat CMP in the AM   Thrombocytopenia -Platelet Count Trend Recent Labs  Lab 10/23/22 2109 10/24/22 0113  PLT 155 132*  -Continue to monitor for signs and symptoms bleeding; no overt bleeding noted -Pete CBC in a.m.  History of Migraines  -Continue to hold sumatriptan 5 mg per actuation spray 1 spray into another 2 hours as needed   DVT prophylaxis: SCDs Start: 10/23/22 2330    Code Status: Full Code Family Communication: Discussed with the patient's daughter at bedside  Disposition Plan:  Level of care: Med-Surg Status is: Inpatient Remains inpatient appropriate because: Further clinical improvement and clearance by the orthopedic specialist and evaluation by PT and OT to ensure safe discharge disposition  Consultants:  Orthopedic Surgery  Procedures:  As delineated as above  Antimicrobials:  Anti-infectives (From admission, onward)    Start     Dose/Rate  Route Frequency Ordered Stop   10/25/22 0600  ceFAZolin (ANCEF) IVPB 2g/100 mL premix        2 g 200 mL/hr over 30 Minutes Intravenous On call to O.R. 10/24/22 1447 10/26/22 0559       Subjective: Seen and examined at bedside and states that she is is having quite a bit of pain but had just received a nerve block which he thinks is  helping.  No nausea or vomiting.  This that she feels okay and states that she tripped over the pressure washer hose.  No other concerns or question this time.  Objective: Vitals:   10/24/22 0915 10/24/22 0930 10/24/22 1500 10/24/22 1514  BP: 138/69 139/67  (!) 143/69  Pulse: (!) 58 60    Resp: 14 12  12   Temp:    98 F (36.7 C)  TempSrc:    Axillary  SpO2: 97% 98% 92% 97%  Weight:      Height:        Intake/Output Summary (Last 24 hours) at 10/24/2022 1527 Last data filed at 10/24/2022 9147 Gross per 24 hour  Intake 466.36 ml  Output --  Net 466.36 ml   Filed Weights   10/23/22 1812  Weight: 59.1 kg   Examination: Physical Exam:  Constitutional: Thin Caucasian female in no acute distress appears calm Respiratory: Diminished to auscultation bilaterally, no wheezing, rales, rhonchi or crackles. Normal respiratory effort and patient is not tachypenic. No accessory muscle use.  Unlabored breathing Cardiovascular: RRR, no murmurs / rubs / gallops. S1 and S2 auscultated. No extremity edema.  Abdomen: Soft, non-tender, non-distended. Bowel sounds positive.  GU: Deferred. Musculoskeletal: No clubbing / cyanosis of digits/nails.  Right leg is shorter and externally rotated compared to the left  Skin: No rashes, lesions, ulcers on limited skin evaluation. No induration; Warm and dry.  Neurologic: CN 2-12 grossly intact with no focal deficits. Romberg sign and cerebellar reflexes not assessed.  Psychiatric: Normal judgment and insight. Alert and oriented x 3. Normal mood and appropriate affect.   Data Reviewed: I have personally reviewed following labs and imaging studies  CBC: Recent Labs  Lab 10/23/22 2109 10/24/22 0113  WBC 13.0* 10.1  HGB 12.7 12.3  HCT 38.1 37.5  MCV 89.0 88.0  PLT 155 132*   Basic Metabolic Panel: Recent Labs  Lab 10/23/22 2109 10/24/22 0113  NA 138 137  K 3.4* 3.5  CL 103 102  CO2 25 26  GLUCOSE 109* 121*  BUN 11 13  CREATININE 0.81 0.81   CALCIUM 9.7 9.3  MG  --  2.0   GFR: Estimated Creatinine Clearance: 44.3 mL/min (by C-G formula based on SCr of 0.81 mg/dL). Liver Function Tests: Recent Labs  Lab 10/23/22 2109  AST 18  ALT 12  ALKPHOS 44  BILITOT 0.7  PROT 6.1*  ALBUMIN 3.7   No results for input(s): "LIPASE", "AMYLASE" in the last 168 hours. No results for input(s): "AMMONIA" in the last 168 hours. Coagulation Profile: Recent Labs  Lab 10/23/22 2109  INR 1.2   Cardiac Enzymes: No results for input(s): "CKTOTAL", "CKMB", "CKMBINDEX", "TROPONINI" in the last 168 hours. BNP (last 3 results) No results for input(s): "PROBNP" in the last 8760 hours. HbA1C: No results for input(s): "HGBA1C" in the last 72 hours. CBG: No results for input(s): "GLUCAP" in the last 168 hours. Lipid Profile: No results for input(s): "CHOL", "HDL", "LDLCALC", "TRIG", "CHOLHDL", "LDLDIRECT" in the last 72 hours. Thyroid Function Tests: No results for input(s): "TSH", "  T4TOTAL", "FREET4", "T3FREE", "THYROIDAB" in the last 72 hours. Anemia Panel: No results for input(s): "VITAMINB12", "FOLATE", "FERRITIN", "TIBC", "IRON", "RETICCTPCT" in the last 72 hours. Sepsis Labs: No results for input(s): "PROCALCITON", "LATICACIDVEN" in the last 168 hours.  No results found for this or any previous visit (from the past 240 hour(s)).   Radiology Studies: DG Chest Port 1 View  Result Date: 10/23/2022 CLINICAL DATA:  Preoperative evaluation. EXAM: PORTABLE CHEST 1 VIEW COMPARISON:  None Available. FINDINGS: The heart size and mediastinal contours are within normal limits. There is no evidence of an acute infiltrate, pleural effusion or pneumothorax. The visualized skeletal structures are unremarkable. IMPRESSION: No active disease. Electronically Signed   By: Aram Candela M.D.   On: 10/23/2022 23:16   DG Hip Unilat W or Wo Pelvis 2-3 Views Right  Result Date: 10/23/2022 CLINICAL DATA:  Recent fall with right hip pain, initial encounter  EXAM: DG HIP (WITH OR WITHOUT PELVIS) 3V RIGHT COMPARISON:  None Available. FINDINGS: Pelvic ring is intact. There is a right subcapital femoral neck fracture with impaction and angulation at the fracture site. Femoral head is well seated. No soft tissue abnormality is noted. IMPRESSION: Subcapital right femoral neck fracture. Electronically Signed   By: Alcide Clever M.D.   On: 10/23/2022 19:23    Scheduled Meds:  chlorhexidine  60 mL Topical Once   chlorhexidine  15 mL Mouth/Throat Once   Or   mouth rinse  15 mL Mouth Rinse Once   chlorhexidine       [MAR Hold] fentaNYL (SUBLIMAZE) injection  25 mcg Intravenous Once   [MAR Hold] levothyroxine  100 mcg Oral Daily   [MAR Hold] multivitamin with minerals  1 tablet Oral Daily   ondansetron       [MAR Hold] oxybutynin  10 mg Oral Daily   Continuous Infusions:  acetaminophen     [START ON 10/25/2022]  ceFAZolin (ANCEF) IV     lactated ringers     [MAR Hold] methocarbamol (ROBAXIN) IV     tranexamic acid      LOS: 1 day   Marguerita Merles, DO [facility] Available via Epic secure chat 7am-7pm After these hours, please refer to coverage provider listed on [email or web address] 10/24/2022, 3:27 PM

## 2022-10-24 NOTE — Progress Notes (Signed)
Report called to short stay. Nurse verbalized understanding of report and had no further questions.

## 2022-10-24 NOTE — Progress Notes (Signed)
Pt transported to short stay via bed by transportation staff

## 2022-10-24 NOTE — TOC CM/SW Note (Signed)
Transition of Care [facility]) - Inpatient Brief Assessment   Patient Details  Name: Teresa Patterson MRN: [id number] Date of Birth: [birth year]  Transition of Care [facility]) CM/SW Contact:    Carley Hammed, LCSW Phone Number: 10/24/2022, 11:13 AM   Clinical Narrative: CSW acknowledges consult for possible SNF placement. Pt to have surgery today, PT to see when appropriate. TOC will continue to follow to assist with disposition needs.    Transition of Care Asessment: Insurance and Status: Insurance coverage has been reviewed Patient has primary care physician: Yes Home environment has been reviewed: Home alone Prior level of function:: Modified independent Prior/Current Home Services: No current home services Social Determinants of Health Reivew: SDOH reviewed no interventions necessary Readmission risk has been reviewed: Yes Transition of care needs: transition of care needs identified, TOC will continue to follow

## 2022-10-24 NOTE — Addendum Note (Signed)
Addendum  created 10/24/22 1918 by Shelton Silvas, MD   Attestation recorded in Intraprocedure, Intraprocedure Attestations filed

## 2022-10-24 NOTE — Hospital Course (Signed)
The patient is an 82 year old pleasant Caucasian female with a past medical history significant for but not limited to hypothyroidism as well as migraines and other comorbidities who presented to the emergency department with severe right hip pain and inability to bear weight after she sustained a ground-level fall.  She was in her usual state of health when she was walking a mile without incident and had just finished power washing her porch when she was going to turn off her water and tripped over the hose and landed on her right hip.  She denied hitting her head or losing consciousness but has been experiencing severe right hip pain.  She was wearing her right elbow on the ground but denies pain aside from the right hip pain.  He denies any recent illnesses and denies a history of heart disease.  She was brought to the ED and was found to be afebrile and saturating well on room air.  Plain radiographs demonstrate a subcapital right femoral neck fracture and labs are notable for an elevated WBC.  Orthopedic surgery was consulted and type screen was performed and she was given IV fluids and pain control with fentanyl.  She received a nerve block this morning in anticipation for surgical intervention and the current plan is to proceed with a right total hip arthroplasty to address her right acute hip femoral neck fracture.  Assessment and Plan  Right Hip Fracture  -Based on the available data, Mrs. Barsanti presents an estimated 0.2% risk of perioperative MI or cardiac arrest; no further preoperative cardiac testing is indicated  -Leukocytosis trend: Recent Labs  Lab 10/23/22 2109 10/24/22 0113  WBC 13.0* 10.1  -Continue pain-control and was given acetaminophen 1000 mg IV x 1 as well has hydrocodone-acetaminophen 1-2 tabs every 6 hours as needed for moderate pain and IV hydromorphone 0.5 mg every 4 as needed severe pain; also has methocarbamol 500 and was p.o./IV every 6 as needed for muscle  spasms -Patient received tranexamic acid 1000 mg x 1 -C/w supportive care  -NPO after midnight given that she is undergoing surgical intervention today -Other care per orthopedic surgery and weightbearing status as well as VTE prophylaxis per orthopedic surgery   Hypothyroidism  -Continue Levothyroxine 100 mcg p.o. daily   Hypokalemia -Patient's K+ Level Trend: Recent Labs  Lab 10/23/22 2109 10/24/22 0113  K 3.4* 3.5  -Hydrochlorothiazide 25 mg p.o. daily -Replete with p.o. potassium chloride 20 mEq x 1 -Continue to Monitor and Replete as Necessary -Repeat CMP in the AM   Thrombocytopenia -Platelet Count Trend Recent Labs  Lab 10/23/22 2109 10/24/22 0113  PLT 155 132*  -Continue to monitor for signs and symptoms bleeding; no overt bleeding noted -Pete CBC in a.m.  History of Migraines  -Continue to hold sumatriptan 5 mg per actuation spray 1 spray into another 2 hours as needed

## 2022-10-24 NOTE — Progress Notes (Signed)
Pt transported to PACU for nerve block via bed by transportation  staff.

## 2022-10-24 NOTE — Op Note (Signed)
Operative Note  Date of operation: 10/24/2022 Preoperative diagnosis: Right hip displaced subcapital femoral neck fracture Postoperative diagnosis: Same  Procedure: Right direct anterior total hip arthroplasty  Implants: Implant Name Type Inv. Item Serial No. Manufacturer Lot No. LRB No. Used Action  CUP SECTOR GRIPTON - [id number] Cup CUP SECTOR GRIPTON  [employer]PU[employer] [id number] Right 1 Implanted  LINER ACETABULAR 32X50 - [id number] Liner LINER ACETABULAR 32X50  DEPUY ORTHOPAEDICS [id number] Right 1 Implanted  FEM STEM 12/14 TAPER SZ 4 HIP - [id number] Orthopedic Implant FEM STEM 12/14 TAPER SZ 4 HIP  DEPUY ORTHOPAEDICS [id number] Right 1 Implanted  HEAD FEM STD 32X+5 STRL - [id number] Hips HEAD FEM STD 32X+5 Jolayne [employer]aines [employer] [id number] Right 1 Implanted   Surgeon: Vanita Panda. Magnus Ivan, MD Assistant: Youlanda Roys, RNFA  Anesthesia: Spinal EBL: 300 to 350 cc Antibiotics: 2 g IV Ancef Complications: None  Indications: The patient is an 82 year old female who sustained a mechanical fall at home yesterday when she was cleaning her porch.  She landed awkwardly on her right hip and had the inability to ambulate after that.  She was brought to the [facility] emergency room and found to have a displaced right hip femoral neck fracture.  She was graciously admitted to the medicine service and has been recommended she undergo a total hip arthroplasty.  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  She is very active individual.  I did explain in length in detail the risk of acute blood loss anemia, nerve or vessel injury, fracture, infection, DVT, implant failure, dislocation, leg length differences and wound healing issues.  She understands her goals are decreased pain, improve mobility and hopefully overall improve quality of life.  Procedure description: After informed consent was obtained and the appropriate right  hip was marked, the patient was brought to the operating room and set up on the stretcher where spinal anesthesia was obtained.  She was then laid in supine position on the stretcher and a Foley catheter was placed.  Traction boots were placed on both her feet and she was placed supine on the Hana fracture table with a perineal post in place in both legs and inline skeletal traction devices but no traction applied.  Her right operative hip and pelvis were assessed radiographically.  The right hip was then prepped and draped with DuraPrep and sterile drapes.  A timeout was called and she identified as correct patient the correct right hip.  An incision was then made just inferior and posterior the ASIS and carried slightly obliquely down the leg.  Dissection was carried down to the tensor fascia lata muscle and tensor fascia was then divided longitudinally to proceed with direct interposed the hip.  Circumflex vessels were identified and cauterized.  The hip capsule identified and opened up in L-type format finding a hemarthrosis and a displaced femoral neck fracture.  Cobra retractors were placed around the remnants of the medial lateral femoral neck and a freshening femoral neck cut was made just proximal to the lesser trochanter and distal to the fracture.  This was made with an oscillating saw and completed an osteotome.  A corkscrew guide was placed in the femoral head and the femoral head was removed in its entirety.  A bent Hohmann was then placed over the medial acetabular rim and remnants of the acetabular labrum and other debris removed.  Reaming was then initiated from a size  43 reamer and stepwise increments going up to a size 49 reamer with all reamers placed on direct visualization and the last reamer placed under direct fluoroscopy in order to obtain the depth of reaming, the inclination and anteversion.  The real DePuy sector GRIPTION acetabular component size 50 was then placed without difficulty  followed by a 32+0 neutral polythene liner.  Attention was then turned to the femur.  With the right leg externally rotated to 120 degrees, extended and adducted, a Mueller retractor was placed medially and a Hohmann retractor was placed behind the greater trochanter.  A box cutting osteotome was used to enter the femoral canal.  The lateral joint capsule was released.  Broaching was then initiated using the Actis broaching system from a size 0 and stepwise increments going up to a size 4.  With a size 4 broach in place we trialed a standard offset femoral neck and a 32+1 hip ball.  The right leg was brought over and up and with traction and internal rotation reduced in the pelvis.  Based on radiographic and clinical assessment we needed more offset and leg length.  The hip was dislocated and remove the trial components.  We then placed the real Actis femoral component size 4 but 1 with high offset and wound with the real 32+5 metal head ball.  This reduced in the acetabulum it was nice and tight.  It was stable clinically and radiographically were pleased with leg length and offset.  The soft tissue was then irrigated normal saline solution.  The joint capsule was closed with interrupted #1 Ethibond suture followed by #1 Vicryl close the tensor fascia.  0 Vicryl was used to close the deep tissue and 2-0 Vicryl was used to close subcutaneous tissue.  The skin was closed with staples.  An Aquacel dressing was applied.  The patient was taken off the Hana table and taken to recovery in stable condition.

## 2022-10-24 NOTE — Progress Notes (Signed)
Initial Nutrition Assessment  DOCUMENTATION CODES:   Not applicable  INTERVENTION:  Multivitamin w/ minerals daily Recommend regular diet once appropriate for advancement  NUTRITION DIAGNOSIS:   Increased nutrient needs related to hip fracture as evidenced by estimated needs.  GOAL:   Patient will meet greater than or equal to 90% of their needs  MONITOR:   PO intake, I & O's, Labs  REASON FOR ASSESSMENT:  Consult Hip fracture protocol  ASSESSMENT:   82 y.o. female presented to the ED after a fall with R hip pain. PMH includes hypothyroidism. Pt admitted with R hip fracture and hypokalemia.   Currently NPO for OR this afternoon.  Pt laying in bed, daughters at bedside. Pt reports that her appetite has been well. She does most of her cooking at home or lives next door to daughter that she eats with. Denies any recent weight changes. Per EMR, pt with no weight changes over the past year.   Discussed increased needs post-op to assist in healing. Pt declines oral nutrition supplements at this time. RD provided and reviewed menu with pt and daughters. Discussed that they can bring in food post-op as well if desired by pt.   Medications reviewed. Labs reviewed: Sodium 137, Potassium 3.5, Magnesium 2.0   NUTRITION - FOCUSED PHYSICAL EXAM:  Deferred to follow-up.   Diet Order:   Diet Order             Diet NPO time specified Except for: Sips with Meds  Diet effective now                  EDUCATION NEEDS:   Education needs have been addressed  Skin:  Skin Assessment: Reviewed RN Assessment  Last BM:  6/3  Height:  Ht Readings from Last 1 Encounters:  10/23/22 5\' 3"  (1.6 m)   Weight:  Wt Readings from Last 1 Encounters:  10/23/22 59.1 kg   Ideal Body Weight:  52.3 kg  BMI:  Body mass index is 23.08 kg/m.  Estimated Nutritional Needs:  Kcal:  1600-1800 Protein:  80-100 grams Fluid:  >/= 1.6 L   Kirby Crigler RD, LDN Clinical Dietitian See  [facility] for contact information.

## 2022-10-24 NOTE — Anesthesia Preprocedure Evaluation (Signed)
Anesthesia Evaluation  Patient identified by MRN, date of birth, ID band Patient awake    Reviewed: Allergy & Precautions, Patient's Chart, lab work & pertinent test results  History of Anesthesia Complications Negative for: history of anesthetic complications  Airway Mallampati: II  TM Distance: >3 FB Neck ROM: Full    Dental no notable dental hx.    Pulmonary neg pulmonary ROS   breath sounds clear to auscultation       Cardiovascular negative cardio ROS  Rhythm:Regular     Neuro/Psych  Headaches, neg Seizures  negative psych ROS   GI/Hepatic negative GI ROS, Neg liver ROS,,,  Endo/Other  Hypothyroidism    Renal/GU negative Renal ROS     Musculoskeletal  (+) Arthritis ,  Right hip fx   Abdominal   Peds  Hematology negative hematology ROS (+)   Anesthesia Other Findings   Reproductive/Obstetrics                             Anesthesia Physical Anesthesia Plan  ASA: 2  Anesthesia Plan: Regional   Post-op Pain Management:    Induction:   PONV Risk Score and Plan: 2 and Treatment may vary due to age or medical condition  Airway Management Planned: Natural Airway and Nasal Cannula  Additional Equipment: None  Intra-op Plan:   Post-operative Plan:   Informed Consent: I have reviewed the patients History and Physical, chart, labs and discussed the procedure including the risks, benefits and alternatives for the proposed anesthesia with the patient or authorized representative who has indicated his/her understanding and acceptance.       Plan Discussed with:   Anesthesia Plan Comments:        Anesthesia Quick Evaluation

## 2022-10-25 ENCOUNTER — Encounter (HOSPITAL_COMMUNITY): Payer: Self-pay | Admitting: Orthopaedic Surgery

## 2022-10-25 DIAGNOSIS — W19XXXA Unspecified fall, initial encounter: Secondary | ICD-10-CM | POA: Diagnosis not present

## 2022-10-25 DIAGNOSIS — E876 Hypokalemia: Secondary | ICD-10-CM | POA: Diagnosis not present

## 2022-10-25 DIAGNOSIS — E034 Atrophy of thyroid (acquired): Secondary | ICD-10-CM | POA: Diagnosis not present

## 2022-10-25 DIAGNOSIS — Y92009 Unspecified place in unspecified non-institutional (private) residence as the place of occurrence of the external cause: Secondary | ICD-10-CM

## 2022-10-25 DIAGNOSIS — S72001A Fracture of unspecified part of neck of right femur, initial encounter for closed fracture: Secondary | ICD-10-CM | POA: Diagnosis not present

## 2022-10-25 DIAGNOSIS — I10 Essential (primary) hypertension: Secondary | ICD-10-CM

## 2022-10-25 LAB — CBC WITH DIFFERENTIAL/PLATELET
Abs Immature Granulocytes: 0.05 10*3/uL (ref 0.00–0.07)
Basophils Absolute: 0 10*3/uL (ref 0.0–0.1)
Basophils Relative: 0 %
Eosinophils Absolute: 0.1 10*3/uL (ref 0.0–0.5)
Eosinophils Relative: 1 %
HCT: 33 % — ABNORMAL LOW (ref 36.0–46.0)
Hemoglobin: 10.6 g/dL — ABNORMAL LOW (ref 12.0–15.0)
Immature Granulocytes: 1 %
Lymphocytes Relative: 9 %
Lymphs Abs: 0.8 10*3/uL (ref 0.7–4.0)
MCH: 29.2 pg (ref 26.0–34.0)
MCHC: 32.1 g/dL (ref 30.0–36.0)
MCV: 90.9 fL (ref 80.0–100.0)
Monocytes Absolute: 0.6 10*3/uL (ref 0.1–1.0)
Monocytes Relative: 6 %
Neutro Abs: 8 10*3/uL — ABNORMAL HIGH (ref 1.7–7.7)
Neutrophils Relative %: 83 %
Platelets: 115 10*3/uL — ABNORMAL LOW (ref 150–400)
RBC: 3.63 MIL/uL — ABNORMAL LOW (ref 3.87–5.11)
RDW: 14 % (ref 11.5–15.5)
WBC: 9.6 10*3/uL (ref 4.0–10.5)
nRBC: 0 % (ref 0.0–0.2)

## 2022-10-25 LAB — COMPREHENSIVE METABOLIC PANEL
ALT: 29 U/L (ref 0–44)
AST: 40 U/L (ref 15–41)
Albumin: 2.8 g/dL — ABNORMAL LOW (ref 3.5–5.0)
Alkaline Phosphatase: 52 U/L (ref 38–126)
Anion gap: 7 (ref 5–15)
BUN: 12 mg/dL (ref 8–23)
CO2: 26 mmol/L (ref 22–32)
Calcium: 8.7 mg/dL — ABNORMAL LOW (ref 8.9–10.3)
Chloride: 102 mmol/L (ref 98–111)
Creatinine, Ser: 0.7 mg/dL (ref 0.44–1.00)
GFR, Estimated: >60 mL/min (ref >60)
Glucose, Bld: 114 mg/dL — ABNORMAL HIGH (ref 70–99)
Potassium: 3.6 mmol/L (ref 3.5–5.1)
Sodium: 135 mmol/L (ref 135–145)
Total Bilirubin: 0.8 mg/dL (ref 0.3–1.2)
Total Protein: 5 g/dL — ABNORMAL LOW (ref 6.5–8.1)

## 2022-10-25 LAB — MAGNESIUM: Magnesium: 2 mg/dL (ref 1.7–2.4)

## 2022-10-25 LAB — PHOSPHORUS: Phosphorus: 2.6 mg/dL (ref 2.5–4.6)

## 2022-10-25 NOTE — Evaluation (Signed)
Physical Therapy Evaluation Patient Details Name: Teresa Patterson MRN: [id number] DOB: 1941/04/23 Today's Date: 10/25/2022  History of Present Illness  Pt is an 82 y/o F admitted on 10/23/22 after presenting to the ED following a fall resulting in R hip pain & inability to bear weight. Pt found to have R acute hip femoral neck fx & underwent R direct anterior THA. PMH: hypothyroidism, migraines  Clinical Impression  Pt seen for PT evaluation with pt agreeable. Pt reports prior to admission she was independent without AD, very active, living alone but notes she has 3 daughters that can assist her at d/c. PT educates pt on RLE strengthening exercises & pt requires AAROM to complete. Pt is able to complete bed mobility, STS with RW, and gait attempts with RW & min assist overall. Pt endorses R hip pain up to 7/10 with mobility but is very motivated to improve. Educated pt on need to continue mobilizing with nursing staff throughout the day (have them assist her with gait to/from bathroom PRN). Recommend ongoing PT services to address strengthening, balance, & endurance to increase independence with mobility tasks.   Recommendations for follow up therapy are one component of a multi-disciplinary discharge planning process, led by the attending physician.  Recommendations may be updated based on patient status, additional functional criteria and insurance authorization.  Follow Up Recommendations       Assistance Recommended at Discharge Intermittent Supervision/Assistance  Patient can return home with the following  A little help with walking and/or transfers;A little help with bathing/dressing/bathroom;Assistance with cooking/housework;Assist for transportation;Help with stairs or ramp for entrance    Equipment Recommendations Rolling walker (2 wheels);BSC/3in1  Recommendations for Other Services  OT consult    Functional Status Assessment Patient has had a recent decline in their functional status  and demonstrates the ability to make significant improvements in function in a reasonable and predictable amount of time.     Precautions / Restrictions Precautions Precautions: Fall Restrictions Weight Bearing Restrictions: Yes RLE Weight Bearing: Weight bearing as tolerated      Mobility  Bed Mobility Overal bed mobility: Needs Assistance Bed Mobility: Supine to Sit     Supine to sit: Min assist     General bed mobility comments: assistance to move RLE to EOB, cuing to use bed rails & HOB elevated, extra time to upright trunk & square up to sitting EOB    Transfers Overall transfer level: Needs assistance Equipment used: Rolling walker (2 wheels) Transfers: Sit to/from Stand, Bed to chair/wheelchair/BSC Sit to Stand: Min assist   Step pivot transfers: Min assist       General transfer comment: STS from EOB & recliner with min assist, education re: hand placement    Ambulation/Gait Ambulation/Gait assistance: Min assist Gait Distance (Feet):  (3 ft forwards + 3 ft backwards) Assistive device: Rolling walker (2 wheels) Gait Pattern/deviations: Decreased step length - right, Decreased step length - left, Decreased stride length, Decreased dorsiflexion - right, Decreased dorsiflexion - left Gait velocity: decreased     General Gait Details: decreased heel strike BLE, decreased foot clearance BLE  Stairs            Wheelchair Mobility    Modified Rankin (Stroke Patients Only)       Balance Overall balance assessment: Needs assistance Sitting-balance support: Feet supported Sitting balance-Leahy Scale: Good Sitting balance - Comments: supervision static sitting   Standing balance support: Bilateral upper extremity supported, During functional activity, Reliant on assistive device for balance Standing balance-Leahy  Scale: Poor                               Pertinent Vitals/Pain Pain Assessment Pain Assessment: 0-10 Pain Score: 7  Pain  Location: R hip with movement Pain Descriptors / Indicators: Discomfort, Grimacing, Guarding Pain Intervention(s): Monitored during session, Limited activity within patient's tolerance, Repositioned, Premedicated before session    Home Living Family/patient expects to be discharged to:: Private residence Living Arrangements: Alone Available Help at Discharge: Family;Available PRN/intermittently Type of Home: House Home Access: Stairs to enter Entrance Stairs-Rails:  (has 2 handles (vs formal rails) she can hold to) Entrance Stairs-Number of Steps: 2   Home Layout: Two level;Able to live on main level with bedroom/bathroom Home Equipment: None      Prior Function Prior Level of Function : Independent/Modified Independent;Driving             Mobility Comments: Independent, walks 1 mile/day, has a pool she maintains, was power washing her porch when this happened. ADLs Comments: independent     Hand Dominance        Extremity/Trunk Assessment   Upper Extremity Assessment Upper Extremity Assessment: Overall WFL for tasks assessed    Lower Extremity Assessment Lower Extremity Assessment: RLE deficits/detail RLE Deficits / Details: 2/5 knee extension in sitting    Cervical / Trunk Assessment Cervical / Trunk Assessment: Normal  Communication   Communication: No difficulties  Cognition Arousal/Alertness: Awake/alert Behavior During Therapy: WFL for tasks assessed/performed Overall Cognitive Status: Within Functional Limits for tasks assessed                                          General Comments General comments (skin integrity, edema, etc.): Pt received on 3L/min, attempted to wean to room air but SpO2 reading as low as 83% after gait (question accuracy of reading & pt denying SOB). Pt placed back on 2L/min with improvement to 94%. Nurse made aware.    Exercises Total Joint Exercises Ankle Circles/Pumps: AROM, Supine, Right, 10 reps Heel  Slides: AAROM, Supine, Strengthening, Right, 10 reps Hip ABduction/ADduction: AAROM, Supine, Strengthening, Right, 10 reps (hip abduction slides) Straight Leg Raises: Supine, AAROM, Strengthening, Right, 10 reps Long Arc Quad: AROM, Seated, Strengthening, Right, 10 reps   Assessment/Plan    PT Assessment Patient needs continued PT services  PT Problem List Decreased strength;Pain;Cardiopulmonary status limiting activity;Decreased range of motion;Decreased activity tolerance;Decreased balance;Decreased mobility;Decreased safety awareness;Decreased knowledge of use of DME       PT Treatment Interventions DME instruction;Therapeutic exercise;Gait training;Balance training;Stair training;Neuromuscular re-education;Functional mobility training;Therapeutic activities;Patient/family education;Modalities    PT Goals (Current goals can be found in the Care Plan section)  Acute Rehab PT Goals Patient Stated Goal: get better, return to PLOF PT Goal Formulation: With patient Time For Goal Achievement: 11/08/22 Potential to Achieve Goals: Good    Frequency 7X/week     Co-evaluation               AM-PAC PT "6 Clicks" Mobility  Outcome Measure Help needed turning from your back to your side while in a flat bed without using bedrails?: A Little Help needed moving from lying on your back to sitting on the side of a flat bed without using bedrails?: A Little Help needed moving to and from a bed to a chair (including a wheelchair)?: A Little Help  needed standing up from a chair using your arms (e.g., wheelchair or bedside chair)?: A Little Help needed to walk in hospital room?: A Little Help needed climbing 3-5 steps with a railing? : A Lot 6 Click Score: 17    End of Session Equipment Utilized During Treatment: Gait belt Activity Tolerance: Patient limited by pain Patient left: with chair alarm set;in chair;with call bell/phone within reach Nurse Communication: Mobility status (O2) PT  Visit Diagnosis: Pain;Difficulty in walking, not elsewhere classified (R26.2);Muscle weakness (generalized) (M62.81) Pain - Right/Left: Right Pain - part of body: Hip    Time: 0825-0854 PT Time Calculation (min) (ACUTE ONLY): 29 min   Charges:   PT Evaluation $PT Eval Low Complexity: 1 Low PT Treatments $Therapeutic Exercise: 8-22 mins        Aleda Grana, PT, DPT 10/25/22, 9:07 AM   Sandi Mariscal 10/25/2022, 9:05 AM

## 2022-10-25 NOTE — Progress Notes (Signed)
Foley removed, continuous fluids stopped. Due to void by 1300.

## 2022-10-25 NOTE — Progress Notes (Signed)
Occupational Therapy Evaluation Patient Details Name: Teresa Patterson MRN: [id number] DOB: 05/07/1941 Today's Date: 10/25/2022   History of Present Illness Pt is an 82 y/o F admitted on 10/23/22 after presenting to the ED following a fall resulting in R hip pain & inability to bear weight. Pt found to have R acute hip femoral neck fx & underwent R direct anterior THA. PMH: hypothyroidism, migraines   Clinical Impression   Patient  reports living alone with her 50 yo cat and was Independent with ADLs and IADLs prior to fall and states that she is very active in her garden and enjoys being outside.  Patient reports that she has 3 daughters that can help her as needed.Patient completed toileting  transfer with min A using RW with verbal cues for overall sequencing and safety with BSC placed over commode for elevated surface.  Patient also completed oral hygiene standing at sink with min guard.  Patient educated on importance of functional mobility and ambulating to bathroom to increase overall strength, functional endurance and independence with functional mobility to increase independence with ADL tasks, Patient wold benefit from additional OT intervention to address functional deficits listed below.     Recommendations for follow up therapy are one component of a multi-disciplinary discharge planning process, led by the attending physician.  Recommendations may be updated based on patient status, additional functional criteria and insurance authorization.   Assistance Recommended at Discharge Intermittent Supervision/Assistance  Patient can return home with the following A lot of help with bathing/dressing/bathroom;Assist for transportation;Help with stairs or ramp for entrance;A little help with walking and/or transfers    Functional Status Assessment  Patient has had a recent decline in their functional status and demonstrates the ability to make significant improvements in function in a reasonable  and predictable amount of time.  Equipment Recommendations  BSC/3in1    Recommendations for Other Services       Precautions / Restrictions Precautions Precautions: Fall Restrictions Weight Bearing Restrictions: Yes RLE Weight Bearing: Weight bearing as tolerated      Mobility Bed Mobility Overal bed mobility: Needs Assistance                  Transfers Overall transfer level: Needs assistance Equipment used: Rolling walker (2 wheels) Transfers: Sit to/from Stand, Bed to chair/wheelchair/BSC Sit to Stand: Min assist     Step pivot transfers: Min assist     General transfer comment: chair to bathroom, verbal cues for RW safety and technique and reaching back for arm rests before sitting      Balance Overall balance assessment: Needs assistance Sitting-balance support: Feet supported   Sitting balance - Comments: supervision static sitting   Standing balance support: Bilateral upper extremity supported, During functional activity, Reliant on assistive device for balance                               ADL either performed or assessed with clinical judgement   ADL Overall ADL's : Needs assistance/impaired Eating/Feeding: Independent   Grooming: Oral care;Wash/dry face;Standing;Min guard   Upper Body Bathing: Set up   Lower Body Bathing: Moderate assistance   Upper Body Dressing : Set up;Sitting   Lower Body Dressing: Maximal assistance   Toilet Transfer: Minimal assistance;Cueing for safety;Cueing for sequencing (BSC over commode)   Toileting- Clothing Manipulation and Hygiene: Minimal assistance               Vision Patient Visual  Report: No change from baseline       Perception     Praxis      Pertinent Vitals/Pain Pain Assessment Pain Assessment: 0-10 Pain Score: 3  Pain Location: R hip with movement Pain Descriptors / Indicators: Discomfort, Grimacing, Guarding Pain Intervention(s): RN gave pain meds during session      Hand Dominance Right   Extremity/Trunk Assessment Upper Extremity Assessment Upper Extremity Assessment: Generalized weakness   Lower Extremity Assessment Lower Extremity Assessment: Defer to PT evaluation RLE Deficits / Details: 2/5 knee extension in sitting   Cervical / Trunk Assessment Cervical / Trunk Assessment: Normal   Communication Communication Communication: No difficulties   Cognition Arousal/Alertness: Awake/alert Behavior During Therapy: WFL for tasks assessed/performed Overall Cognitive Status: Within Functional Limits for tasks assessed                                       General Comments  Pt received on 3L/min, attempted to wean to room air but SpO2 reading as low as 83% after gait (question accuracy of reading & pt denying SOB). Pt placed back on 2L/min with improvement to 94%. Nurse made aware.    Exercises     Shoulder Instructions      Home Living Family/patient expects to be discharged to:: Private residence Living Arrangements: Alone Available Help at Discharge: Family;Available PRN/intermittently Type of Home: House Home Access: Stairs to enter Entrance Stairs-Number of Steps: 2 Entrance Stairs-Rails:  (has 2 handles (vs formal rails) she can hold to) Home Layout: Two level;Able to live on main level with bedroom/bathroom               Home Equipment: None;Shower seat;Grab bars - tub/shower          Prior Functioning/Environment Prior Level of Function : Independent/Modified Independent;Driving             Mobility Comments: Independent, walks 1 mile/day, has a pool she maintains, was power washing her porch when this happened. ADLs Comments: independent        OT Problem List: Decreased strength;Decreased activity tolerance;Impaired balance (sitting and/or standing);Decreased safety awareness;Decreased knowledge of use of DME or AE;Decreased knowledge of precautions      OT Treatment/Interventions:  Self-care/ADL training;Therapeutic exercise;Neuromuscular education;Therapeutic activities;Patient/family education    OT Goals(Current goals can be found in the care plan section) Acute Rehab OT Goals Patient Stated Goal: "to get better" OT Goal Formulation: With patient Time For Goal Achievement: 11/08/22 Potential to Achieve Goals: Good  OT Frequency: Min 2X/week    Co-evaluation              AM-PAC OT "6 Clicks" Daily Activity     Outcome Measure Help from another person eating meals?: None Help from another person taking care of personal grooming?: A Little Help from another person toileting, which includes using toliet, bedpan, or urinal?: A Little Help from another person bathing (including washing, rinsing, drying)?: A Lot Help from another person to put on and taking off regular upper body clothing?: None Help from another person to put on and taking off regular lower body clothing?: A Lot 6 Click Score: 18   End of Session Equipment Utilized During Treatment: Rolling walker (2 wheels) Nurse Communication: Mobility status;Patient requests pain meds  Activity Tolerance: Patient tolerated treatment well Patient left: in chair;with call bell/phone within reach;with chair alarm set  OT Visit Diagnosis: Unsteadiness on feet (R26.81);History  of falling (Z91.81);Muscle weakness (generalized) (M62.81)                Time: 1610-9604 OT Time Calculation (min): 25 min Charges:  OT Evaluation $OT Eval Moderate Complexity: 1 Mod OT Treatments $Self Care/Home Management : 8-22 mins Governor Specking OT/L  Denice Paradise 10/25/2022, 12:58 PM

## 2022-10-25 NOTE — Discharge Instructions (Signed)

## 2022-10-25 NOTE — Progress Notes (Signed)
Subjective: 1 Day Post-Op Procedure(s) (LRB): TOTAL HIP ARTHROPLASTY ANTERIOR APPROACH (Right) Patient reports pain as moderate.  Tolerated surgery well late yesterday.  Family at bedside.  Objective: Vital signs in last 24 hours: Temp:  [97.5 F (36.4 C)-98.3 F (36.8 C)] 97.9 F (36.6 C) (06/05 0341) Pulse Rate:  [58-68] 68 (06/05 0341) Resp:  [12-19] 16 (06/05 0341) BP: (104-148)/(57-77) 104/57 (06/05 0341) SpO2:  [92 %-100 %] 96 % (06/05 0341)  Intake/Output from previous day: 06/04 0701 - 06/05 0700 In: 2242.8 [P.O.:480; I.V.:1462.8; IV Piggyback:300] Out: 1850 [Urine:1550; Blood:300] Intake/Output this shift: No intake/output data recorded.  Recent Labs    10/23/22 2109 10/24/22 0113 10/25/22 0012  HGB 12.7 12.3 10.6*   Recent Labs    10/24/22 0113 10/25/22 0012  WBC 10.1 9.6  RBC 4.26 3.63*  HCT 37.5 33.0*  PLT 132* 115*   Recent Labs    10/24/22 0113 10/25/22 0012  NA 137 135  K 3.5 3.6  CL 102 102  CO2 26 26  BUN 13 12  CREATININE 0.81 0.70  GLUCOSE 121* 114*  CALCIUM 9.3 8.7*   Recent Labs    10/23/22 2109  INR 1.2    Sensation intact distally Intact pulses distally Dorsiflexion/Plantar flexion intact Incision: dressing C/D/I   Assessment/Plan: 1 Day Post-Op Procedure(s) (LRB): TOTAL HIP ARTHROPLASTY ANTERIOR APPROACH (Right) Up with therapy - WBAT right hip 81 mg aspirin twice daily      Kathryne Hitch 10/25/2022, 7:49 AM

## 2022-10-25 NOTE — Progress Notes (Signed)
PROGRESS NOTE  Teresa Patterson ZOX:[id number] DOB: March 01, 1941   PCP: Natalia Leatherwood, DO  Patient is from: Home  DOA: 10/23/2022 LOS: 2  Chief complaints Chief Complaint  Patient presents with   Hip Pain     Brief Narrative / Interim history: 82 year old F with PMH of HTN, hypothyroidism and migraine headache presenting with severe right hip pain and inability to bear weight after she tripped over a power washer hose and sustained a ground-level fall, and admitted for closed right hip fracture.  She underwent right total hip arthroplasty on 6/4.  PT/OT evaluation pending   Subjective: Seen and examined earlier this morning.  No major events overnight of this morning.  Reports significant pain with transfer.  Denies pain sitting on the chair.  Eating breakfast.  Denies chest pain, dyspnea, GI or UTI symptoms.  Objective: Vitals:   10/24/22 1950 10/25/22 0008 10/25/22 0341 10/25/22 0910  BP: 133/61 131/63 (!) 104/57 (!) 109/55  Pulse: 63 68 68 72  Resp:  16 16 16   Temp: 98.1 F (36.7 C) 98.3 F (36.8 C) 97.9 F (36.6 C) 98.2 F (36.8 C)  TempSrc: Oral Oral Oral Oral  SpO2: 100% 99% 96% 95%  Weight:      Height:        Examination:  GENERAL: No apparent distress.  Nontoxic. HEENT: MMM.  Vision and hearing grossly intact.  NECK: Supple.  No apparent JVD.  RESP:  No IWOB.  Fair aeration bilaterally. CVS:  RRR. Heart sounds normal.  ABD/GI/GU: BS+. Abd soft, NTND.  MSK/EXT: No apparent deformity. No edema.  SKIN: Dressing DCI. NEURO: Awake, alert and oriented appropriately.  No apparent focal neuro deficit. PSYCH: Calm. Normal affect.   Procedures:  6/4-prior total hip arthroplasty  Microbiology summarized: None  Assessment and plan: Principal Problem:   Closed right hip fracture, initial encounter [facility]) Active Problems:   Hypothyroidism   Hypokalemia  Accidental fall at home-tripped and fell.  No head trauma or LOC. Right Hip Fracture due to accidental fall  at home -S/p right hip total arthroplasty on 6/4. -Pain control and VTE prophylaxis per orthopedic surgery -PT/OT  Essential hypertension: Normotensive for most part. -Discontinue IV fluid and HCTZ   Hypothyroidism  -Continue Levothyroxine 100 mcg p.o. daily   Hypokalemia -Monitor replenish as appropriate  Normocytic anemia: Hgb down about 2 g.  Partly dilutional. Recent Labs    09/07/22 0913 10/23/22 2109 10/24/22 0113 10/25/22 0012  HGB 11.8* 12.7 12.3 10.6*  -Recheck in the morning -Discontinue IV fluid  Thrombocytopenia: Mild -Monitor closely   History of Migraines  -Continue to hold sumatriptan 5 mg per actuation spray 1 spray into another 2 hours as needed  Increased nutrient needs Body mass index is 23.08 kg/m. Nutrition Problem: Increased nutrient needs Etiology: hip fracture Signs/Symptoms: estimated needs Interventions: MVI   DVT prophylaxis:  SCDs Start: 10/24/22 2025 SCDs Start: 10/23/22 2330  Code Status: Full code Family Communication: None at bedside Level of care: Med-Surg Status is: Inpatient Remains inpatient appropriate because: Right hip fracture   Final disposition: SNF? Consultants:  Orthopedic surgery  35 minutes with more than 50% spent in reviewing records, counseling patient/family and coordinating care.   Sch Meds:  Scheduled Meds:  aspirin  81 mg Oral BID   Chlorhexidine Gluconate Cloth  6 each Topical Q0600   docusate sodium  100 mg Oral BID   fentaNYL (SUBLIMAZE) injection  25 mcg Intravenous Once   hydrochlorothiazide  25 mg Oral Daily  levothyroxine  100 mcg Oral Daily   multivitamin with minerals  1 tablet Oral Daily   mupirocin ointment  1 Application Nasal BID   oxybutynin  10 mg Oral Daily   pantoprazole  40 mg Oral Daily   Continuous Infusions:  methocarbamol (ROBAXIN) IV     PRN Meds:.acetaminophen, alum & mag hydroxide-simeth, diphenhydrAMINE, HYDROcodone-acetaminophen, HYDROmorphone (DILAUDID) injection,  menthol-cetylpyridinium **OR** phenol, methocarbamol **OR** methocarbamol (ROBAXIN) IV, metoCLOPramide **OR** metoCLOPramide (REGLAN) injection, ondansetron **OR** ondansetron (ZOFRAN) IV, oxyCODONE, oxyCODONE, polyethylene glycol  Antimicrobials: Anti-infectives (From admission, onward)    Start     Dose/Rate Route Frequency Ordered Stop   10/25/22 0600  ceFAZolin (ANCEF) IVPB 2g/100 mL premix        2 g 200 mL/hr over 30 Minutes Intravenous On call to O.R. 10/24/22 1447 10/24/22 1728   10/24/22 2300  ceFAZolin (ANCEF) IVPB 1 g/50 mL premix        1 g 100 mL/hr over 30 Minutes Intravenous Every 6 hours 10/24/22 2025 10/25/22 0637        I have personally reviewed the following labs and images: CBC: Recent Labs  Lab 10/23/22 2109 10/24/22 0113 10/25/22 0012  WBC 13.0* 10.1 9.6  NEUTROABS  --   --  8.0*  HGB 12.7 12.3 10.6*  HCT 38.1 37.5 33.0*  MCV 89.0 88.0 90.9  PLT 155 132* 115*   BMP &GFR Recent Labs  Lab 10/23/22 2109 10/24/22 0113 10/25/22 0012  NA 138 137 135  K 3.4* 3.5 3.6  CL 103 102 102  CO2 25 26 26   GLUCOSE 109* 121* 114*  BUN 11 13 12   CREATININE 0.81 0.81 0.70  CALCIUM 9.7 9.3 8.7*  MG  --  2.0 2.0  PHOS  --   --  2.6   Estimated Creatinine Clearance: 44.8 mL/min (by C-G formula based on SCr of 0.7 mg/dL). Liver & Pancreas: Recent Labs  Lab 10/23/22 2109 10/25/22 0012  AST 18 40  ALT 12 29  ALKPHOS 44 52  BILITOT 0.7 0.8  PROT 6.1* 5.0*  ALBUMIN 3.7 2.8*   No results for input(s): "LIPASE", "AMYLASE" in the last 168 hours. No results for input(s): "AMMONIA" in the last 168 hours. Diabetic: No results for input(s): "HGBA1C" in the last 72 hours. No results for input(s): "GLUCAP" in the last 168 hours. Cardiac Enzymes: No results for input(s): "CKTOTAL", "CKMB", "CKMBINDEX", "TROPONINI" in the last 168 hours. No results for input(s): "PROBNP" in the last 8760 hours. Coagulation Profile: Recent Labs  Lab 10/23/22 2109  INR 1.2    Thyroid Function Tests: No results for input(s): "TSH", "T4TOTAL", "FREET4", "T3FREE", "THYROIDAB" in the last 72 hours. Lipid Profile: No results for input(s): "CHOL", "HDL", "LDLCALC", "TRIG", "CHOLHDL", "LDLDIRECT" in the last 72 hours. Anemia Panel: No results for input(s): "VITAMINB12", "FOLATE", "FERRITIN", "TIBC", "IRON", "RETICCTPCT" in the last 72 hours. Urine analysis:    Component Value Date/Time   COLORURINE YELLOW 07/29/2019 0935   APPEARANCEUR CLEAR 07/29/2019 0935   LABSPEC 1.010 07/29/2019 0935   PHURINE 7.0 07/29/2019 0935   GLUCOSEU NEGATIVE 07/29/2019 0935   HGBUR NEGATIVE 07/29/2019 0935   HGBUR negative 06/16/2009 1003   BILIRUBINUR n 07/18/2017 1259   KETONESUR NEGATIVE 07/29/2019 0935   PROTEINUR NEGATIVE 07/29/2019 0935   UROBILINOGEN 0.2 07/18/2017 1259   UROBILINOGEN 0.2 06/16/2009 1003   NITRITE n 07/18/2017 1259   NITRITE negative 06/16/2009 1003   LEUKOCYTESUR Negative 07/18/2017 1259   Sepsis Labs: Invalid input(s): "PROCALCITONIN", "LACTICIDVEN"  Microbiology: Recent Results (  from the past 240 hour(s))  Surgical pcr screen     Status: Abnormal   Collection Time: 10/24/22 10:39 AM   Specimen: Nasal Mucosa; Nasal Swab  Result Value Ref Range Status   MRSA, PCR NEGATIVE NEGATIVE Final   Staphylococcus aureus POSITIVE (A) NEGATIVE Final    Comment: (NOTE) The Xpert SA Assay (FDA approved for NASAL specimens in patients 64 years of age and older), is one component of a comprehensive surveillance program. It is not intended to diagnose infection nor to guide or monitor treatment. Performed at [facility], [address], [place], [place] [address]     Radiology Studies: DG Pelvis Portable  Result Date: 10/24/2022 CLINICAL DATA:  Right hip replacement EXAM: PORTABLE PELVIS 1-2 VIEWS COMPARISON:  Right hip x-ray 10/23/2022 FINDINGS: There is a new right hip arthroplasty in anatomic alignment. No evidence for fracture. There is soft  tissue swelling and air surrounding the right hip with lateral skin staples compatible with recent surgery. IMPRESSION: Right hip arthroplasty in anatomic alignment. Electronically Signed   By: Darliss Cheney M.D.   On: 10/24/2022 20:17   DG HIP UNILAT WITH PELVIS 1V RIGHT  Result Date: 10/24/2022 CLINICAL DATA:  Elective surgery. EXAM: DG HIP (WITH OR WITHOUT PELVIS) 1V RIGHT COMPARISON:  Preoperative radiograph FINDINGS: Four fluoroscopic spot views of the pelvis and right hip obtained in the operating room. Sequential images during hip arthroplasty. Fluoroscopy time 18 seconds. Dose 1.47 mGy. IMPRESSION: Intraoperative fluoroscopy during right hip arthroplasty. Electronically Signed   By: Narda Rutherford M.D.   On: 10/24/2022 18:50   DG C-Arm 1-60 Min-No Report  Result Date: 10/24/2022 Fluoroscopy was utilized by the requesting physician.  No radiographic interpretation.      Monique Gift T. Saben Donigan [facility]  If 7PM-7AM, please contact night-coverage [email or web address] 10/25/2022, 11:34 AM

## 2022-10-25 NOTE — Plan of Care (Signed)
  Problem: Education: Goal: Knowledge of General Education information will improve Description: Including pain rating scale, medication(s)/side effects and non-pharmacologic comfort measures Outcome: Progressing   Problem: Clinical Measurements: Goal: Ability to maintain clinical measurements within normal limits will improve Outcome: Progressing   Problem: Activity: Goal: Risk for activity intolerance will decrease Outcome: Progressing   Problem: Elimination: Goal: Will not experience complications related to bowel motility Outcome: Progressing   Problem: Activity: Goal: Ability to avoid complications of mobility impairment will improve Outcome: Progressing   Problem: Pain Management: Goal: Pain level will decrease with appropriate interventions Outcome: Progressing   Problem: Skin Integrity: Goal: Will show signs of wound healing Outcome: Progressing

## 2022-10-26 DIAGNOSIS — W19XXXA Unspecified fall, initial encounter: Secondary | ICD-10-CM | POA: Diagnosis not present

## 2022-10-26 DIAGNOSIS — S72001A Fracture of unspecified part of neck of right femur, initial encounter for closed fracture: Secondary | ICD-10-CM | POA: Diagnosis not present

## 2022-10-26 DIAGNOSIS — E876 Hypokalemia: Secondary | ICD-10-CM | POA: Diagnosis not present

## 2022-10-26 DIAGNOSIS — E034 Atrophy of thyroid (acquired): Secondary | ICD-10-CM | POA: Diagnosis not present

## 2022-10-26 LAB — RETICULOCYTES
Immature Retic Fract: 12.2 % (ref 2.3–15.9)
RBC.: 3.58 MIL/uL — ABNORMAL LOW (ref 3.87–5.11)
Retic Count, Absolute: 58.4 10*3/uL (ref 19.0–186.0)
Retic Ct Pct: 1.6 % (ref 0.4–3.1)

## 2022-10-26 LAB — RENAL FUNCTION PANEL
Albumin: 2.8 g/dL — ABNORMAL LOW (ref 3.5–5.0)
Albumin: 3 g/dL — ABNORMAL LOW (ref 3.5–5.0)
Anion gap: 8 (ref 5–15)
Anion gap: 7 (ref 5–15)
BUN: 9 mg/dL (ref 8–23)
BUN: 5 mg/dL — ABNORMAL LOW (ref 8–23)
CO2: 27 mmol/L (ref 22–32)
CO2: 28 mmol/L (ref 22–32)
Calcium: 8.9 mg/dL (ref 8.9–10.3)
Calcium: 9.1 mg/dL (ref 8.9–10.3)
Chloride: 96 mmol/L — ABNORMAL LOW (ref 98–111)
Chloride: 100 mmol/L (ref 98–111)
Creatinine, Ser: 0.75 mg/dL (ref 0.44–1.00)
Creatinine, Ser: 0.71 mg/dL (ref 0.44–1.00)
GFR, Estimated: >60 mL/min (ref >60)
GFR, Estimated: >60 mL/min (ref >60)
Glucose, Bld: 139 mg/dL — ABNORMAL HIGH (ref 70–99)
Glucose, Bld: 112 mg/dL — ABNORMAL HIGH (ref 70–99)
Phosphorus: 1.8 mg/dL — ABNORMAL LOW (ref 2.5–4.6)
Phosphorus: 2.2 mg/dL — ABNORMAL LOW (ref 2.5–4.6)
Potassium: 3.2 mmol/L — ABNORMAL LOW (ref 3.5–5.1)
Potassium: 3.9 mmol/L (ref 3.5–5.1)
Sodium: 131 mmol/L — ABNORMAL LOW (ref 135–145)
Sodium: 135 mmol/L (ref 135–145)

## 2022-10-26 LAB — FOLATE: Folate: 18.9 ng/mL (ref >5.9)

## 2022-10-26 LAB — CBC
HCT: 32.2 % — ABNORMAL LOW (ref 36.0–46.0)
Hemoglobin: 10.6 g/dL — ABNORMAL LOW (ref 12.0–15.0)
MCH: 29.4 pg (ref 26.0–34.0)
MCHC: 32.9 g/dL (ref 30.0–36.0)
MCV: 89.2 fL (ref 80.0–100.0)
Platelets: 114 10*3/uL — ABNORMAL LOW (ref 150–400)
RBC: 3.61 MIL/uL — ABNORMAL LOW (ref 3.87–5.11)
RDW: 13.7 % (ref 11.5–15.5)
WBC: 11.9 10*3/uL — ABNORMAL HIGH (ref 4.0–10.5)
nRBC: 0 % (ref 0.0–0.2)

## 2022-10-26 LAB — VITAMIN B12: Vitamin B-12: 293 pg/mL (ref 180–914)

## 2022-10-26 LAB — FERRITIN: Ferritin: 77 ng/mL (ref 11–307)

## 2022-10-26 LAB — IRON AND TIBC
Iron: 8 ug/dL — ABNORMAL LOW (ref 28–170)
Saturation Ratios: 3 % — ABNORMAL LOW (ref 10.4–31.8)
TIBC: 302 ug/dL (ref 250–450)
UIBC: 294 ug/dL

## 2022-10-26 LAB — MAGNESIUM: Magnesium: 1.8 mg/dL (ref 1.7–2.4)

## 2022-10-26 MED ORDER — SENNOSIDES-DOCUSATE SODIUM 8.6-50 MG PO TABS
1.0000 | ORAL_TABLET | Freq: Two times a day (BID) | ORAL | Status: DC
  Administered 2022-10-26 – 2022-10-27 (×3): 1 via ORAL
  Filled 2022-10-26 (×3): qty 1

## 2022-10-26 MED ORDER — VITAMIN B-12 1000 MCG PO TABS
1000.0000 ug | ORAL_TABLET | Freq: Every day | ORAL | Status: DC
  Administered 2022-10-26 – 2022-10-27 (×2): 1000 ug via ORAL
  Filled 2022-10-26 (×3): qty 1

## 2022-10-26 MED ORDER — POTASSIUM CHLORIDE CRYS ER 20 MEQ PO TBCR
40.0000 meq | EXTENDED_RELEASE_TABLET | Freq: Once | ORAL | Status: AC
  Administered 2022-10-26: 40 meq via ORAL
  Filled 2022-10-26: qty 2

## 2022-10-26 MED ORDER — POTASSIUM PHOSPHATES 15 MMOLE/5ML IV SOLN
30.0000 mmol | Freq: Once | INTRAVENOUS | Status: AC
  Administered 2022-10-26: 30 mmol via INTRAVENOUS
  Filled 2022-10-26: qty 10

## 2022-10-26 MED ORDER — SODIUM CHLORIDE 0.9 % IV SOLN
250.0000 mg | Freq: Every day | INTRAVENOUS | Status: AC
  Administered 2022-10-26 – 2022-10-27 (×2): 250 mg via INTRAVENOUS
  Filled 2022-10-26 (×2): qty 20

## 2022-10-26 NOTE — Progress Notes (Signed)
PROGRESS NOTE  Teresa Patterson ZOX:[id number] DOB: 05/06/1941   PCP: Natalia Leatherwood, DO  Patient is from: Home  DOA: 10/23/2022 LOS: 3  Chief complaints Chief Complaint  Patient presents with   Hip Pain     Brief Narrative / Interim history: 82 year old F with PMH of HTN, hypothyroidism and migraine headache presenting with severe right hip pain and inability to bear weight after she tripped over a power washer hose and sustained a ground-level fall, and admitted for closed right hip fracture.  She underwent right total hip arthroplasty on 6/4.  Doing well.  Likely discharge home with home health and DME on 6/7 if electrolytes stable.   Subjective: Seen and examined earlier this morning.  No major events overnight of this morning.  Pain well-controlled.  Ambulated with therapy yesterday.  Prefers to go home with home health if possible.  Objective: Vitals:   10/25/22 1609 10/25/22 2039 10/26/22 0454 10/26/22 0949  BP: 116/64 (!) 119/54 119/67 128/63  Pulse: 77 84 80 73  Resp: 16 16  16   Temp: 98 F (36.7 C) 98.2 F (36.8 C) 98.1 F (36.7 C) 98 F (36.7 C)  TempSrc: Oral Oral Oral Oral  SpO2: 91% 95% (P) 91% 96%  Weight:      Height:        Examination:  GENERAL: No apparent distress.  Nontoxic. HEENT: MMM.  Vision and hearing grossly intact.  NECK: Supple.  No apparent JVD.  RESP:  No IWOB.  Fair aeration bilaterally. CVS:  RRR. Heart sounds normal.  ABD/GI/GU: BS+. Abd soft, NTND.  MSK/EXT:   No apparent deformity. Moves extremities. No edema.  SKIN: Dressing over right thigh DCI. NEURO: Awake and alert. Oriented appropriately.  No apparent focal neuro deficit. PSYCH: Calm. Normal affect.   Procedures:  6/4-prior total hip arthroplasty  Microbiology summarized: None  Assessment and plan: Principal Problem:   Closed right hip fracture, initial encounter [facility]) Active Problems:   Hypothyroidism   Hypokalemia  Accidental fall at home-tripped and fell.  No  head trauma or LOC. Right Hip Fracture due to accidental fall at home -S/p right hip total arthroplasty on 6/4. -Pain control and VTE prophylaxis per orthopedic surgery -Continue PT/OT  Essential hypertension: Normotensive for most part. -Discontinued HCTZ on 6/5.   Hypothyroidism  -Continue Levothyroxine 100 mcg p.o. daily   Hypokalemia/hyponatremia/hypophosphatemia: Likely due to HCTZ. -HCTZ discontinued on 6/5 -P.o. KCl 40 x 1 -IV potassium phosphate 30 mmol x 1 -Recheck in the evening  Normocytic iron deficiency anemia: Hgb down about 2 g but is stable now.  Anemia panel suggests iron deficiency.  B12 293. Recent Labs    09/07/22 0913 10/23/22 2109 10/24/22 0113 10/25/22 0012 10/26/22 0048  HGB 11.8* 12.7 12.3 10.6* 10.6*  -IV ferric gluconate 250 mg daily x 2 -P.o. vitamin B12 daily -Recheck H&H in the morning  Thrombocytopenia: Mild and stable -Monitor    History of Migraines  -Continue to hold sumatriptan 5 mg per actuation spray 1 spray into another 2 hours as needed  Increased nutrient needs Body mass index is 23.08 kg/m. Nutrition Problem: Increased nutrient needs Etiology: hip fracture Signs/Symptoms: estimated needs Interventions: MVI   DVT prophylaxis:  SCDs Start: 10/24/22 2025 SCDs Start: 10/23/22 2330  Code Status: Full code Family Communication: None at bedside Level of care: Med-Surg Status is: Inpatient Remains inpatient appropriate because: Right hip fracture and electrolyte derangement   Final disposition: Home on 6/7 Consultants:  Orthopedic surgery  35 minutes with  more than 50% spent in reviewing records, counseling patient/family and coordinating care.   Sch Meds:  Scheduled Meds:  aspirin  81 mg Oral BID   Chlorhexidine Gluconate Cloth  6 each Topical Q0600   vitamin B-12  1,000 mcg Oral Daily   fentaNYL (SUBLIMAZE) injection  25 mcg Intravenous Once   levothyroxine  100 mcg Oral Daily   multivitamin with minerals  1  tablet Oral Daily   mupirocin ointment  1 Application Nasal BID   oxybutynin  10 mg Oral Daily   pantoprazole  40 mg Oral Daily   senna-docusate  1 tablet Oral BID   Continuous Infusions:  ferric gluconate (FERRLECIT) IVPB     methocarbamol (ROBAXIN) IV     potassium PHOSPHATE IVPB (in mmol) 30 mmol (10/26/22 0859)   PRN Meds:.acetaminophen, alum & mag hydroxide-simeth, diphenhydrAMINE, HYDROcodone-acetaminophen, HYDROmorphone (DILAUDID) injection, menthol-cetylpyridinium **OR** phenol, methocarbamol **OR** methocarbamol (ROBAXIN) IV, metoCLOPramide **OR** metoCLOPramide (REGLAN) injection, ondansetron **OR** ondansetron (ZOFRAN) IV, oxyCODONE, oxyCODONE, polyethylene glycol  Antimicrobials: Anti-infectives (From admission, onward)    Start     Dose/Rate Route Frequency Ordered Stop   10/25/22 0600  ceFAZolin (ANCEF) IVPB 2g/100 mL premix        2 g 200 mL/hr over 30 Minutes Intravenous On call to O.R. 10/24/22 1447 10/24/22 1728   10/24/22 2300  ceFAZolin (ANCEF) IVPB 1 g/50 mL premix        1 g 100 mL/hr over 30 Minutes Intravenous Every 6 hours 10/24/22 2025 10/25/22 0637        I have personally reviewed the following labs and images: CBC: Recent Labs  Lab 10/23/22 2109 10/24/22 0113 10/25/22 0012 10/26/22 0048  WBC 13.0* 10.1 9.6 11.9*  NEUTROABS  --   --  8.0*  --   HGB 12.7 12.3 10.6* 10.6*  HCT 38.1 37.5 33.0* 32.2*  MCV 89.0 88.0 90.9 89.2  PLT 155 132* 115* 114*   BMP &GFR Recent Labs  Lab 10/23/22 2109 10/24/22 0113 10/25/22 0012 10/26/22 0048  NA 138 137 135 131*  K 3.4* 3.5 3.6 3.2*  CL 103 102 102 96*  CO2 25 26 26 27   GLUCOSE 109* 121* 114* 139*  BUN 11 13 12 9   CREATININE 0.81 0.81 0.70 0.75  CALCIUM 9.7 9.3 8.7* 8.9  MG  --  2.0 2.0 1.8  PHOS  --   --  2.6 1.8*   Estimated Creatinine Clearance: 44.8 mL/min (by C-G formula based on SCr of 0.75 mg/dL). Liver & Pancreas: Recent Labs  Lab 10/23/22 2109 10/25/22 0012 10/26/22 0048  AST  18 40  --   ALT 12 29  --   ALKPHOS 44 52  --   BILITOT 0.7 0.8  --   PROT 6.1* 5.0*  --   ALBUMIN 3.7 2.8* 2.8*   No results for input(s): "LIPASE", "AMYLASE" in the last 168 hours. No results for input(s): "AMMONIA" in the last 168 hours. Diabetic: No results for input(s): "HGBA1C" in the last 72 hours. No results for input(s): "GLUCAP" in the last 168 hours. Cardiac Enzymes: No results for input(s): "CKTOTAL", "CKMB", "CKMBINDEX", "TROPONINI" in the last 168 hours. No results for input(s): "PROBNP" in the last 8760 hours. Coagulation Profile: Recent Labs  Lab 10/23/22 2109  INR 1.2   Thyroid Function Tests: No results for input(s): "TSH", "T4TOTAL", "FREET4", "T3FREE", "THYROIDAB" in the last 72 hours. Lipid Profile: No results for input(s): "CHOL", "HDL", "LDLCALC", "TRIG", "CHOLHDL", "LDLDIRECT" in the last 72 hours. Anemia Panel: Recent  Labs    10/26/22 0048  VITAMINB12 293  FOLATE 18.9  FERRITIN 77  TIBC 302  IRON 8*  RETICCTPCT 1.6   Urine analysis:    Component Value Date/Time   COLORURINE YELLOW 07/29/2019 0935   APPEARANCEUR CLEAR 07/29/2019 0935   LABSPEC 1.010 07/29/2019 0935   PHURINE 7.0 07/29/2019 0935   GLUCOSEU NEGATIVE 07/29/2019 0935   HGBUR NEGATIVE 07/29/2019 0935   HGBUR negative 06/16/2009 1003   BILIRUBINUR n 07/18/2017 1259   KETONESUR NEGATIVE 07/29/2019 0935   PROTEINUR NEGATIVE 07/29/2019 0935   UROBILINOGEN 0.2 07/18/2017 1259   UROBILINOGEN 0.2 06/16/2009 1003   NITRITE n 07/18/2017 1259   NITRITE negative 06/16/2009 1003   LEUKOCYTESUR Negative 07/18/2017 1259   Sepsis Labs: Invalid input(s): "PROCALCITONIN", "LACTICIDVEN"  Microbiology: Recent Results (from the past 240 hour(s))  Surgical pcr screen     Status: Abnormal   Collection Time: 10/24/22 10:39 AM   Specimen: Nasal Mucosa; Nasal Swab  Result Value Ref Range Status   MRSA, PCR NEGATIVE NEGATIVE Final   Staphylococcus aureus POSITIVE (A) NEGATIVE Final     Comment: (NOTE) The Xpert SA Assay (FDA approved for NASAL specimens in patients 24 years of age and older), is one component of a comprehensive surveillance program. It is not intended to diagnose infection nor to guide or monitor treatment. Performed at [facility], [address] [place], [place] [address]     Radiology Studies: No results found.    Teresa Patterson [facility]  If 7PM-7AM, please contact night-coverage [email or web address] 10/26/2022, 12:02 PM

## 2022-10-26 NOTE — TOC Initial Note (Signed)
Transition of Care (TOC) - Initial/Assessment Note   Spoke to patient at bedside. Confirmed face sheet information. Patient from home alone. However has three daughters will take turns staying with her and friend will also assist. Patient will not have 24/7 assist at home .   PT/OT recommending bedside commode and walker. Patient agreeable to both. She has shower chair at home. Ordered bedside commode ( 3 in 1 ) and walker with Jermaine with [employer].   Tresa Endo with [facility] accepted referral for HHPT  Patient Details  Name: Teresa Patterson MRN: [id number] Date of Birth: [birth year]  Transition of Care [facility]) CM/SW Contact:    Kingsley Plan, RN Phone Number: 10/26/2022, 12:48 PM  Clinical Narrative:                   Expected Discharge Plan: Home w Home Health Services Barriers to Discharge: Continued Medical Work up   Patient Goals and CMS Choice Patient states their goals for this hospitalization and ongoing recovery are:: to return to home CMS Medicare.gov Compare Post Acute Care list provided to:: Patient Choice offered to / list presented to : Patient [place] ownership interest in [facility].provided to:: Patient    Expected Discharge Plan and Services   Discharge Planning Services: CM Consult Post Acute Care Choice: Durable Medical Equipment, Home Health Living arrangements for the past 2 months: Single Family Home                 DME Arranged: 3-N-1, Walker rolling DME Agency: [employer] Date DME Agency Contacted: 10/26/22 Time DME Agency Contacted: 1247 Representative spoke with at DME Agency: Vaughan Basta HH Arranged: PT HH Agency: [facility] Date [facility] Agency Contacted: 10/26/22 Time HH Agency Contacted: 1247 Representative spoke with at [facility] Agency: Tresa Endo  Prior Living Arrangements/Services Living arrangements for the past 2 months: Single Family Home Lives with:: Self Patient language and need for interpreter reviewed:: Yes Do you feel  safe going back to the place where you live?: Yes      Need for Family Participation in Patient Care: Yes (Comment) Care giver support system in place?: Yes (comment)   Criminal Activity/Legal Involvement Pertinent to Current Situation/Hospitalization: No - Comment as needed  Activities of Daily Living Home Assistive Devices/Equipment: None ADL Screening (condition at time of admission) Patient's cognitive ability adequate to safely complete daily activities?: Yes Is the patient deaf or have difficulty hearing?: No Does the patient have difficulty seeing, even when wearing glasses/contacts?: No Does the patient have difficulty concentrating, remembering, or making decisions?: No Patient able to express need for assistance with ADLs?: No Does the patient have difficulty dressing or bathing?: Yes Independently performs ADLs?: No Dressing (OT): Needs assistance Is this a change from baseline?: Change from baseline, expected to last <3days Grooming: Needs assistance Is this a change from baseline?: Change from baseline, expected to last <3 days Feeding: Independent Bathing: Needs assistance Is this a change from baseline?: Change from baseline, expected to last <3 days Toileting: Needs assistance Is this a change from baseline?: Change from baseline, expected to last <3 days In/Out Bed: Needs assistance Is this a change from baseline?: Change from baseline, expected to last <3 days Does the patient have difficulty walking or climbing stairs?: Yes Weakness of Legs: Right Weakness of Arms/Hands: Both  Permission Sought/Granted   Permission granted to share information with : Yes, Verbal Permission Granted  Share Information with NAME: Friend at bedside  Emotional Assessment Appearance:: Appears stated age Attitude/Demeanor/Rapport: Engaged Affect (typically observed): Accepting Orientation: : Oriented to Self, Oriented to Place, Oriented to  Time, Oriented to  Situation Alcohol / Substance Use: Not Applicable Psych Involvement: No (comment)  Admission diagnosis:  Closed fracture of neck of right femur, initial encounter (HCC) [S72.001A] Closed right hip fracture, initial encounter [facility]) [S72.001A] Patient Active Problem List   Diagnosis Date Noted   Closed right hip fracture, initial encounter (HCC) 10/23/2022   Hypokalemia 10/23/2022   Migraine, chronic, without aura 09/05/2021   Osteopenia 11/06/2019   Edema 07/25/2018   Seasonal allergies 07/25/2018   Hypothyroidism 07/11/2016   Prominent aorta 07/11/2016   Primary osteoarthritis of first carpometacarpal joint of right hand 11/25/2015   Menopausal symptom 06/04/2007   PCP:  Natalia Leatherwood, DO Pharmacy:   [facility] [address] [place] - [address] [place] [address] Phone: [phone] Fax: [phone]     Social Determinants of Health (SDOH) Social History: SDOH Screenings   Food Insecurity: No Food Insecurity (01/25/2022)  Housing: Low Risk  (01/25/2022)  Transportation Needs: No Transportation Needs (01/25/2022)  Utilities: Not At Risk (01/25/2022)  Alcohol Screen: Low Risk  (01/25/2022)  Depression (PHQ2-9): Low Risk  (09/07/2022)  Financial Resource Strain: Low Risk  (01/25/2022)  Physical Activity: Insufficiently Active (01/25/2022)  Social Connections: Socially Isolated (01/25/2022)  Stress: No Stress Concern Present (01/25/2022)  Tobacco Use: Low Risk  (10/25/2022)   SDOH Interventions:     Readmission Risk Interventions     No data to display

## 2022-10-26 NOTE — Care Management Important Message (Signed)
Important Message  Patient Details  Name: CHARESE WERGIN MRN: [id number] Date of Birth: [birth year]   Medicare Important Message Given:  Yes     Sherilyn Banker 10/26/2022, 12:36 PM

## 2022-10-26 NOTE — Progress Notes (Signed)
Patient ID: Teresa Patterson, female   DOB: May 23, 1940, 82 y.o.   MRN: [id number] The patient is awake and alert this morning.  Her vital signs are stable.  Her H&H is also stable.  She did get up with OT and PT yesterday.  She fortunately has excellent family support at home.  She does report some mild hip pain with just laying there and more moderate pain when she is ambulating.  Her right hip dressing looks good overall.  She will continue to benefit from therapy here in the hospital and I believe there is a good chance that she will be able to go home in the next day or 2 depending on her progress and mobility and clearance by therapy.

## 2022-10-26 NOTE — Progress Notes (Signed)
Physical Therapy Treatment Patient Details Name: Teresa Patterson MRN: [id number] DOB: Sep 17, 1940 Today's Date: 10/26/2022   History of Present Illness 82 y/o F admitted on 10/23/22 after presenting to the ED following a fall resulting in R hip pain & inability to bear weight. Pt found to have R acute hip femoral neck fx & underwent R direct anterior THA. PMH: hypothyroidism, migraines    PT Comments    Pt was seen for therapy session in which pt was assisted OOB and to BR, then to walk down the hall a short trip.  This progression was helpful as pt is fairly able to move RLE with min assist, and noted her plan to go home was reasonable with help from her family.  Pt has a daughter in attendance and note her ability to  move is fairly good, has control of R hip for balancing on walker.  Recommend her to HHPT and for pt to persistently work on strength and control of moving R hip.   Recommendations for follow up therapy are one component of a multi-disciplinary discharge planning process, led by the attending physician.  Recommendations may be updated based on patient status, additional functional criteria and insurance authorization.  Follow Up Recommendations       Assistance Recommended at Discharge Intermittent Supervision/Assistance  Patient can return home with the following A little help with walking and/or transfers;A little help with bathing/dressing/bathroom;Assistance with cooking/housework;Assist for transportation;Help with stairs or ramp for entrance   Equipment Recommendations  Rolling walker (2 wheels);BSC/3in1    Recommendations for Other Services       Precautions / Restrictions Restrictions RLE Weight Bearing: Weight bearing as tolerated     Mobility  Bed Mobility Overal bed mobility: Needs Assistance Bed Mobility: Supine to Sit, Sit to Supine     Supine to sit: Min assist Sit to supine: Min assist   General bed mobility comments: specific cues for body mechanics  given and pt responds well to minimize help needed, assisted mainly RLE    Transfers Overall transfer level: Needs assistance Equipment used: Rolling walker (2 wheels) Transfers: Sit to/from Stand Sit to Stand: Min assist           General transfer comment: assisted to stand from BR with wall bar and same with bed and bedrail    Ambulation/Gait Ambulation/Gait assistance: Min assist Gait Distance (Feet): 130 Feet Assistive device: Rolling walker (2 wheels) Gait Pattern/deviations: Decreased stride length, Decreased dorsiflexion - right, Decreased dorsiflexion - left Gait velocity: decreased Gait velocity interpretation: <1.31 ft/sec, indicative of household ambulator Pre-gait activities: standing balance ck General Gait Details: approaches shuffling pattern but not sliding feet   Stairs             Wheelchair Mobility    Modified Rankin (Stroke Patients Only)       Balance Overall balance assessment: Needs assistance Sitting-balance support: Feet supported Sitting balance-Leahy Scale: Good     Standing balance support: Bilateral upper extremity supported, During functional activity Standing balance-Leahy Scale: Fair Standing balance comment: less than fair dynamically                            Cognition Arousal/Alertness: Awake/alert Behavior During Therapy: WFL for tasks assessed/performed Overall Cognitive Status: Within Functional Limits for tasks assessed  General Comments: attended by daughter who is also a caregiver        Exercises      General Comments General comments (skin integrity, edema, etc.): Pt was assisted to walk and get to BR, needing help to manage the drying process      Pertinent Vitals/Pain Pain Assessment Pain Assessment: Faces Faces Pain Scale: Hurts little more Pain Location: R hip to get into and out of bed Pain Descriptors / Indicators: Discomfort, Grimacing,  Guarding Pain Intervention(s): Limited activity within patient's tolerance, Monitored during session, Premedicated before session, Repositioned    Home Living                          Prior Function            PT Goals (current goals can now be found in the care plan section) Progress towards PT goals: Progressing toward goals    Frequency    7X/week      PT Plan Current plan remains appropriate    Co-evaluation              AM-PAC PT "6 Clicks" Mobility   Outcome Measure  Help needed turning from your back to your side while in a flat bed without using bedrails?: A Little Help needed moving from lying on your back to sitting on the side of a flat bed without using bedrails?: A Little Help needed moving to and from a bed to a chair (including a wheelchair)?: A Little Help needed standing up from a chair using your arms (e.g., wheelchair or bedside chair)?: A Little Help needed to walk in hospital room?: A Little Help needed climbing 3-5 steps with a railing? : A Lot 6 Click Score: 17    End of Session Equipment Utilized During Treatment: Gait belt Activity Tolerance: Patient limited by pain Patient left: in bed;with bed alarm set;with nursing/sitter in room;with call bell/phone within reach Nurse Communication: Mobility status PT Visit Diagnosis: Pain;Difficulty in walking, not elsewhere classified (R26.2);Muscle weakness (generalized) (M62.81) Pain - Right/Left: Right Pain - part of body: Hip     Time: 1610-9604 PT Time Calculation (min) (ACUTE ONLY): 29 min  Charges:  $Gait Training: 8-22 mins $Therapeutic Activity: 8-22 mins        Ivar Drape 10/26/2022, 6:29 PM  Samul Dada, PT PhD Acute Rehab Dept. Number: [facility] [id number] and [facility] [phone]

## 2022-10-27 ENCOUNTER — Other Ambulatory Visit: Payer: Self-pay

## 2022-10-27 DIAGNOSIS — E034 Atrophy of thyroid (acquired): Secondary | ICD-10-CM | POA: Diagnosis not present

## 2022-10-27 DIAGNOSIS — E876 Hypokalemia: Secondary | ICD-10-CM | POA: Diagnosis not present

## 2022-10-27 DIAGNOSIS — S72001A Fracture of unspecified part of neck of right femur, initial encounter for closed fracture: Secondary | ICD-10-CM | POA: Diagnosis not present

## 2022-10-27 LAB — CBC
HCT: 31 % — ABNORMAL LOW (ref 36.0–46.0)
Hemoglobin: 10.2 g/dL — ABNORMAL LOW (ref 12.0–15.0)
MCH: 29.7 pg (ref 26.0–34.0)
MCHC: 32.9 g/dL (ref 30.0–36.0)
MCV: 90.4 fL (ref 80.0–100.0)
Platelets: 129 10*3/uL — ABNORMAL LOW (ref 150–400)
RBC: 3.43 MIL/uL — ABNORMAL LOW (ref 3.87–5.11)
RDW: 14.3 % (ref 11.5–15.5)
WBC: 9.8 10*3/uL (ref 4.0–10.5)
nRBC: 0 % (ref 0.0–0.2)

## 2022-10-27 MED ORDER — ACETAMINOPHEN 500 MG PO TABS: ORAL_TABLET | ORAL | 0 refills | Status: AC

## 2022-10-27 MED ORDER — OXYCODONE HCL 5 MG PO TABS: 5.0000 mg | ORAL_TABLET | Freq: Four times a day (QID) | ORAL | 0 refills | Status: DC | PRN

## 2022-10-27 MED ORDER — SENNOSIDES-DOCUSATE SODIUM 8.6-50 MG PO TABS: 1.0000 | ORAL_TABLET | Freq: Two times a day (BID) | ORAL | Status: DC | PRN

## 2022-10-27 MED ORDER — CYANOCOBALAMIN 1000 MCG PO TABS: 1000.0000 ug | ORAL_TABLET | Freq: Every day | ORAL | Status: AC

## 2022-10-27 MED ORDER — ASPIRIN 81 MG PO CHEW: 81.0000 mg | CHEWABLE_TABLET | Freq: Two times a day (BID) | ORAL | 0 refills | Status: AC

## 2022-10-27 NOTE — Discharge Summary (Signed)
Physician Discharge Summary  Teresa Patterson XWR:[id number] DOB: [birth year] DOA: 10/23/2022  PCP: Natalia Leatherwood, DO  Admit date: 10/23/2022 Discharge date: 10/27/2022 Admitted From: Home Disposition: Home Recommendations for Outpatient Follow-up:  Follow up with PCP and orthopedic surgery as below Check BP, CMP and CBC at follow-up Please follow up on the following pending results: None  Home Health: PT Equipment/Devices: Rolling walker and 3 in 1 commode  Discharge Condition: Stable CODE STATUS: Full code  Follow-up Information     Kathryne Hitch, MD. Schedule an appointment as soon as possible for a visit in 2 week(s).   Specialty: Orthopedic Surgery Contact information: [address] [place] [address] [phone]         [facility] Follow up.   Specialty: [facility] Contact information: [address] [place] [address] [phone]         Felix Pacini A, DO. Schedule an appointment as soon as possible for a visit in 1 week(s).   Specialty: Family Medicine Contact information: [address] [place] [address] [phone]                 Hospital course 82 year old F with PMH of HTN, hypothyroidism and migraine headache presenting with severe right hip pain and inability to bear weight after she tripped over a power washer hose and sustained a ground-level fall, and admitted for closed right hip fracture.  She underwent right total hip arthroplasty on 6/4.  No postoperative complication other than some electrolyte arrangement with hyponatremia and hypokalemia.  On the day of discharge, electrolyte derangements resolved.  Cleared for discharge home with home health and DME by orthopedic surgery and therapy.  Discontinued HCTZ on discharge.   See individual problem list below for more.   Problems addressed during this hospitalization Principal Problem:   Closed right hip fracture, initial encounter  [facility]) Active Problems:   Hypothyroidism   Hypokalemia   Accidental fall at home-tripped and fell.  No head trauma or LOC. Right Hip Fracture due to accidental fall at home -S/p right hip total arthroplasty on 6/4. -Pain control and VTE prophylaxis per orthopedic surgery -HH PT, rolling walker and 3 in 1 commode -Outpatient follow-up with orthopedic surgery in 2 weeks   Essential hypertension: Normotensive without HCTZ. -Discontinued HCTZ on 6/5 given electrolyte arrangement.  Blood pressure within normal.   Hypothyroidism  -Continue Levothyroxine 100 mcg p.o. daily   Hypokalemia/hyponatremia/hypophosphatemia: Likely due to HCTZ.  Resolved. -HCTZ discontinued.   Normocytic iron deficiency anemia: Hgb down about 2 g but is stable now.  Anemia panel suggests iron deficiency.  B12 293. -Received IV ferric gluconate 250 mg daily x 2 -P.o. vitamin B12 daily -Recheck CBC in 1 week   Thrombocytopenia: Mild and improved. -Recheck CBC in 1 week    History of Migraines  -Continue to hold sumatriptan 5 mg per actuation spray 1 spray into another 2 hours as needed   Increased nutrient needs Nutrition Problem: Increased nutrient needs Etiology: hip fracture Signs/Symptoms: estimated needs Interventions: MVI     Time spent 35 minutes  Vital signs Vitals:   10/26/22 1959 10/26/22 2118 10/27/22 0601 10/27/22 1009  BP: 124/68  116/61 113/65  Pulse: 86  73 82  Temp: (!) 100.5 F (38.1 C) 99.4 F (37.4 C) 98.8 F (37.1 C) 99.6 F (37.6 C)  Resp: 17  17 17   Height:      Weight:      SpO2: 97%  92% 91%  TempSrc: Oral Oral Oral Oral  BMI (Calculated):         Discharge exam  GENERAL: No apparent distress.  Nontoxic. HEENT: MMM.  Vision and hearing grossly intact.  NECK: Supple.  No apparent JVD.  RESP:  No IWOB.  Fair aeration bilaterally. CVS:  RRR. Heart sounds normal.  ABD/GI/GU: BS+. Abd soft, NTND.  MSK/EXT:  Moves extremities. No apparent deformity. No edema.   SKIN: Dressing over right thigh DCI. NEURO: Awake and alert. Oriented appropriately.  No apparent focal neuro deficit. PSYCH: Calm. Normal affect.   Discharge Instructions Discharge Instructions     Diet - low sodium heart healthy   Complete by: As directed    Discharge instructions   Complete by: As directed    It has been a pleasure taking care of you!  You were hospitalized due to right hip fracture for which you have been treated surgically.  Follow-up with your primary care doctor in 1 to 2 weeks or sooner if needed.  Follow-up with your orthopedic surgeon per their recommendation.  Please review your new medication list and the directions on your medications before you take them.   Take care,   Increase activity slowly   Complete by: As directed       Allergies as of 10/27/2022   No Known Allergies      Medication List     STOP taking these medications    hydrochlorothiazide 25 MG tablet Commonly known as: HYDRODIURIL       TAKE these medications    acetaminophen 500 MG tablet Commonly known as: TYLENOL Take 2 tablets (1,000 mg total) by mouth every 8 (eight) hours for 5 days, THEN 2 tablets (1,000 mg total) every 8 (eight) hours as needed for up to 5 days. Start taking on: October 27, 2022   aspirin 81 MG chewable tablet Chew 1 tablet (81 mg total) by mouth 2 (two) times daily.   Co Q 10 100 MG Caps Take 1 capsule by mouth every evening.   conjugated estrogens vaginal cream Commonly known as: PREMARIN 1 applicator of cream  intravaginally 1-2 weekly qhs What changed:  how much to take how to take this when to take this additional instructions   cyanocobalamin 1000 MCG tablet Take 1 tablet (1,000 mcg total) by mouth daily.   fluticasone 50 MCG/ACT nasal spray Commonly known as: FLONASE Place 2 sprays into both nostrils daily. What changed:  when to take this reasons to take this   levothyroxine 100 MCG tablet Commonly known as: SYNTHROID Take  1 tablet (100 mcg total) by mouth daily.   multivitamin tablet Take 1 tablet by mouth every evening.   oxybutynin 10 MG 24 hr tablet Commonly known as: DITROPAN-XL Take 10 mg by mouth daily.   oxyCODONE 5 MG immediate release tablet Commonly known as: Oxy IR/ROXICODONE Take 1 tablet (5 mg total) by mouth every 6 (six) hours as needed for moderate pain (pain score 4-6).   senna-docusate 8.6-50 MG tablet Commonly known as: Senokot-S Take 1 tablet by mouth 2 (two) times daily as needed for mild constipation.   SUMAtriptan 5 MG/ACT nasal spray Commonly known as: IMITREX Place 1 spray (5 mg total) into the nose every 2 (two) hours as needed. What changed: reasons to take this               Durable Medical Equipment  (From admission, onward)           Start  Ordered   10/26/22 1245  For home use only DME 3 n 1  Once        10/26/22 1244   10/26/22 1210  For home use only DME Walker rolling  Once       Question Answer Comment  Walker: With 5 Inch Wheels   Patient needs a walker to treat with the following condition General weakness   Patient needs a walker to treat with the following condition Status post right hip replacement      10/26/22 1210   10/24/22 2025  DME 3 n 1  Once        10/24/22 2025            Consultations: Orthopedic surgery  Procedures/Studies: 6/4-prior total hip arthroplasty    DG Pelvis Portable  Result Date: 10/24/2022 CLINICAL DATA:  Right hip replacement EXAM: PORTABLE PELVIS 1-2 VIEWS COMPARISON:  Right hip x-ray 10/23/2022 FINDINGS: There is a new right hip arthroplasty in anatomic alignment. No evidence for fracture. There is soft tissue swelling and air surrounding the right hip with lateral skin staples compatible with recent surgery. IMPRESSION: Right hip arthroplasty in anatomic alignment. Electronically Signed   By: Darliss Cheney M.D.   On: 10/24/2022 20:17   DG HIP UNILAT WITH PELVIS 1V RIGHT  Result Date:  10/24/2022 CLINICAL DATA:  Elective surgery. EXAM: DG HIP (WITH OR WITHOUT PELVIS) 1V RIGHT COMPARISON:  Preoperative radiograph FINDINGS: Four fluoroscopic spot views of the pelvis and right hip obtained in the operating room. Sequential images during hip arthroplasty. Fluoroscopy time 18 seconds. Dose 1.47 mGy. IMPRESSION: Intraoperative fluoroscopy during right hip arthroplasty. Electronically Signed   By: Narda Rutherford M.D.   On: 10/24/2022 18:50   DG C-Arm 1-60 Min-No Report  Result Date: 10/24/2022 Fluoroscopy was utilized by the requesting physician.  No radiographic interpretation.   DG Chest Port 1 View  Result Date: 10/23/2022 CLINICAL DATA:  Preoperative evaluation. EXAM: PORTABLE CHEST 1 VIEW COMPARISON:  None Available. FINDINGS: The heart size and mediastinal contours are within normal limits. There is no evidence of an acute infiltrate, pleural effusion or pneumothorax. The visualized skeletal structures are unremarkable. IMPRESSION: No active disease. Electronically Signed   By: Aram Candela M.D.   On: 10/23/2022 23:16   DG Hip Unilat W or Wo Pelvis 2-3 Views Right  Result Date: 10/23/2022 CLINICAL DATA:  Recent fall with right hip pain, initial encounter EXAM: DG HIP (WITH OR WITHOUT PELVIS) 3V RIGHT COMPARISON:  None Available. FINDINGS: Pelvic ring is intact. There is a right subcapital femoral neck fracture with impaction and angulation at the fracture site. Femoral head is well seated. No soft tissue abnormality is noted. IMPRESSION: Subcapital right femoral neck fracture. Electronically Signed   By: Alcide Clever M.D.   On: 10/23/2022 19:23       The results of significant diagnostics from this hospitalization (including imaging, microbiology, ancillary and laboratory) are listed below for reference.     Microbiology: Recent Results (from the past 240 hour(s))  Surgical pcr screen     Status: Abnormal   Collection Time: 10/24/22 10:39 AM   Specimen: Nasal Mucosa;  Nasal Swab  Result Value Ref Range Status   MRSA, PCR NEGATIVE NEGATIVE Final   Staphylococcus aureus POSITIVE (A) NEGATIVE Final    Comment: (NOTE) The Xpert SA Assay (FDA approved for NASAL specimens in patients 39 years of age and older), is one component of a comprehensive surveillance program. It is not intended to diagnose  infection nor to guide or monitor treatment. Performed at [facility], [address] [place], [place] [address]      Labs:  CBC: Recent Labs  Lab 10/23/22 2109 10/24/22 0113 10/25/22 0012 10/26/22 0048 10/27/22 0838  WBC 13.0* 10.1 9.6 11.9* 9.8  NEUTROABS  --   --  8.0*  --   --   HGB 12.7 12.3 10.6* 10.6* 10.2*  HCT 38.1 37.5 33.0* 32.2* 31.0*  MCV 89.0 88.0 90.9 89.2 90.4  PLT 155 132* 115* 114* 129*   BMP &GFR Recent Labs  Lab 10/23/22 2109 10/24/22 0113 10/25/22 0012 10/26/22 0048 10/26/22 1629  NA 138 137 135 131* 135  K 3.4* 3.5 3.6 3.2* 3.9  CL 103 102 102 96* 100  CO2 25 26 26 27 28   GLUCOSE 109* 121* 114* 139* 112*  BUN 11 13 12 9  5*  CREATININE 0.81 0.81 0.70 0.75 0.71  CALCIUM 9.7 9.3 8.7* 8.9 9.1  MG  --  2.0 2.0 1.8  --   PHOS  --   --  2.6 1.8* 2.2*   Estimated Creatinine Clearance: 44.8 mL/min (by C-G formula based on SCr of 0.71 mg/dL). Liver & Pancreas: Recent Labs  Lab 10/23/22 2109 10/25/22 0012 10/26/22 0048 10/26/22 1629  AST 18 40  --   --   ALT 12 29  --   --   ALKPHOS 44 52  --   --   BILITOT 0.7 0.8  --   --   PROT 6.1* 5.0*  --   --   ALBUMIN 3.7 2.8* 2.8* 3.0*   No results for input(s): "LIPASE", "AMYLASE" in the last 168 hours. No results for input(s): "AMMONIA" in the last 168 hours. Diabetic: No results for input(s): "HGBA1C" in the last 72 hours. No results for input(s): "GLUCAP" in the last 168 hours. Cardiac Enzymes: No results for input(s): "CKTOTAL", "CKMB", "CKMBINDEX", "TROPONINI" in the last 168 hours. No results for input(s): "PROBNP" in the last 8760 hours. Coagulation  Profile: Recent Labs  Lab 10/23/22 2109  INR 1.2   Thyroid Function Tests: No results for input(s): "TSH", "T4TOTAL", "FREET4", "T3FREE", "THYROIDAB" in the last 72 hours. Lipid Profile: No results for input(s): "CHOL", "HDL", "LDLCALC", "TRIG", "CHOLHDL", "LDLDIRECT" in the last 72 hours. Anemia Panel: Recent Labs    10/26/22 0048  VITAMINB12 293  FOLATE 18.9  FERRITIN 77  TIBC 302  IRON 8*  RETICCTPCT 1.6   Urine analysis:    Component Value Date/Time   COLORURINE YELLOW 07/29/2019 0935   APPEARANCEUR CLEAR 07/29/2019 0935   LABSPEC 1.010 07/29/2019 0935   PHURINE 7.0 07/29/2019 0935   GLUCOSEU NEGATIVE 07/29/2019 0935   HGBUR NEGATIVE 07/29/2019 0935   HGBUR negative 06/16/2009 1003   BILIRUBINUR n 07/18/2017 1259   KETONESUR NEGATIVE 07/29/2019 0935   PROTEINUR NEGATIVE 07/29/2019 0935   UROBILINOGEN 0.2 07/18/2017 1259   UROBILINOGEN 0.2 06/16/2009 1003   NITRITE n 07/18/2017 1259   NITRITE negative 06/16/2009 1003   LEUKOCYTESUR Negative 07/18/2017 1259   Sepsis Labs: Invalid input(s): "PROCALCITONIN", "LACTICIDVEN"   SIGNED:  Almon Hercules, MD  [facility] 10/27/2022, 2:29 PM

## 2022-10-27 NOTE — Progress Notes (Signed)
Patient ID: Teresa Patterson, female   DOB: 10-Apr-1941, 82 y.o.   MRN: [id number] Awake and alert this morning.  Family is at the bedside.  She is mobilizing well and feels good about her ability to mobilize with supportive family at home and HHPT.  Her vital signs are stable.  Her right operative hip is stable.  She can be discharged home from an orthopedic standpoint.

## 2022-10-27 NOTE — Progress Notes (Signed)
Physical Therapy Treatment Patient Details Name: Teresa Patterson MRN: [id number] DOB: 09/28/1940 Today's Date: 10/27/2022   History of Present Illness 82 y/o F admitted on 10/23/22 after presenting to the ED following a fall resulting in R hip pain & inability to bear weight. Pt found to have R acute hip femoral neck fx & underwent R direct anterior THA. PMH: hypothyroidism, migraines    PT Comments    Review today on walk and stair climbing to prepare for home.  Pt is getting up to stand from mult surfaces, and note her main challenge is reminding of hand placement.  Will recommend HHPT to continue the plan for her recovery, and while inpt will work on strengthening, quality of transitions, gait and safety with all movement.  Follow acutely for all goals of PT.   Recommendations for follow up therapy are one component of a multi-disciplinary discharge planning process, led by the attending physician.  Recommendations may be updated based on patient status, additional functional criteria and insurance authorization.  Follow Up Recommendations       Assistance Recommended at Discharge Intermittent Supervision/Assistance  Patient can return home with the following A little help with walking and/or transfers;A little help with bathing/dressing/bathroom;Assistance with cooking/housework;Assist for transportation;Help with stairs or ramp for entrance   Equipment Recommendations  Rolling walker (2 wheels);BSC/3in1    Recommendations for Other Services       Precautions / Restrictions Precautions Precautions: Fall Precaution Comments: monitor pain and BP Restrictions Weight Bearing Restrictions: Yes RLE Weight Bearing: Weight bearing as tolerated     Mobility  Bed Mobility Overal bed mobility: Modified Independent Bed Mobility: Supine to Sit     Supine to sit: Modified independent (Device/Increase time)          Transfers Overall transfer level: Needs assistance Equipment used:  Rolling walker (2 wheels) Transfers: Sit to/from Stand Sit to Stand: Supervision                Ambulation/Gait Ambulation/Gait assistance: Min guard Gait Distance (Feet): 20 Feet Assistive device: Rolling walker (2 wheels)   Gait velocity: decreased Gait velocity interpretation: <1.31 ft/sec, indicative of household ambulator Pre-gait activities: standing balance and posture ck General Gait Details: Pt is walking short trips to BR and back, noted her clearance of RLE is improved   Stairs Stairs: Yes Stairs assistance: Min guard Stair Management: One rail Left, Forwards, Step to pattern Number of Stairs: 3 (up and down) General stair comments: Pt is able to pivot with rail and return to chair following recommendations for lead limb   Wheelchair Mobility    Modified Rankin (Stroke Patients Only)       Balance Overall balance assessment: Needs assistance Sitting-balance support: Feet supported Sitting balance-Leahy Scale: Good     Standing balance support: Bilateral upper extremity supported, During functional activity Standing balance-Leahy Scale: Fair Standing balance comment: fair static on walker and off                            Cognition Arousal/Alertness: Awake/alert Behavior During Therapy: WFL for tasks assessed/performed Overall Cognitive Status: Within Functional Limits for tasks assessed                                 General Comments: daughter present to ask questions        Exercises      General Comments General  comments (skin integrity, edema, etc.): Pt is demonstrating awareness of safety and sequence, good motor planning to move.  Minimal reminders of hand placement      Pertinent Vitals/Pain Pain Assessment Pain Assessment: Faces Faces Pain Scale: Hurts little more Pain Location: R hip with WB Pain Descriptors / Indicators: Guarding, Sore Pain Intervention(s): Limited activity within patient's  tolerance, Monitored during session, Premedicated before session, Repositioned    Home Living                          Prior Function            PT Goals (current goals can now be found in the care plan section) Acute Rehab PT Goals Patient Stated Goal: get better, return to PLOF Progress towards PT goals: Progressing toward goals    Frequency    7X/week      PT Plan Current plan remains appropriate    Co-evaluation              AM-PAC PT "6 Clicks" Mobility   Outcome Measure  Help needed turning from your back to your side while in a flat bed without using bedrails?: A Little Help needed moving from lying on your back to sitting on the side of a flat bed without using bedrails?: A Little Help needed moving to and from a bed to a chair (including a wheelchair)?: A Little Help needed standing up from a chair using your arms (e.g., wheelchair or bedside chair)?: A Little Help needed to walk in hospital room?: A Little Help needed climbing 3-5 steps with a railing? : A Little 6 Click Score: 18    End of Session Equipment Utilized During Treatment: Gait belt Activity Tolerance: Patient limited by pain Patient left: in chair;with call bell/phone within reach;with chair alarm set;with family/visitor present Nurse Communication: Mobility status PT Visit Diagnosis: Pain;Difficulty in walking, not elsewhere classified (R26.2);Muscle weakness (generalized) (M62.81) Pain - Right/Left: Right Pain - part of body: Hip     Time: 1610-9604 PT Time Calculation (min) (ACUTE ONLY): 26 min  Charges:  $Gait Training: 8-22 mins $Therapeutic Activity: 8-22 mins           Ivar Drape 10/27/2022, 1:36 PM  Samul Dada, PT PhD Acute Rehab Dept. Number: [facility] [id number] and [facility] [phone]

## 2022-10-27 NOTE — Progress Notes (Addendum)
Occupational Therapy Treatment Patient Details Name: Teresa Patterson MRN: [id number] DOB: 06-12-1940 Today's Date: 10/27/2022   History of present illness 82 y/o F admitted on 10/23/22 after presenting to the ED following a fall resulting in R hip pain & inability to bear weight. Pt found to have R acute hip femoral neck fx & underwent R direct anterior THA. PMH: hypothyroidism, migraines   OT comments  Pt progressing well during OT session, completing all functional mobility and bADLs with supervision. Pt tolerating pain well even with ambulation. OT demonstrated a mock step up of stairs training with patient as patient/family was concerned about stairs, messaged PT to provide patient with proper stairs training before DC. OT to continue to progress pt as able, DC plans updated to No follow-up OT as patient has no further OT based needs.    Recommendations for follow up therapy are one component of a multi-disciplinary discharge planning process, led by the attending physician.  Recommendations may be updated based on patient status, additional functional criteria and insurance authorization.    Assistance Recommended at Discharge Intermittent Supervision/Assistance  Patient can return home with the following  Assistance with cooking/housework;Assist for transportation   Equipment Recommendations  BSC/3in1    Recommendations for Other Services      Precautions / Restrictions Precautions Precautions: Fall Restrictions Weight Bearing Restrictions: Yes RLE Weight Bearing: Weight bearing as tolerated       Mobility Bed Mobility Overal bed mobility: Modified Independent                  Transfers Overall transfer level: Needs assistance Equipment used: Rolling walker (2 wheels) Transfers: Sit to/from Stand Sit to Stand: Supervision                 Balance Overall balance assessment: Needs assistance Sitting-balance support: Feet supported Sitting balance-Leahy Scale:  Good     Standing balance support: Bilateral upper extremity supported, During functional activity Standing balance-Leahy Scale: Fair Standing balance comment: stands statically without RW                           ADL either performed or assessed with clinical judgement   ADL                       Lower Body Dressing: Sitting/lateral leans;Supervision/safety (don socks)               Functional mobility during ADLs: Supervision/safety;Rolling walker (2 wheels)      Extremity/Trunk Assessment              Vision       Perception     Praxis      Cognition Arousal/Alertness: Awake/alert Behavior During Therapy: WFL for tasks assessed/performed Overall Cognitive Status: Within Functional Limits for tasks assessed                                 General Comments: daughter present during OT session        Exercises      Shoulder Instructions       General Comments      Pertinent Vitals/ Pain       Pain Assessment Pain Assessment: 0-10 Pain Score: 3  Pain Location: R hip with ambulation Pain Descriptors / Indicators: Discomfort, Grimacing, Guarding Pain Intervention(s): Monitored during session, Repositioned  Home Living  Prior Functioning/Environment              Frequency  Min 2X/week        Progress Toward Goals  OT Goals(current goals can now be found in the care plan section)  Progress towards OT goals: Progressing toward goals  Acute Rehab OT Goals OT Goal Formulation: With patient Time For Goal Achievement: 11/08/22 Potential to Achieve Goals: Good  Plan Frequency remains appropriate;Discharge plan needs to be updated    Co-evaluation                 AM-PAC OT "6 Clicks" Daily Activity     Outcome Measure   Help from another person eating meals?: None Help from another person taking care of personal grooming?: A  Little Help from another person toileting, which includes using toliet, bedpan, or urinal?: A Little Help from another person bathing (including washing, rinsing, drying)?: A Little Help from another person to put on and taking off regular upper body clothing?: None Help from another person to put on and taking off regular lower body clothing?: A Little 6 Click Score: 20    End of Session Equipment Utilized During Treatment: Rolling walker (2 wheels);Gait belt  OT Visit Diagnosis: Unsteadiness on feet (R26.81);History of falling (Z91.81);Muscle weakness (generalized) (M62.81)   Activity Tolerance Patient tolerated treatment well   Patient Left in bed;with call bell/phone within reach;with family/visitor present   Nurse Communication Mobility status        Time: 6962-9528 OT Time Calculation (min): 19 min  Charges: OT General Charges $OT Visit: 1 Visit OT Treatments $Therapeutic Activity: 8-22 mins  10/27/2022  AB, OTR/L  Acute Rehabilitation Services  Office: [phone]   Tristan Schroeder 10/27/2022, 8:58 AM

## 2022-10-28 DIAGNOSIS — Z86010 Personal history of colonic polyps: Secondary | ICD-10-CM | POA: Diagnosis not present

## 2022-10-28 DIAGNOSIS — Z7982 Long term (current) use of aspirin: Secondary | ICD-10-CM | POA: Diagnosis not present

## 2022-10-28 DIAGNOSIS — I10 Essential (primary) hypertension: Secondary | ICD-10-CM | POA: Diagnosis not present

## 2022-10-28 DIAGNOSIS — Z96641 Presence of right artificial hip joint: Secondary | ICD-10-CM | POA: Diagnosis not present

## 2022-10-28 DIAGNOSIS — D696 Thrombocytopenia, unspecified: Secondary | ICD-10-CM | POA: Diagnosis not present

## 2022-10-28 DIAGNOSIS — D509 Iron deficiency anemia, unspecified: Secondary | ICD-10-CM | POA: Diagnosis not present

## 2022-10-28 DIAGNOSIS — S72011D Unspecified intracapsular fracture of right femur, subsequent encounter for closed fracture with routine healing: Secondary | ICD-10-CM | POA: Diagnosis not present

## 2022-10-28 DIAGNOSIS — Z471 Aftercare following joint replacement surgery: Secondary | ICD-10-CM | POA: Diagnosis not present

## 2022-10-28 DIAGNOSIS — G43909 Migraine, unspecified, not intractable, without status migrainosus: Secondary | ICD-10-CM | POA: Diagnosis not present

## 2022-10-28 DIAGNOSIS — E039 Hypothyroidism, unspecified: Secondary | ICD-10-CM | POA: Diagnosis not present

## 2022-10-28 DIAGNOSIS — M8580 Other specified disorders of bone density and structure, unspecified site: Secondary | ICD-10-CM | POA: Diagnosis not present

## 2022-10-30 ENCOUNTER — Telehealth: Payer: Self-pay | Admitting: Orthopaedic Surgery

## 2022-10-30 ENCOUNTER — Telehealth: Payer: Self-pay

## 2022-10-30 NOTE — Telephone Encounter (Signed)
Part of last message about HHPT CB # [phone] okay to leave VM

## 2022-10-30 NOTE — Transitions of Care (Post Inpatient/ED Visit) (Signed)
10/30/2022  Name: Teresa Patterson MRN: [id number] DOB: Feb 22, 1941  Today's TOC FU Call Status: Today's TOC FU Call Status:: Successful TOC FU Call Competed TOC FU Call Complete Date: 10/30/22  Transition Care Management Follow-up Telephone Call Date of Discharge: 10/27/22 Discharge Facility: [facility]) Type of Discharge: Inpatient Admission Primary Inpatient Discharge Diagnosis:: 'closed fracture of neck of femur" How have you been since you were released from the hospital?: Better (Pt states she is doing well-up moving around more with use of DME.States she is no longer taking Oxycodone-only taking Tylenol and pain is controlled. Current pain level 0/10.Appetite starting to increase. LBM this AM. Surg drsg intact and no issues.) Any questions or concerns?: No  Items Reviewed: Did you receive and understand the discharge instructions provided?: Yes Medications obtained,verified, and reconciled?: Yes (Medications Reviewed) Any new allergies since your discharge?: No Dietary orders reviewed?: Yes Type of Diet Ordered:: low salt/heart healthy Do you have support at home?: Yes People in Home: child(ren), adult Name of Support/Comfort Primary Source: daughters helping her out while she recovers  Medications Reviewed Today: Medications Reviewed Today     Reviewed by Charlyn Minerva, RN (Registered Nurse) on 10/30/22 at 1332  Med List Status: <None>   Medication Order Taking? Sig Documenting Provider Last Dose Status Informant  acetaminophen (TYLENOL) 500 MG tablet [id number] Yes Take 2 tablets (1,000 mg total) by mouth every 8 (eight) hours for 5 days, THEN 2 tablets (1,000 mg total) every 8 (eight) hours as needed for up to 5 days. Almon Hercules, MD Taking Active   aspirin 81 MG chewable tablet [id number] Yes Chew 1 tablet (81 mg total) by mouth 2 (two) times daily. Kathryne Hitch, MD Taking Active   Coenzyme Q10 (CO Q 10) 100 MG CAPS [id number] Yes Take 1 capsule  by mouth every evening. [provider] Taking Active Self  conjugated estrogens (PREMARIN) vaginal cream [id number] Yes 1 applicator of cream  intravaginally 1-2 weekly qhs  Patient taking differently: Place 1 applicator vaginally See admin instructions. 1 applicator of cream  intravaginally 1-2 times a week. No specific days per patient   Natalia Leatherwood, DO Taking Active Self  cyanocobalamin 1000 MCG tablet [id number] Yes Take 1 tablet (1,000 mcg total) by mouth daily. Almon Hercules, MD Taking Active   fluticasone Aleda Grana) 50 MCG/ACT nasal spray [id number] Yes Place 2 sprays into both nostrils daily.  Patient taking differently: Place 2 sprays into both nostrils daily as needed for allergies.   Claiborne Billings, Renee A, DO Taking Active Self  levothyroxine (SYNTHROID) 100 MCG tablet [id number] Yes Take 1 tablet (100 mcg total) by mouth daily. Claiborne Billings, Renee A, DO Taking Active Self  Multiple Vitamin (MULTIVITAMIN) tablet [id number] Yes Take 1 tablet by mouth every evening. [provider] Taking Active Self  oxybutynin (DITROPAN-XL) 10 MG 24 hr tablet [id number] Yes Take 10 mg by mouth daily. [provider] Taking Active Self  oxyCODONE (OXY IR/ROXICODONE) 5 MG immediate release tablet [id number] Yes Take 1 tablet (5 mg total) by mouth every 6 (six) hours as needed for moderate pain (pain score 4-6). Kathryne Hitch, MD Taking Active   senna-docusate (SENOKOT-S) 8.6-50 MG tablet [id number]  Take 1 tablet by mouth 2 (two) times daily as needed for mild constipation. Almon Hercules, MD  Active   SUMAtriptan Willette Brace) 5 MG/ACT nasal spray [id number] Yes Place 1 spray (5 mg total) into the nose every 2 (two) hours as needed.  Patient taking differently: Place  1 spray into the nose every 2 (two) hours as needed for migraine.   Kuneff, Renee A, DO Taking Active Self  Wheat Dextrin (BENEFIBER DRINK MIX PO) [id number] Yes Take 1 packet by mouth daily as needed. [provider]  Taking Active Self            Home Care and Equipment/Supplies: Were Home Health Services Ordered?: Yes Name of Home Health Agency:: [facility] Has Agency set up a time to come to your home?: Yes First Home Health Visit Date: 10/28/22 Any new equipment or medical supplies ordered?: No  Functional Questionnaire: Do you need assistance with bathing/showering or dressing?: No Do you need assistance with meal preparation?: No Do you need assistance with eating?: No Do you have difficulty maintaining continence: No Do you need assistance with getting out of bed/getting out of a chair/moving?: No Do you have difficulty managing or taking your medications?: No  Follow up appointments reviewed: PCP Follow-up appointment confirmed?: Yes Date of PCP follow-up appointment?: 11/07/22 Follow-up Provider: Dr. Hollace Kinnier Specialist [facility] Follow-up appointment confirmed?: No Reason Specialist Follow-Up Not Confirmed: Patient has Specialist Provider Number and will Call for Appointment (Pt will call surgeon office today to make appt) Do you need transportation to your follow-up appointment?: No (pt confirms her family will take her to appts) Do you understand care options if your condition(s) worsen?: Yes-patient verbalized understanding  SDOH Interventions Today    Flowsheet Row Most Recent Value  SDOH Interventions   Food Insecurity Interventions Intervention Not Indicated  Transportation Interventions Intervention Not Indicated      TOC Interventions Today    Flowsheet Row Most Recent Value  TOC Interventions   TOC Interventions Discussed/Reviewed TOC Interventions Discussed, Post op wound/incision care, Post discharge activity limitations per provider, S/S of infection, Arranged PCP follow up less than 12 days/Care Guide scheduled      Interventions Today    Flowsheet Row Most Recent Value  General Interventions   General Interventions Discussed/Reviewed General Interventions  Discussed, Doctor Visits  Doctor Visits Discussed/Reviewed Doctor Visits Discussed, Specialist, PCP  PCP/Specialist Visits Compliance with follow-up visit  Education Interventions   Education Provided Provided Education  Provided Verbal Education On Nutrition, When to see the doctor, Medication, Other  [pain mgmt/bowel mgmt]  Nutrition Interventions   Nutrition Discussed/Reviewed Nutrition Discussed  Pharmacy Interventions   Pharmacy Dicussed/Reviewed Pharmacy Topics Discussed, Medications and their functions  Safety Interventions   Safety Discussed/Reviewed Safety Discussed, Home Safety      TOC Interventions Today    Flowsheet Row Most Recent Value  TOC Interventions   TOC Interventions Discussed/Reviewed TOC Interventions Discussed, Post op wound/incision care, Post discharge activity limitations per provider, S/S of infection, Arranged PCP follow up less than 12 days/Care Guide scheduled        Antionette Fairy, Cathrine Muster [facility] Care Management Care Management Community Coordinator Direct Phone: [phone] Toll Free: [phone] Fax: [phone]

## 2022-10-30 NOTE — Telephone Encounter (Signed)
Lvm advising  

## 2022-10-30 NOTE — Telephone Encounter (Signed)
Teresa Patterson from [place] is requesting verbal orders for HHPT 1 week 1 3 week 2. Would like to clarify she is weight bearing as tolerated

## 2022-10-31 DIAGNOSIS — Z96641 Presence of right artificial hip joint: Secondary | ICD-10-CM | POA: Diagnosis not present

## 2022-10-31 DIAGNOSIS — I10 Essential (primary) hypertension: Secondary | ICD-10-CM | POA: Diagnosis not present

## 2022-10-31 DIAGNOSIS — M8580 Other specified disorders of bone density and structure, unspecified site: Secondary | ICD-10-CM | POA: Diagnosis not present

## 2022-10-31 DIAGNOSIS — S72011D Unspecified intracapsular fracture of right femur, subsequent encounter for closed fracture with routine healing: Secondary | ICD-10-CM | POA: Diagnosis not present

## 2022-10-31 DIAGNOSIS — Z86010 Personal history of colonic polyps: Secondary | ICD-10-CM | POA: Diagnosis not present

## 2022-10-31 DIAGNOSIS — E039 Hypothyroidism, unspecified: Secondary | ICD-10-CM | POA: Diagnosis not present

## 2022-10-31 DIAGNOSIS — D696 Thrombocytopenia, unspecified: Secondary | ICD-10-CM | POA: Diagnosis not present

## 2022-10-31 DIAGNOSIS — G43909 Migraine, unspecified, not intractable, without status migrainosus: Secondary | ICD-10-CM | POA: Diagnosis not present

## 2022-10-31 DIAGNOSIS — D509 Iron deficiency anemia, unspecified: Secondary | ICD-10-CM | POA: Diagnosis not present

## 2022-10-31 DIAGNOSIS — Z7982 Long term (current) use of aspirin: Secondary | ICD-10-CM | POA: Diagnosis not present

## 2022-11-02 DIAGNOSIS — Z7982 Long term (current) use of aspirin: Secondary | ICD-10-CM | POA: Diagnosis not present

## 2022-11-02 DIAGNOSIS — G43909 Migraine, unspecified, not intractable, without status migrainosus: Secondary | ICD-10-CM | POA: Diagnosis not present

## 2022-11-02 DIAGNOSIS — Z86010 Personal history of colonic polyps: Secondary | ICD-10-CM | POA: Diagnosis not present

## 2022-11-02 DIAGNOSIS — D696 Thrombocytopenia, unspecified: Secondary | ICD-10-CM | POA: Diagnosis not present

## 2022-11-02 DIAGNOSIS — Z96641 Presence of right artificial hip joint: Secondary | ICD-10-CM | POA: Diagnosis not present

## 2022-11-02 DIAGNOSIS — S72011D Unspecified intracapsular fracture of right femur, subsequent encounter for closed fracture with routine healing: Secondary | ICD-10-CM | POA: Diagnosis not present

## 2022-11-02 DIAGNOSIS — I10 Essential (primary) hypertension: Secondary | ICD-10-CM | POA: Diagnosis not present

## 2022-11-02 DIAGNOSIS — E039 Hypothyroidism, unspecified: Secondary | ICD-10-CM | POA: Diagnosis not present

## 2022-11-02 DIAGNOSIS — D509 Iron deficiency anemia, unspecified: Secondary | ICD-10-CM | POA: Diagnosis not present

## 2022-11-02 DIAGNOSIS — M8580 Other specified disorders of bone density and structure, unspecified site: Secondary | ICD-10-CM | POA: Diagnosis not present

## 2022-11-03 DIAGNOSIS — I10 Essential (primary) hypertension: Secondary | ICD-10-CM | POA: Diagnosis not present

## 2022-11-03 DIAGNOSIS — M8580 Other specified disorders of bone density and structure, unspecified site: Secondary | ICD-10-CM | POA: Diagnosis not present

## 2022-11-03 DIAGNOSIS — Z7982 Long term (current) use of aspirin: Secondary | ICD-10-CM | POA: Diagnosis not present

## 2022-11-03 DIAGNOSIS — G43909 Migraine, unspecified, not intractable, without status migrainosus: Secondary | ICD-10-CM | POA: Diagnosis not present

## 2022-11-03 DIAGNOSIS — E039 Hypothyroidism, unspecified: Secondary | ICD-10-CM | POA: Diagnosis not present

## 2022-11-03 DIAGNOSIS — S72011D Unspecified intracapsular fracture of right femur, subsequent encounter for closed fracture with routine healing: Secondary | ICD-10-CM | POA: Diagnosis not present

## 2022-11-03 DIAGNOSIS — D696 Thrombocytopenia, unspecified: Secondary | ICD-10-CM | POA: Diagnosis not present

## 2022-11-03 DIAGNOSIS — D509 Iron deficiency anemia, unspecified: Secondary | ICD-10-CM | POA: Diagnosis not present

## 2022-11-03 DIAGNOSIS — Z86010 Personal history of colonic polyps: Secondary | ICD-10-CM | POA: Diagnosis not present

## 2022-11-03 DIAGNOSIS — Z96641 Presence of right artificial hip joint: Secondary | ICD-10-CM | POA: Diagnosis not present

## 2022-11-06 ENCOUNTER — Ambulatory Visit (INDEPENDENT_AMBULATORY_CARE_PROVIDER_SITE_OTHER): Payer: Medicare HMO | Admitting: Orthopaedic Surgery

## 2022-11-06 ENCOUNTER — Encounter: Payer: Self-pay | Admitting: Orthopaedic Surgery

## 2022-11-06 DIAGNOSIS — G43909 Migraine, unspecified, not intractable, without status migrainosus: Secondary | ICD-10-CM | POA: Diagnosis not present

## 2022-11-06 DIAGNOSIS — Z96641 Presence of right artificial hip joint: Secondary | ICD-10-CM | POA: Diagnosis not present

## 2022-11-06 DIAGNOSIS — S72011D Unspecified intracapsular fracture of right femur, subsequent encounter for closed fracture with routine healing: Secondary | ICD-10-CM | POA: Diagnosis not present

## 2022-11-06 DIAGNOSIS — D696 Thrombocytopenia, unspecified: Secondary | ICD-10-CM | POA: Diagnosis not present

## 2022-11-06 DIAGNOSIS — I10 Essential (primary) hypertension: Secondary | ICD-10-CM | POA: Diagnosis not present

## 2022-11-06 DIAGNOSIS — Z86010 Personal history of colonic polyps: Secondary | ICD-10-CM | POA: Diagnosis not present

## 2022-11-06 DIAGNOSIS — Z7982 Long term (current) use of aspirin: Secondary | ICD-10-CM | POA: Diagnosis not present

## 2022-11-06 DIAGNOSIS — E039 Hypothyroidism, unspecified: Secondary | ICD-10-CM | POA: Diagnosis not present

## 2022-11-06 DIAGNOSIS — D509 Iron deficiency anemia, unspecified: Secondary | ICD-10-CM | POA: Diagnosis not present

## 2022-11-06 DIAGNOSIS — M8580 Other specified disorders of bone density and structure, unspecified site: Secondary | ICD-10-CM | POA: Diagnosis not present

## 2022-11-06 NOTE — Progress Notes (Signed)
The patient is here for first postoperative visit status post a right total hip replacement to treat an acute right hip displaced femoral neck fracture.  She is someone who is very active and does live alone.  She is 82 years old but has good family support.  Prior to surgery she did not walk with assistive device and drove on her own as well.  She fractured her hip washing off her porch.  She reports that she is only taken Tylenol for pain.  She has been having home therapy with working on her balance and coordination as well as strengthening.  Her right hip moves smoothly and fluidly.  The staples been removed and Steri-Strips applied to the incision.  There is just a small seroma but overall the hip looks good itself.  I did go over her preoperative and postoperative x-rays with her and her daughter.  All questions and concerns were answered and addressed.  Will see her back in 4 weeks to see how she is doing overall but no x-rays are needed.

## 2022-11-07 ENCOUNTER — Inpatient Hospital Stay: Payer: Medicare HMO | Admitting: Family Medicine

## 2022-11-08 DIAGNOSIS — M8580 Other specified disorders of bone density and structure, unspecified site: Secondary | ICD-10-CM | POA: Diagnosis not present

## 2022-11-08 DIAGNOSIS — S72011D Unspecified intracapsular fracture of right femur, subsequent encounter for closed fracture with routine healing: Secondary | ICD-10-CM | POA: Diagnosis not present

## 2022-11-08 DIAGNOSIS — Z96641 Presence of right artificial hip joint: Secondary | ICD-10-CM | POA: Diagnosis not present

## 2022-11-08 DIAGNOSIS — G43909 Migraine, unspecified, not intractable, without status migrainosus: Secondary | ICD-10-CM | POA: Diagnosis not present

## 2022-11-08 DIAGNOSIS — E039 Hypothyroidism, unspecified: Secondary | ICD-10-CM | POA: Diagnosis not present

## 2022-11-08 DIAGNOSIS — D696 Thrombocytopenia, unspecified: Secondary | ICD-10-CM | POA: Diagnosis not present

## 2022-11-08 DIAGNOSIS — Z7982 Long term (current) use of aspirin: Secondary | ICD-10-CM | POA: Diagnosis not present

## 2022-11-08 DIAGNOSIS — I10 Essential (primary) hypertension: Secondary | ICD-10-CM | POA: Diagnosis not present

## 2022-11-08 DIAGNOSIS — Z86010 Personal history of colonic polyps: Secondary | ICD-10-CM | POA: Diagnosis not present

## 2022-11-08 DIAGNOSIS — D509 Iron deficiency anemia, unspecified: Secondary | ICD-10-CM | POA: Diagnosis not present

## 2022-11-10 DIAGNOSIS — Z96641 Presence of right artificial hip joint: Secondary | ICD-10-CM | POA: Diagnosis not present

## 2022-11-10 DIAGNOSIS — S72011D Unspecified intracapsular fracture of right femur, subsequent encounter for closed fracture with routine healing: Secondary | ICD-10-CM | POA: Diagnosis not present

## 2022-11-10 DIAGNOSIS — Z7982 Long term (current) use of aspirin: Secondary | ICD-10-CM | POA: Diagnosis not present

## 2022-11-10 DIAGNOSIS — G43909 Migraine, unspecified, not intractable, without status migrainosus: Secondary | ICD-10-CM | POA: Diagnosis not present

## 2022-11-10 DIAGNOSIS — D509 Iron deficiency anemia, unspecified: Secondary | ICD-10-CM | POA: Diagnosis not present

## 2022-11-10 DIAGNOSIS — D696 Thrombocytopenia, unspecified: Secondary | ICD-10-CM | POA: Diagnosis not present

## 2022-11-10 DIAGNOSIS — E039 Hypothyroidism, unspecified: Secondary | ICD-10-CM | POA: Diagnosis not present

## 2022-11-10 DIAGNOSIS — Z86010 Personal history of colonic polyps: Secondary | ICD-10-CM | POA: Diagnosis not present

## 2022-11-10 DIAGNOSIS — M8580 Other specified disorders of bone density and structure, unspecified site: Secondary | ICD-10-CM | POA: Diagnosis not present

## 2022-11-10 DIAGNOSIS — I10 Essential (primary) hypertension: Secondary | ICD-10-CM | POA: Diagnosis not present

## 2022-11-29 ENCOUNTER — Ambulatory Visit (INDEPENDENT_AMBULATORY_CARE_PROVIDER_SITE_OTHER): Payer: Medicare HMO

## 2022-11-29 VITALS — Wt 130.0 lb

## 2022-11-29 DIAGNOSIS — Z0000 Encounter for general adult medical examination without abnormal findings: Secondary | ICD-10-CM

## 2022-11-29 NOTE — Patient Instructions (Signed)
Teresa Patterson , Thank you for taking time to come for your Medicare Wellness Visit. I appreciate your ongoing commitment to your health goals. Please review the following plan we discussed and let me know if I can assist you in the future.   These are the goals we discussed:  Goals      Patient Stated     Continue to stay healthy         This is a list of the screening recommended for you and due dates:  Health Maintenance  Topic Date Due   Flu Shot  12/21/2022   Medicare Annual Wellness Visit  11/29/2023   Mammogram  02/24/2024   DEXA scan (bone density measurement)  02/24/2024   DTaP/Tdap/Td vaccine (3 - Tdap) 07/11/2026   Pneumonia Vaccine  Completed   Zoster (Shingles) Vaccine  Completed   HPV Vaccine  Aged Out   COVID-19 Vaccine  Discontinued    Advanced directives: Please bring a copy of your health care power of attorney and living will to the office at your convenience.  Conditions/risks identified: stay healthy   Next appointment: Follow up in one year for your annual wellness visit    Preventive Care 65 Years and Older, Female Preventive care refers to lifestyle choices and visits with your health care provider that can promote health and wellness. What does preventive care include? A yearly physical exam. This is also called an annual well check. Dental exams once or twice a year. Routine eye exams. Ask your health care provider how often you should have your eyes checked. Personal lifestyle choices, including: Daily care of your teeth and gums. Regular physical activity. Eating a healthy diet. Avoiding tobacco and drug use. Limiting alcohol use. Practicing safe sex. Taking low-dose aspirin every day. Taking vitamin and mineral supplements as recommended by your health care provider. What happens during an annual well check? The services and screenings done by your health care provider during your annual well check will depend on your age, overall health,  lifestyle risk factors, and family history of disease. Counseling  Your health care provider may ask you questions about your: Alcohol use. Tobacco use. Drug use. Emotional well-being. Home and relationship well-being. Sexual activity. Eating habits. History of falls. Memory and ability to understand (cognition). Work and work Astronomer. Reproductive health. Screening  You may have the following tests or measurements: Height, weight, and BMI. Blood pressure. Lipid and cholesterol levels. These may be checked every 5 years, or more frequently if you are over 25 years old. Skin check. Lung cancer screening. You may have this screening every year starting at age 35 if you have a 30-pack-year history of smoking and currently smoke or have quit within the past 15 years. Fecal occult blood test (FOBT) of the stool. You may have this test every year starting at age 39. Flexible sigmoidoscopy or colonoscopy. You may have a sigmoidoscopy every 5 years or a colonoscopy every 10 years starting at age 27. Hepatitis C blood test. Hepatitis B blood test. Sexually transmitted disease (STD) testing. Diabetes screening. This is done by checking your blood sugar (glucose) after you have not eaten for a while (fasting). You may have this done every 1-3 years. Bone density scan. This is done to screen for osteoporosis. You may have this done starting at age 4. Mammogram. This may be done every 1-2 years. Talk to your health care provider about how often you should have regular mammograms. Talk with your health care provider about  your test results, treatment options, and if necessary, the need for more tests. Vaccines  Your health care provider may recommend certain vaccines, such as: Influenza vaccine. This is recommended every year. Tetanus, diphtheria, and acellular pertussis (Tdap, Td) vaccine. You may need a Td booster every 10 years. Zoster vaccine. You may need this after age  79. Pneumococcal 13-valent conjugate (PCV13) vaccine. One dose is recommended after age 53. Pneumococcal polysaccharide (PPSV23) vaccine. One dose is recommended after age 10. Talk to your health care provider about which screenings and vaccines you need and how often you need them. This information is not intended to replace advice given to you by your health care provider. Make sure you discuss any questions you have with your health care provider. Document Released: 06/04/2015 Document Revised: 01/26/2016 Document Reviewed: 03/09/2015 Elsevier Interactive Patient Education  2017 [employer].  Fall Prevention in the Home Falls can cause injuries. They can happen to people of all ages. There are many things you can do to make your home safe and to help prevent falls. What can I do on the outside of my home? Regularly fix the edges of walkways and driveways and fix any cracks. Remove anything that might make you trip as you walk through a door, such as a raised step or threshold. Trim any bushes or trees on the path to your home. Use bright outdoor lighting. Clear any walking paths of anything that might make someone trip, such as rocks or tools. Regularly check to see if handrails are loose or broken. Make sure that both sides of any steps have handrails. Any raised decks and porches should have guardrails on the edges. Have any leaves, snow, or ice cleared regularly. Use sand or salt on walking paths during winter. Clean up any spills in your garage right away. This includes oil or grease spills. What can I do in the bathroom? Use night lights. Install grab bars by the toilet and in the tub and shower. Do not use towel bars as grab bars. Use non-skid mats or decals in the tub or shower. If you need to sit down in the shower, use a plastic, non-slip stool. Keep the floor dry. Clean up any water that spills on the floor as soon as it happens. Remove soap buildup in the tub or shower  regularly. Attach bath mats securely with double-sided non-slip rug tape. Do not have throw rugs and other things on the floor that can make you trip. What can I do in the bedroom? Use night lights. Make sure that you have a light by your bed that is easy to reach. Do not use any sheets or blankets that are too big for your bed. They should not hang down onto the floor. Have a firm chair that has side arms. You can use this for support while you get dressed. Do not have throw rugs and other things on the floor that can make you trip. What can I do in the kitchen? Clean up any spills right away. Avoid walking on wet floors. Keep items that you use a lot in easy-to-reach places. If you need to reach something above you, use a strong step stool that has a grab bar. Keep electrical cords out of the way. Do not use floor polish or wax that makes floors slippery. If you must use wax, use non-skid floor wax. Do not have throw rugs and other things on the floor that can make you trip. What can I do with  my stairs? Do not leave any items on the stairs. Make sure that there are handrails on both sides of the stairs and use them. Fix handrails that are broken or loose. Make sure that handrails are as long as the stairways. Check any carpeting to make sure that it is firmly attached to the stairs. Fix any carpet that is loose or worn. Avoid having throw rugs at the top or bottom of the stairs. If you do have throw rugs, attach them to the floor with carpet tape. Make sure that you have a light switch at the top of the stairs and the bottom of the stairs. If you do not have them, ask someone to add them for you. What else can I do to help prevent falls? Wear shoes that: Do not have high heels. Have rubber bottoms. Are comfortable and fit you well. Are closed at the toe. Do not wear sandals. If you use a stepladder: Make sure that it is fully opened. Do not climb a closed stepladder. Make sure that  both sides of the stepladder are locked into place. Ask someone to hold it for you, if possible. Clearly mark and make sure that you can see: Any grab bars or handrails. First and last steps. Where the edge of each step is. Use tools that help you move around (mobility aids) if they are needed. These include: Canes. Walkers. Scooters. Crutches. Turn on the lights when you go into a dark area. Replace any light bulbs as soon as they burn out. Set up your furniture so you have a clear path. Avoid moving your furniture around. If any of your floors are uneven, fix them. If there are any pets around you, be aware of where they are. Review your medicines with your doctor. Some medicines can make you feel dizzy. This can increase your chance of falling. Ask your doctor what other things that you can do to help prevent falls. This information is not intended to replace advice given to you by your health care provider. Make sure you discuss any questions you have with your health care provider. Document Released: 03/04/2009 Document Revised: 10/14/2015 Document Reviewed: 06/12/2014 Elsevier Interactive Patient Education  2017 [employer].

## 2022-11-29 NOTE — Progress Notes (Signed)
Subjective:   Teresa Patterson is a 82 y.o. female who presents for Medicare Annual (Subsequent) preventive examination.  Visit Complete: Virtual  I connected with  Teresa Patterson on 11/29/22 by a audio enabled telemedicine application and verified that I am speaking with the correct person using two identifiers.  Patient Location: Home  Provider Location: Office/Clinic  I discussed the limitations of evaluation and management by telemedicine. The patient expressed understanding and agreed to proceed.  Review of Systems     Cardiac Risk Factors include: advanced age (>64men, >21 women)     Objective:    Today's Vitals   11/29/22 0955  Weight: 130 lb (59 kg)   Body mass index is 23.03 kg/m.     11/29/2022   10:00 AM 10/23/2022    6:14 PM 01/25/2022   10:20 AM 10/22/2013    9:35 AM  Advanced Directives  Does Patient Have a Medical Advance Directive? Yes No Yes Patient has advance directive, copy not in chart  Type of Advance Directive Healthcare Power of [place];Living will  Healthcare Power of [place];Living will   Copy of Healthcare Power of Attorney in Chart? No - copy requested     Would patient like information on creating a medical advance directive?  No - Patient declined      Current Medications (verified) Outpatient Encounter Medications as of 11/29/2022  Medication Sig   aspirin 81 MG chewable tablet Chew 1 tablet (81 mg total) by mouth 2 (two) times daily.   Coenzyme Q10 (CO Q 10) 100 MG CAPS Take 1 capsule by mouth every evening.   conjugated estrogens (PREMARIN) vaginal cream 1 applicator of cream  intravaginally 1-2 weekly qhs (Patient taking differently: Place 1 applicator vaginally See admin instructions. 1 applicator of cream  intravaginally 1-2 times a week. No specific days per patient)   cyanocobalamin 1000 MCG tablet Take 1 tablet (1,000 mcg total) by mouth daily.   fluticasone (FLONASE) 50 MCG/ACT nasal spray Place 2 sprays into both nostrils daily.  (Patient taking differently: Place 2 sprays into both nostrils daily as needed for allergies.)   levothyroxine (SYNTHROID) 100 MCG tablet Take 1 tablet (100 mcg total) by mouth daily.   Multiple Vitamin (MULTIVITAMIN) tablet Take 1 tablet by mouth every evening.   oxybutynin (DITROPAN-XL) 10 MG 24 hr tablet Take 10 mg by mouth daily.   oxyCODONE (OXY IR/ROXICODONE) 5 MG immediate release tablet Take 1 tablet (5 mg total) by mouth every 6 (six) hours as needed for moderate pain (pain score 4-6).   senna-docusate (SENOKOT-S) 8.6-50 MG tablet Take 1 tablet by mouth 2 (two) times daily as needed for mild constipation.   SUMAtriptan (IMITREX) 5 MG/ACT nasal spray Place 1 spray (5 mg total) into the nose every 2 (two) hours as needed. (Patient taking differently: Place 1 spray into the nose every 2 (two) hours as needed for migraine.)   Wheat Dextrin (BENEFIBER DRINK MIX PO) Take 1 packet by mouth daily as needed.   No facility-administered encounter medications on file as of 11/29/2022.    Allergies (verified) Patient has no known allergies.   History: Past Medical History:  Diagnosis Date   Colon polyp    Hypothyroid    hypothyroid   Migraine    Multiple dysplastic nevi    established with Dr. Karlyn Agee dermatology   Osteopenia    Past Surgical History:  Procedure Laterality Date   ABDOMINAL HYSTERECTOMY  1972   CHOLECYSTECTOMY  1995   OVARIAN CYST  REMOVAL Right    right   TONSILLECTOMY AND ADENOIDECTOMY  1952   TOTAL HIP ARTHROPLASTY Right 10/24/2022   Procedure: TOTAL HIP ARTHROPLASTY ANTERIOR APPROACH;  Surgeon: Kathryne Hitch, MD;  Location: [facility] OR;  Service: Orthopedics;  Laterality: Right;   Family History  Problem Relation Age of Onset   Brain cancer Brother        BRAIN TUMOR   Cancer Maternal Uncle        ?   Social History   Socioeconomic History   Marital status: Widowed    Spouse name: Not on file   Number of children: Not on file   Years of education:  Not on file   Highest education level: Not on file  Occupational History   Not on file  Tobacco Use   Smoking status: Never   Smokeless tobacco: Never  Vaping Use   Vaping Use: Never used  Substance and Sexual Activity   Alcohol use: No    Alcohol/week: 0.0 standard drinks of alcohol   Drug use: No   Sexual activity: Not Currently    Partners: Male  Other Topics Concern   Not on file  Social History Narrative   Marital status/children/pets: Widowed. 3 children. Lives alone.    Education/employment: GED, retired.    Safety:      -smoke alarm in the home:Yes     - wears seatbelt: Yes     - Feels safe in their surroundings: Yes   Social Determinants of Health   Financial Resource Strain: Low Risk  (11/29/2022)   Overall Financial Resource Strain (CARDIA)    Difficulty of Paying Living Expenses: Not hard at all  Food Insecurity: No Food Insecurity (11/29/2022)   Hunger Vital Sign    Worried About Running Out of Food in the Last Year: Never true    Ran Out of Food in the Last Year: Never true  Transportation Needs: No Transportation Needs (11/29/2022)   PRAPARE - Administrator, Civil Service (Medical): No    Lack of Transportation (Non-Medical): No  Physical Activity: Sufficiently Active (11/29/2022)   Exercise Vital Sign    Days of Exercise per Week: 7 days    Minutes of Exercise per Session: 90 min  Stress: No Stress Concern Present (11/29/2022)   [employer] of Occupational Health - Occupational Stress Questionnaire    Feeling of Stress : Not at all  Social Connections: Moderately Isolated (11/29/2022)   Social Connection and Isolation Panel [NHANES]    Frequency of Communication with Friends and Family: More than three times a week    Frequency of Social Gatherings with Friends and Family: More than three times a week    Attends Religious Services: 1 to 4 times per year    Active Member of [employer] or Organizations: No    Attends Banker  Meetings: Never    Marital Status: Widowed    Tobacco Counseling Counseling given: Not Answered   Clinical Intake:  Pre-visit preparation completed: Yes  Pain : No/denies pain     BMI - recorded: 23.03 Nutritional Status: BMI of 19-24  Normal Nutritional Risks: None Diabetes: No  How often do you need to have someone help you when you read instructions, pamphlets, or other written materials from your doctor or pharmacy?: 1 - Never  Interpreter Needed?: No  Information entered by :: Lanier Ensign, LPN   Activities of Daily Living    11/29/2022    9:57 AM 10/24/2022  8:00 PM  In your present state of health, do you have any difficulty performing the following activities:  Hearing? 0 0  Vision? 0 0  Difficulty concentrating or making decisions? 0 0  Walking or climbing stairs? 0 1  Dressing or bathing? 0 1  Doing errands, shopping? 0 1  Comment  new hip surgery this admission  Preparing Food and eating ? N   Using the Toilet? N   In the past six months, have you accidently leaked urine? N   Do you have problems with loss of bowel control? N   Managing your Medications? N   Managing your Finances? N   Housekeeping or managing your Housekeeping? N     Patient Care Team: Natalia Leatherwood, DO as PCP - General (Family Medicine) Arminda Resides, MD as Consulting Physician (Dermatology) Sharrell Ku, MD as Consulting Physician (Gastroenterology) Nelson Chimes, MD as Consulting Physician (Ophthalmology) Blima Ledger, OD (Optometry) Dairl Ponder, MD as Consulting Physician (Orthopedic Surgery)  Indicate any recent Medical Services you may have received from other than [facility] providers in the past year (date may be approximate).     Assessment:   This is a routine wellness examination for Teresa Patterson.  Hearing/Vision screen Hearing Screening - Comments:: Pt denies any hearing issues  Vision Screening - Comments:: Pt follows up with [facility] for annual eye exams    Dietary issues and exercise activities discussed:     Goals Addressed             This Visit's Progress    Patient Stated       Continue to stay healthy        Depression Screen    11/29/2022    9:59 AM 09/07/2022    9:02 AM 01/25/2022   10:19 AM 09/05/2021    8:30 AM 07/29/2020    9:03 AM 07/29/2019    9:02 AM 07/25/2018    9:13 AM  PHQ 2/9 Scores  PHQ - 2 Score 0 0 0 0 0 0 0    Fall Risk    11/29/2022   10:01 AM 09/07/2022    9:01 AM 01/25/2022   10:20 AM 09/05/2021    8:29 AM 07/29/2020    9:03 AM  Fall Risk   Falls in the past year? 1 0 0 0 0  Number falls in past yr: 1 0 0 0 0  Injury with Fall? 1 0 0 0 0  Comment right hip injury      Risk for fall due to : History of fall(s) No Fall Risks No Fall Risks No Fall Risks   Follow up Falls prevention discussed Falls evaluation completed Falls evaluation completed Falls evaluation completed Falls evaluation completed    MEDICARE RISK AT HOME:  Medicare Risk at Home - 11/29/22 1001     Any stairs in or around the home? Yes    If so, are there any without handrails? No    Home free of loose throw rugs in walkways, pet beds, electrical cords, etc? Yes    Adequate lighting in your home to reduce risk of falls? Yes    Life alert? No    Use of a cane, walker or w/c? No    Grab bars in the bathroom? Yes    Shower chair or bench in shower? Yes    Elevated toilet seat or a handicapped toilet? No             TIMED UP AND GO:  Was  the test performed?  No    Cognitive Function:        11/29/2022   10:02 AM 01/25/2022   10:21 AM  6CIT Screen  What Year? 0 points 0 points  What month? 0 points 0 points  What time? 0 points 0 points  Count back from 20 0 points 0 points  Months in reverse 0 points   Repeat phrase 0 points 0 points  Total Score 0 points     Immunizations Immunization History  Administered Date(s) Administered   Fluad Quad(high Dose 65+) 02/09/2022   H1N1 06/11/2008   Influenza Split  03/30/2011   Influenza Whole 06/04/2007, 02/14/2008, 06/16/2009   Influenza, High Dose Seasonal PF 02/08/2015, 01/30/2017, 02/15/2018, 02/24/2021   Influenza,inj,Quad PF,6+ Mos 02/14/2013, 01/08/2014   Influenza-Unspecified 02/24/2016, 02/15/2018, 02/20/2019, 03/03/2020   PFIZER(Purple Top)SARS-COV-2 Vaccination 06/26/2019, 07/22/2019, 02/19/2020, 02/24/2021   Pneumococcal Conjugate-13 10/22/2013   Pneumococcal Polysaccharide-23 05/17/2006   Respiratory Syncytial Virus Vaccine,Recomb Aduvanted(Arexvy) 02/09/2022   Td 03/28/2005, 07/11/2016   Zoster Recombinant(Shingrix) 10/31/2019, 12/31/2019    TDAP status: Up to date  Flu Vaccine status: Up to date  Pneumococcal vaccine status: Up to date  Covid-19 vaccine status: Completed vaccines  Qualifies for Shingles Vaccine? Yes   Zostavax completed Yes   Shingrix Completed?: Yes  Screening Tests Health Maintenance  Topic Date Due   INFLUENZA VACCINE  12/21/2022   Medicare Annual Wellness (AWV)  11/29/2023   MAMMOGRAM  02/24/2024   DEXA SCAN  02/24/2024   DTaP/Tdap/Td (3 - Tdap) 07/11/2026   Pneumonia Vaccine 27+ Years old  Completed   Zoster Vaccines- Shingrix  Completed   HPV VACCINES  Aged Out   COVID-19 Vaccine  Discontinued    Health Maintenance  There are no preventive care reminders to display for this patient.   Colorectal cancer screening: No longer required.   Mammogram status: Completed scheduled 02/26/23. Repeat every year  Bone Density status: Completed 02/23/22. Results reflect: Bone density results: OSTEOPENIA. Repeat every 2 years.   Additional Screening:   Vision Screening: Recommended annual ophthalmology exams for early detection of glaucoma and other disorders of the eye. Is the patient up to date with their annual eye exam?  Yes  Who is the provider or what is the name of the office in which the patient attends annual eye exams? [facility]  If pt is not established with a provider, would they like to  be referred to a provider to establish care? No .   Dental Screening: Recommended annual dental exams for proper oral hygiene    Community Resource Referral / Chronic Care Management: CRR required this visit?  No   CCM required this visit?  No     Plan:     I have personally reviewed and noted the following in the patient's chart:   Medical and social history Use of alcohol, tobacco or illicit drugs  Current medications and supplements including opioid prescriptions. Patient is not currently taking opioid prescriptions. Functional ability and status Nutritional status Physical activity Advanced directives List of other physicians Hospitalizations, surgeries, and ER visits in previous 12 months Vitals Screenings to include cognitive, depression, and falls Referrals and appointments  In addition, I have reviewed and discussed with patient certain preventive protocols, quality metrics, and best practice recommendations. A written personalized care plan for preventive services as well as general preventive health recommendations were provided to patient.     Marzella Schlein, LPN   1/61/0960   After Visit Summary: (MyChart) Due  to this being a telephonic visit, the after visit summary with patients personalized plan was offered to patient via MyChart   Nurse Notes: none

## 2022-12-06 ENCOUNTER — Ambulatory Visit: Payer: Medicare HMO | Admitting: Orthopaedic Surgery

## 2022-12-06 ENCOUNTER — Encounter: Payer: Self-pay | Admitting: Orthopaedic Surgery

## 2022-12-06 DIAGNOSIS — Z96641 Presence of right artificial hip joint: Secondary | ICD-10-CM

## 2022-12-06 NOTE — Progress Notes (Signed)
The patient is very active 82 year old female who is 6 weeks out from a right total hip arthroplasty to treat a displaced right hip femoral neck fracture after mechanical fall.  She is already back to her regular level of activities.  She is very active and is walking without an assistive device.  She denies any pain at all and has no concerns.  She feels like she is ready to get back on a lawnmower to lower her lawn.  On exam I can easily put her right hip through internal and external rotation with no discomfort or pain at all.  She is walking with a normal gait and her leg lengths are equal.  From my standpoint she has no restrictions.  Will see her back in 6 months with a standing low pelvis and lateral of her right operative hip.  If there are issues before then she knows to let us know.

## 2023-01-12 DIAGNOSIS — H524 Presbyopia: Secondary | ICD-10-CM | POA: Diagnosis not present

## 2023-01-17 ENCOUNTER — Telehealth: Payer: Self-pay | Admitting: Orthopaedic Surgery

## 2023-01-17 ENCOUNTER — Other Ambulatory Visit: Payer: Self-pay | Admitting: Orthopaedic Surgery

## 2023-01-17 MED ORDER — AMOXICILLIN 500 MG PO CAPS: 500.0000 mg | ORAL_CAPSULE | Freq: Four times a day (QID) | ORAL | 0 refills | Status: DC

## 2023-01-17 NOTE — Telephone Encounter (Signed)
I had written wrong sig. I called pharmacy and  told them correct sig verbally of 2 capsules prior to procedure and 2 capsules 6 hrs after procedure.

## 2023-01-17 NOTE — Telephone Encounter (Signed)
Patient is having dental work done and needs abx per Dr. Vevelyn Royals protocol.

## 2023-02-26 ENCOUNTER — Ambulatory Visit
Admission: RE | Admit: 2023-02-26 | Discharge: 2023-02-26 | Disposition: A | Discharge status: Home / Self Care | Payer: Medicare HMO | Source: Ambulatory Visit | Attending: Family Medicine | Admitting: Family Medicine

## 2023-02-26 DIAGNOSIS — Z1231 Encounter for screening mammogram for malignant neoplasm of breast: Secondary | ICD-10-CM | POA: Diagnosis not present

## 2023-03-07 ENCOUNTER — Ambulatory Visit: Payer: Medicare HMO | Admitting: Orthopaedic Surgery

## 2023-03-07 ENCOUNTER — Other Ambulatory Visit (INDEPENDENT_AMBULATORY_CARE_PROVIDER_SITE_OTHER): Payer: Medicare HMO

## 2023-03-07 ENCOUNTER — Encounter: Payer: Self-pay | Admitting: Orthopaedic Surgery

## 2023-03-07 DIAGNOSIS — M79604 Pain in right leg: Secondary | ICD-10-CM

## 2023-03-07 MED ORDER — TIZANIDINE HCL 2 MG PO TABS: 2.0000 mg | ORAL_TABLET | Freq: Three times a day (TID) | ORAL | 0 refills | Status: DC | PRN

## 2023-03-07 MED ORDER — METHYLPREDNISOLONE 4 MG PO TABS: ORAL_TABLET | ORAL | 0 refills | Status: DC

## 2023-03-07 NOTE — Progress Notes (Signed)
The patient is a pleasant and active 82 year old female that we performed a hip replacement on her right hip back in June of this year secondary to an acute femoral neck fracture.  She is very active and over the last 4 weeks has developed some pain in the anterior aspect of her thigh.  It only occurs when she is been standing for a long period time when she flexes at her hip.  She is walking without any assistive device and no significant limp.  On exam her right hip moves smoothly and fluidly.  She does have weakness with hip flexion and pain in the groin and in the anterior thigh with hip flexion.  An AP pelvis and lateral of the right hip are reviewed today.  I also compared these to her immediate postoperative films and I see no change in the implant positioning.  Everything looks good from my standpoint in terms of no acute changes or complicating features of the hardware.  I would like to try a steroid taper for her as well as a low-dose of Zanaflex.  I want her to use a cane in her opposite hand for only about 2 or 3 weeks.  We will see her back in 3 weeks for a repeat exam but no x-rays are needed.  We can also put her through physical therapy at that point if she is not getting better.  She agrees with this treatment plan.  All questions and concerns were addressed and answered.

## 2023-03-14 DIAGNOSIS — L570 Actinic keratosis: Secondary | ICD-10-CM | POA: Diagnosis not present

## 2023-03-14 DIAGNOSIS — L821 Other seborrheic keratosis: Secondary | ICD-10-CM | POA: Diagnosis not present

## 2023-03-14 DIAGNOSIS — L565 Disseminated superficial actinic porokeratosis (DSAP): Secondary | ICD-10-CM | POA: Diagnosis not present

## 2023-03-28 ENCOUNTER — Encounter: Payer: Self-pay | Admitting: Orthopaedic Surgery

## 2023-03-28 ENCOUNTER — Ambulatory Visit: Payer: Medicare HMO | Admitting: Orthopaedic Surgery

## 2023-03-28 DIAGNOSIS — M25551 Pain in right hip: Secondary | ICD-10-CM

## 2023-03-28 DIAGNOSIS — Z96641 Presence of right artificial hip joint: Secondary | ICD-10-CM

## 2023-03-28 NOTE — Progress Notes (Signed)
The patient is well-known to me.  She is 82 years old and active.  She is now almost 5 months out from a right hip replacement.  She still is continuing to have pain in the right hip with hip flexion and in the groin area.  She had done well immediate postop but then I saw her about 2 weeks ago and she was having more pain.  She does not walk with assistive device and has been no significant limp.  I had her offload her hip for the last 2 weeks with using a cane in her opposite hand and I put her on a small muscle relaxant and a steroid taper.  She said for a few days a steroid help her quite a bit but she still having some pain with hip flexion of that right hip.  She said is not severe and it does not hurt when she walks or stands but there is definitely pain.  On my exam she does have pain in the groin on the right side.  When I really flex her right hip I believe there is some irritation of the iliopsoas tendon.  I did obtain x-rays at her last visit 2 weeks ago of her pelvis and right hip in the office and saw no complicating features of the hip replacement itself.  It does not look like there is significant overhang of the acetabular component.  She does seem to be still having some irritation in her groin area of her right hip so I would like to send her to my partner Dr. Shon Baton for an ultrasound assessment of her right hip and likely a steroid injection in her right iliopsoas tendon.  She agrees with trying something to this degree.  She has an appointment to see me in January so she can keep that appointment in January unless she does not get any significant relief after seeing Dr. Shon Baton.  She agrees with this treatment plan.

## 2023-04-02 ENCOUNTER — Ambulatory Visit: Payer: Medicare HMO | Admitting: Sports Medicine

## 2023-04-02 ENCOUNTER — Other Ambulatory Visit: Payer: Self-pay

## 2023-04-02 ENCOUNTER — Encounter: Payer: Self-pay | Admitting: Sports Medicine

## 2023-04-02 DIAGNOSIS — Z96641 Presence of right artificial hip joint: Secondary | ICD-10-CM

## 2023-04-02 DIAGNOSIS — M25551 Pain in right hip: Secondary | ICD-10-CM | POA: Diagnosis not present

## 2023-04-02 NOTE — Progress Notes (Signed)
ADDALIE FURNACE - 82 y.o. female MRN [id number]  Date of birth: 26-Nov-1940  Office Visit Note: Visit Date: 04/02/2023 PCP: Natalia Leatherwood, DO Referred by: Natalia Leatherwood, DO  Subjective: Chief Complaint  Patient presents with   Right Leg - Pain   HPI: AUBREYROSE RYSER is a pleasant 82 y.o. female who presents today for right hip pain in setting of right-hip THA.  She is about 5 months out from a right total hip replacement with my partner, Dr. Magnus Ivan.  She did great on the few weeks with minimal pain.  However over the last couple weeks she has had pain over the anterior hip specifically with flexing the hip.  Pertinent ROS were reviewed with the patient and found to be negative unless otherwise specified above in HPI.   Assessment & Plan: Visit Diagnoses:  1. Pain of right hip   2. Status post total replacement of right hip    Plan: Impression is right anterior hip pain status post right THA without complicating features on x-ray.  We did ultrasound the anterior hip and I did not see any evidence of tendon abnormality or high-grade tearing.  There was some typical postsurgical changes over the anterior hip with some mild fluid but nothing within the sheath.  We did perform a diagnostic and hopefully therapeutic ultrasound-guided iliopsoas tendon sheath injection, I did check with the patient about 10 minutes from this injection and unfortunately she has not received much relief at this point.  Postinjection protocol was discussed, okay for ice/heat or Tylenol as needed.  She will give this about 10 days to see what degree of relief she gets and she will notify Dr. Magnus Ivan depending on the degree of relief.  Follow-up: Return for with Dr. Magnus Ivan R-hip.   Meds & Orders: No orders of the defined types were placed in this encounter.   Orders Placed This Encounter  Procedures   [place] Extrem Low Right Ltd     Procedures: US-guided Iliopsoas Tendon Sheath Injection, Right Hip: After  discussion on risk/benefits/indications, an informed verbal consent was obtained. A timeout was then performed. The patient was lying supine on examination table with the affected leg relaxed in neutral position. The area overlying the groin and psoas tendon was prepped with ChloraPrep and multiple alcohol swabs. The ultrasound probe was placed in an oblique plane parallel to the inguinal ligament and superior to femoral head. The overlying soft tissue was anesthesized with 3cc of lidocaine 1%. Using ultrasound guidance via an in-plane approach, a 22-gauge, 3.5" needle was inserted from a lateral to medial direction into the iliopsoas tendon sheath between the tendon and ilium. The tendon sheath was then injected with a mixture of 1:1:1cc of lidocaine:bupivicaine:celestone. Appropriate spread of the injectate within the tendon sheath was visualized with ultrasound guidance. Patient tolerated the procedure well without immediate complications.  A Band-Aid was then applied.         Clinical History: No specialty comments available.  She reports that she has never smoked. She has never used smokeless tobacco.  Recent Labs    09/07/22 0913  HGBA1C 5.5    Objective:    Physical Exam  Gen: Well-appearing, in no acute distress; non-toxic CV: Well-perfused. Warm.  Resp: Breathing unlabored on room air; no wheezing. Psych: Fluid speech in conversation; appropriate affect; normal thought process Neuro: Sensation intact throughout. No gross coordination deficits.   Ortho Exam - Right hip: Well-healed vertical incision from prior THA.  No redness swelling  or effusion.  There is pain with hip flexion both in a neutral position as well as externally rotated to about 2:00.  Imaging: [place] Extrem Low Right Ltd  Result Date: 04/02/2023 Limited musculoskeletal ultrasound of the right anterior hip was performed today.  Evaluation of the ASIS was seen without cortical regularity with proper insertion of the  sartorius muscle/tendon.  Evaluation of the AIIS shows no bony fracture or cortical displacement.  There is some insertional calcification of the rectus femoris which appears chronic in nature.  Iliopsoas tendon was seen in short and transverse views with no evidence of high-grade tearing.  There is some postsurgical change over the anterior hip with a small amount of fluid surrounding but no significant psoas sheath bursal distention. *Technically successful ultrasound-guided iliopsoas tendon sheath injection   Past Medical/Family/Surgical/Social History: Medications & Allergies reviewed per EMR, new medications updated. Patient Active Problem List   Diagnosis Date Noted   Closed right hip fracture, initial encounter (HCC) 10/23/2022   Hypokalemia 10/23/2022   Migraine, chronic, without aura 09/05/2021   Osteopenia 11/06/2019   Edema 07/25/2018   Seasonal allergies 07/25/2018   Hypothyroidism 07/11/2016   Prominent aorta 07/11/2016   Primary osteoarthritis of first carpometacarpal joint of right hand 11/25/2015   Menopausal symptom 06/04/2007   Past Medical History:  Diagnosis Date   Colon polyp    Hypothyroid    hypothyroid   Migraine    Multiple dysplastic nevi    established with Dr. Karlyn Agee dermatology   Osteopenia    Family History  Problem Relation Age of Onset   Brain cancer Brother        BRAIN TUMOR   Cancer Maternal Uncle        ?   Past Surgical History:  Procedure Laterality Date   ABDOMINAL HYSTERECTOMY  1972   CHOLECYSTECTOMY  1995   OVARIAN CYST REMOVAL Right    right   TONSILLECTOMY AND ADENOIDECTOMY  1952   TOTAL HIP ARTHROPLASTY Right 10/24/2022   Procedure: TOTAL HIP ARTHROPLASTY ANTERIOR APPROACH;  Surgeon: Kathryne Hitch, MD;  Location: [facility] OR;  Service: Orthopedics;  Laterality: Right;   Social History   Occupational History   Not on file  Tobacco Use   Smoking status: Never   Smokeless tobacco: Never  Vaping Use   Vaping status:  Never Used  Substance and Sexual Activity   Alcohol use: No    Alcohol/week: 0.0 standard drinks of alcohol   Drug use: No   Sexual activity: Not Currently    Partners: Male

## 2023-04-02 NOTE — Progress Notes (Signed)
Patient says that she began having pain on the inside of the leg at the start of September. She says that after surgery she had some physical therapy at her house where they helped with balance and walking. She says that she did have some strengthening exercises but has not been doing those recently as she was advised to let it heal. She is not taking pain medication.

## 2023-04-16 ENCOUNTER — Encounter: Payer: Self-pay | Admitting: Orthopaedic Surgery

## 2023-04-16 ENCOUNTER — Ambulatory Visit: Payer: Medicare HMO | Admitting: Orthopaedic Surgery

## 2023-04-16 ENCOUNTER — Other Ambulatory Visit: Payer: Self-pay

## 2023-04-16 DIAGNOSIS — Z96641 Presence of right artificial hip joint: Secondary | ICD-10-CM | POA: Diagnosis not present

## 2023-04-16 DIAGNOSIS — M25551 Pain in right hip: Secondary | ICD-10-CM

## 2023-04-16 NOTE — Progress Notes (Signed)
The patient is over 5 months out from a right total hip arthroplasty.  She is an active 82 year old female and has been having some hip flexor pain and weakness recently is also pain in the groin.  It does not hurt with weightbearing but she does have weakness in that hip.  I sent her to Dr. Shon Baton who did assess it under ultrasound and was able to place a steroid injection around the hip and the psoas tendon area.  She said it helped for short amount of time but she is still having the discomfort as well as pain and weakness.  On exam I can easily put her right hip through range of motion.  She walks without assist device but her hip flexors are weak and her pain is in the groin.  I would like to send her at this point outpatient physical therapy for any modalities that therapist can try to strengthen her right hip but also trying to help calm down the inflammation in the tendon issues that she is experiencing in the groin area.  Any modalities per their discretion would be warranted.  We will then have her keep her regular follow-up appoint with [place] in January.  No x-rays are needed at that visit.

## 2023-04-30 ENCOUNTER — Encounter: Payer: Self-pay | Admitting: Physical Therapy

## 2023-04-30 ENCOUNTER — Ambulatory Visit: Payer: Medicare HMO | Admitting: Physical Therapy

## 2023-04-30 DIAGNOSIS — M6281 Muscle weakness (generalized): Secondary | ICD-10-CM | POA: Diagnosis not present

## 2023-04-30 DIAGNOSIS — R262 Difficulty in walking, not elsewhere classified: Secondary | ICD-10-CM | POA: Diagnosis not present

## 2023-04-30 DIAGNOSIS — M25551 Pain in right hip: Secondary | ICD-10-CM | POA: Diagnosis not present

## 2023-04-30 NOTE — Therapy (Signed)
OUTPATIENT PHYSICAL THERAPY LOWER EXTREMITY EVALUATION   Patient Name: Teresa Patterson MRN: [id number] DOB:1941-01-19, 82 y.o., female Today's Date: 04/30/2023  END OF SESSION:  PT End of Session - 04/30/23 1429     Visit Number 1    Number of Visits 20    Date for PT Re-Evaluation 07/09/23    PT Start Time 1430    PT Stop Time 1515    PT Time Calculation (min) 45 min    Activity Tolerance Patient tolerated treatment well    Behavior During Therapy WFL for tasks assessed/performed             Past Medical History:  Diagnosis Date   Colon polyp    Hypothyroid    hypothyroid   Migraine    Multiple dysplastic nevi    established with Dr. Karlyn Agee dermatology   Osteopenia    Past Surgical History:  Procedure Laterality Date   ABDOMINAL HYSTERECTOMY  1972   CHOLECYSTECTOMY  1995   OVARIAN CYST REMOVAL Right    right   TONSILLECTOMY AND ADENOIDECTOMY  1952   TOTAL HIP ARTHROPLASTY Right 10/24/2022   Procedure: TOTAL HIP ARTHROPLASTY ANTERIOR APPROACH;  Surgeon: Kathryne Hitch, MD;  Location: [facility] OR;  Service: Orthopedics;  Laterality: Right;   Patient Active Problem List   Diagnosis Date Noted   Closed right hip fracture, initial encounter (HCC) 10/23/2022   Hypokalemia 10/23/2022   Migraine, chronic, without aura 09/05/2021   Osteopenia 11/06/2019   Edema 07/25/2018   Seasonal allergies 07/25/2018   Hypothyroidism 07/11/2016   Prominent aorta 07/11/2016   Primary osteoarthritis of first carpometacarpal joint of right hand 11/25/2015   Menopausal symptom 06/04/2007    PCP: Natalia Leatherwood, DO   REFERRING PROVIDER: Kathryne Hitch*   REFERRING DIAG:  M25.551 (ICD-10-CM) - Pain of right hip  Z96.641 (ICD-10-CM) - Status post total replacement of right hip    THERAPY DIAG:  Pain in right hip  Muscle weakness (generalized)  Difficulty in walking, not elsewhere classified  Rationale for Evaluation and Treatment: Rehabilitation  ONSET  DATE:    SUBJECTIVE:   SUBJECTIVE STATEMENT: Pt arriving for evaluation s/p Rt THA over 5 months ago. Pt amb without assist device but still presents with weakness in her Rt hip musculature. Pt did see Dr. Shon Baton and underwent ultrasound guided steroid injection in psoas tendon. (04/02/23).  Pt now reporting no pain over the last week and pt stating she is no longer having difficulty lifting her leg to get in and out of her bed and car.   PERTINENT HISTORY: Osteopenia, abdominal hysterectomy, ovarian cyst removal, migraines, hypothyroid Total hip arthroplasty Rt 10/24/22   PAIN:  NPRS scale: none   PRECAUTIONS: None  WEIGHT BEARING RESTRICTIONS: No  FALLS:  Has patient fallen in last 6 months? No  LIVING ENVIRONMENT: Lives with: lives with their family Lives in: House/apartment Stairs: Yes: Internal: 15 steps; on right going up Has following equipment at home: None  OCCUPATION: retired  PLOF: Independent  PATIENT GOALS: not to need therapy  Next MD visit:   OBJECTIVE:   DIAGNOSTIC FINDINGS: An AP pelvis and lateral right hip shows a well-seated right total hip  arthroplasty with no complicating features.  Compared to immediate  postoperative films there is no change.   PATIENT SURVEYS:  04/30/23: FOTO intake:  96%   COGNITION: Overall cognitive status: WFL    SENSATION: WFL    POSTURE:  rounded shoulders and forward head  PALPATION: No  pain with palpation of Rt hip  LOWER EXTREMITY ROM:   ROM Right 04/30/23 Left 04/30/23  Hip flexion 135 136  Hip extension    Hip abduction    Hip adduction    Hip internal rotation 40 42  Hip external rotation 52 55  Knee flexion 145 142  Knee extension    Ankle dorsiflexion    Ankle plantarflexion    Ankle inversion    Ankle eversion     (Blank rows = not tested)  LOWER EXTREMITY MMT:  MMT Right 04/30/23 Left 04/30/23  Hip flexion 4 4  Hip extension    Hip abduction 5 5  Hip adduction 5 5  Hip  internal rotation    Hip external rotation    Knee flexion 5 5  Knee extension 5 5  Ankle dorsiflexion    Ankle plantarflexion    Ankle inversion    Ankle eversion     (Blank rows = not tested)   FUNCTIONAL TESTS:  04/30/23:  5 time sit to stand: 13.6 seconds no UE support   GAIT: Distance walked: clinic surfaces  Assistive device utilized: None Level of assistance: Complete Independence Comments: WFL                                                                                                                                                                        TODAY'S TREATMENT                                                                          DATE: 04/30/23 Therex: HEP instruction/performance c cues for techniques, handout provided.  Trial set performed of each for comprehension and symptom assessment.  See below for exercise list  PATIENT EDUCATION:  Education details: HEP, POC Person educated: Patient Education method: Explanation, Demonstration, Verbal cues, and Handouts Education comprehension: verbalized understanding, returned demonstration, and verbal cues required  HOME EXERCISE PROGRAM: Access Code: [email or web address] URL: [email or web address] Date: 04/30/2023 Prepared by: Narda Amber  Exercises - Supine Bridge  - 1 x daily - 7 x weekly - 2 sets - 10 reps - 5 seconds hold - Supine Active Straight Leg Raise  - 1 x daily - 7 x weekly - 2 sets - 10 reps - Straight Leg Raise with External Rotation  - 1 x daily - 7 x weekly - 2 sets - 10 reps - Standing Gastroc Stretch  - 1 x daily - 7 x weekly - 3 reps -  30 seconds hold  ASSESSMENT:  CLINICAL IMPRESSION: Patient is a 82 y.o. who comes to clinic with complaints of right hip pain s/p  Rt THA on 10/24/22 following her injection. Pt presenting today reporting no pain in her Rt hip.Pt with normal ROM with mild hip flexor weakness. Pt was edu in a home exercise program. I feel skilled PT is  recommended at present time following pt's evaluation. Pt has a follow up with Dr. Magnus Ivan in January. Pt was instructed to follow up with therapy or her physician if concerns arise.   OBJECTIVE IMPAIRMENTS: mild weakness in bil hip flexor muscles  ACTIVITY LIMITATIONS:  none per pt report  PARTICIPATION LIMITATIONS:  none reported at present time  PERSONAL FACTORS:  see pertinent history above  are also affecting patient's functional outcome.   REHAB POTENTIAL: Good  CLINICAL DECISION MAKING: Stable/uncomplicated  EVALUATION COMPLEXITY: Low   GOALS: Goals reviewed with patient? Yes  SHORT TERM GOALS:  1 visit   1.  Patient will demonstrate independent use of home exercise program to maintain progress from in clinic treatments.  Goal status: HEP demonstrated on Eval, goal met     PLAN:  PT FREQUENCY: 1 visit  PT DURATION: 1 visit  PLANNED INTERVENTIONS: Can include 60630- PT Re-evaluation, 97110-Therapeutic exercises, 97530- Therapeutic activity, 97112- Neuromuscular re-education, 97535- Self Care, 97140- Manual therapy, [phone]- Gait training, [phone]- Orthotic Fit/training, [phone]- Canalith repositioning, [id number]- Aquatic Therapy, 97014- Electrical stimulation (unattended), [id number]- Electrical stimulation (manual), [id number]- Vasopneumatic device, [id number]- Ultrasound, [id number]- Traction (mechanical), [id number]- Ionotophoresis 4mg /ml Dexamethasone, Patient/Family education, Balance training, Stair training, Taping, Dry Needling, Joint mobilization, Joint manipulation, Spinal manipulation, Spinal mobilization, Scar mobilization, Vestibular training, Visual/preceptual remediation/compensation, DME instructions, Cryotherapy, and Moist heat.  All performed as medically necessary.  All included unless contraindicated       Sharmon Leyden, PT, MPT 04/30/2023, 2:30 PM

## 2023-05-12 DIAGNOSIS — R051 Acute cough: Secondary | ICD-10-CM | POA: Diagnosis not present

## 2023-05-12 DIAGNOSIS — J209 Acute bronchitis, unspecified: Secondary | ICD-10-CM | POA: Diagnosis not present

## 2023-06-07 ENCOUNTER — Ambulatory Visit: Payer: Medicare HMO | Admitting: Orthopaedic Surgery

## 2023-06-07 ENCOUNTER — Encounter: Payer: Self-pay | Admitting: Orthopaedic Surgery

## 2023-06-07 DIAGNOSIS — Z96641 Presence of right artificial hip joint: Secondary | ICD-10-CM | POA: Diagnosis not present

## 2023-06-07 NOTE — Progress Notes (Signed)
The patient is an 83 year old female who we replaced her right hip in June of last year secondary to a femoral neck fracture.  When we saw her for last visit several months ago the x-rays look great but she was having still a lot of pain in her hip and we sent her to physical therapy.  She comes in today looking great.  She is walking without assist device.  She reports only some numbness but denies any pain.  She says she is doing very well and has good range of motion and strength.  She actually appears much younger than 25.  On exam her right hip is smoothly and fluidly and he cannot really tell she has a hip replacement at all.  She looks great.  She says her ADLs are easier to perform as well.  She can continue to increase her activities as comfort allows.  Will see her back at the 1 year standpoint from surgery with a standing AP pelvis and lateral of her right operative hip.  If there are issues before then she knows to reach out to [place].

## 2023-09-11 ENCOUNTER — Ambulatory Visit: Payer: Medicare HMO | Admitting: Family Medicine

## 2023-09-11 ENCOUNTER — Encounter: Payer: Self-pay | Admitting: Family Medicine

## 2023-09-11 VITALS — BP 124/80 | HR 68 | Temp 98.0°F | Ht 63.5 in | Wt 127.4 lb

## 2023-09-11 DIAGNOSIS — Z131 Encounter for screening for diabetes mellitus: Secondary | ICD-10-CM

## 2023-09-11 DIAGNOSIS — R609 Edema, unspecified: Secondary | ICD-10-CM

## 2023-09-11 DIAGNOSIS — N951 Menopausal and female climacteric states: Secondary | ICD-10-CM | POA: Diagnosis not present

## 2023-09-11 DIAGNOSIS — M949 Disorder of cartilage, unspecified: Secondary | ICD-10-CM | POA: Diagnosis not present

## 2023-09-11 DIAGNOSIS — Z1231 Encounter for screening mammogram for malignant neoplasm of breast: Secondary | ICD-10-CM

## 2023-09-11 DIAGNOSIS — E034 Atrophy of thyroid (acquired): Secondary | ICD-10-CM | POA: Diagnosis not present

## 2023-09-11 DIAGNOSIS — M8580 Other specified disorders of bone density and structure, unspecified site: Secondary | ICD-10-CM

## 2023-09-11 DIAGNOSIS — G43701 Chronic migraine without aura, not intractable, with status migrainosus: Secondary | ICD-10-CM

## 2023-09-11 DIAGNOSIS — Z13220 Encounter for screening for lipoid disorders: Secondary | ICD-10-CM

## 2023-09-11 DIAGNOSIS — M899 Disorder of bone, unspecified: Secondary | ICD-10-CM | POA: Diagnosis not present

## 2023-09-11 DIAGNOSIS — Z0000 Encounter for general adult medical examination without abnormal findings: Secondary | ICD-10-CM

## 2023-09-11 LAB — CBC
HCT: 38.8 % (ref 36.0–46.0)
Hemoglobin: 12.9 g/dL (ref 12.0–15.0)
MCHC: 33.2 g/dL (ref 30.0–36.0)
MCV: 90.6 fl (ref 78.0–100.0)
Platelets: 202 10*3/uL (ref 150.0–400.0)
RBC: 4.28 Mil/uL (ref 3.87–5.11)
RDW: 15.2 % (ref 11.5–15.5)
WBC: 6.1 10*3/uL (ref 4.0–10.5)

## 2023-09-11 LAB — TSH: TSH: 4.85 u[IU]/mL (ref 0.35–5.50)

## 2023-09-11 LAB — LIPID PANEL
Cholesterol: 161 mg/dL (ref 0–200)
HDL: 70.3 mg/dL (ref >39.00)
LDL Cholesterol: 74 mg/dL (ref 0–99)
NonHDL: 90.34
Total CHOL/HDL Ratio: 2
Triglycerides: 83 mg/dL (ref 0.0–149.0)
VLDL: 16.6 mg/dL (ref 0.0–40.0)

## 2023-09-11 LAB — COMPREHENSIVE METABOLIC PANEL WITH GFR
ALT: 8 U/L (ref 0–35)
AST: 14 U/L (ref 0–37)
Albumin: 4.4 g/dL (ref 3.5–5.2)
Alkaline Phosphatase: 50 U/L (ref 39–117)
BUN: 15 mg/dL (ref 6–23)
CO2: 31 meq/L (ref 19–32)
Calcium: 10 mg/dL (ref 8.4–10.5)
Chloride: 102 meq/L (ref 96–112)
Creatinine, Ser: 0.77 mg/dL (ref 0.40–1.20)
GFR: 71.4 mL/min (ref >60.00)
Glucose, Bld: 87 mg/dL (ref 70–99)
Potassium: 4.2 meq/L (ref 3.5–5.1)
Sodium: 140 meq/L (ref 135–145)
Total Bilirubin: 0.6 mg/dL (ref 0.2–1.2)
Total Protein: 6.6 g/dL (ref 6.0–8.3)

## 2023-09-11 LAB — HEMOGLOBIN A1C: Hgb A1c MFr Bld: 5.6 % (ref 4.6–6.5)

## 2023-09-11 LAB — VITAMIN D 25 HYDROXY (VIT D DEFICIENCY, FRACTURES): VITD: 45.09 ng/mL (ref 30.00–100.00)

## 2023-09-11 MED ORDER — SUMATRIPTAN 5 MG/ACT NA SOLN: 1.0000 | NASAL | 3 refills | Status: AC | PRN

## 2023-09-11 MED ORDER — ESTROGENS CONJUGATED 0.625 MG/GM VA CREA: TOPICAL_CREAM | VAGINAL | 12 refills | Status: AC

## 2023-09-11 NOTE — Patient Instructions (Addendum)

## 2023-09-11 NOTE — Progress Notes (Signed)
 Patient ID: Teresa Patterson, female  DOB: September 05, 1940, 83 y.o.   MRN: [id number] Patient Care Team    Relationship Specialty Notifications Start End  Mariel Shope, DO PCP - General Family Medicine  10/23/22   Drusilla Gerlach, MD Consulting Physician Dermatology  03/14/18   Serafin Dames, MD Consulting Physician Gastroenterology  03/14/18   Corie Diamond, MD Consulting Physician Ophthalmology  03/14/18   Princella Brooklyn, OD  Optometry  03/14/18   Florida Hurter, MD Consulting Physician Orthopedic Surgery  03/14/18     Chief Complaint  Patient presents with   Annual Exam    Chronic Conditions/illness Management Pt is fasting.     Subjective:  Teresa Patterson is a 83 y.o.  Female  present for CPE and Chronic Conditions/illness Management  All past medical history, surgical history, allergies, family history, immunizations, medications and social history were updated in the electronic medical record today. All recent labs, ED visits and hospitalizations within the last year were reviewed.  Health maintenance:  Mammogram: completed: 02/26/2023- [facility] ordered tfor 2025 Immunizations: tdap UTD 2018, Influenza 2024 UTD (encouraged yearly), PNA series completed (after 65), shingrix completed.  DEXA: 02/23/2022 > (-2.4-osteopenia). Rpt 2 yrs Assistive device: none Oxygen YQM:VHQI Patient has a Dental home. Hospitalizations/ED visits: reviewed  Hypothyroidism, unspecified type Patient reports compliance with levothyroxine  100 mcg daily     Osteoporosis, unspecified osteoporosis type, unspecified pathological fracture presence DEXA 2023 with osteopenia -2.4 T-score   postmenopausal changes:  Patient reports her symptoms are stable with Premarin  cream 2 times a week.   migraine without status migrainosus, not intractable Patient reports her migraines are stable, usually suffers from a couple of migraines a year.  Uses Imitrex  in those cases-which works well    Edema:  Patient reports her edema is well-controlled she uses HCTZ 25 mg daily.  Potassium has been stable      11/29/2022    9:59 AM 09/07/2022    9:02 AM 01/25/2022   10:19 AM 09/05/2021    8:30 AM 07/29/2020    9:03 AM  Depression screen PHQ 2/9  Decreased Interest 0 0 0 0 0  Down, Depressed, Hopeless 0 0 0 0 0  PHQ - 2 Score 0 0 0 0 0       No data to display          Immunization History  Administered Date(s) Administered   Fluad Quad(high Dose 65+) 02/09/2022   H1N1 06/11/2008   Influenza Split 03/30/2011   Influenza Whole 06/04/2007, 02/14/2008, 06/16/2009   Influenza, High Dose Seasonal PF 02/08/2015, 01/30/2017, 02/15/2018, 02/24/2021, 02/03/2023   Influenza,inj,Quad PF,6+ Mos 02/14/2013, 01/08/2014   Influenza-Unspecified 02/24/2016, 02/15/2018, 02/20/2019, 03/03/2020   PFIZER(Purple Top)SARS-COV-2 Vaccination 06/26/2019, 07/22/2019, 02/19/2020, 02/24/2021   PNEUMOCOCCAL CONJUGATE-20 03/28/2023   Pfizer(Comirnaty)Fall Seasonal Vaccine 12 years and older 02/03/2023   Pneumococcal Conjugate-13 10/22/2013   Pneumococcal Polysaccharide-23 05/17/2006   Respiratory Syncytial Virus Vaccine,Recomb Aduvanted(Arexvy) 02/09/2022   Td 03/28/2005, 07/11/2016   Zoster Recombinant(Shingrix) 10/31/2019, 12/31/2019     Past Medical History:  Diagnosis Date   Colon polyp    Hypothyroid    hypothyroid   Migraine    Multiple dysplastic nevi    established with Dr. Marcia Setters dermatology   Osteopenia    No Known Allergies  Past Surgical History:  Procedure Laterality Date   ABDOMINAL HYSTERECTOMY  1972   CHOLECYSTECTOMY  1995   OVARIAN CYST REMOVAL Right    right   TONSILLECTOMY AND ADENOIDECTOMY  1952   TOTAL HIP ARTHROPLASTY Right 10/24/2022   Procedure: TOTAL HIP ARTHROPLASTY ANTERIOR APPROACH;  Surgeon: Arnie Lao, MD;  Location: [facility] OR;  Service: Orthopedics;  Laterality: Right;   Family History  Problem Relation Age of Onset   Brain cancer Brother         BRAIN TUMOR   Cancer Maternal Uncle        ?   Social History   Social History Narrative   Marital status/children/pets: Widowed. 3 children. Lives alone.    Education/employment: GED, retired.    Safety:      -smoke alarm in the home:Yes     - wears seatbelt: Yes     - Feels safe in their surroundings: Yes    Allergies as of 09/11/2023   No Known Allergies      Medication List        Accurate as of September 11, 2023  9:11 AM. If you have any questions, ask your nurse or doctor.          STOP taking these medications    amoxicillin  500 MG capsule Commonly known as: AMOXIL  Stopped by: Napolean Backbone   BENEFIBER DRINK MIX PO Stopped by: Napolean Backbone   methylPREDNISolone  4 MG tablet Commonly known as: Medrol  Stopped by: Napolean Backbone   oxyCODONE  5 MG immediate release tablet Commonly known as: Oxy IR/ROXICODONE  Stopped by: Napolean Backbone   senna-docusate 8.6-50 MG tablet Commonly known as: Senokot-S Stopped by: Napolean Backbone   tiZANidine  2 MG tablet Commonly known as: ZANAFLEX  Stopped by: Napolean Backbone       TAKE these medications    aspirin  81 MG chewable tablet Chew 1 tablet (81 mg total) by mouth 2 (two) times daily.   Co Q 10 100 MG Caps Take 1 capsule by mouth every evening.   conjugated estrogens  vaginal cream Commonly known as: PREMARIN  1 applicator of cream  intravaginally 1-2 weekly qhs What changed:  how much to take how to take this when to take this additional instructions   cyanocobalamin  1000 MCG tablet Take 1 tablet (1,000 mcg total) by mouth daily.   fluticasone  50 MCG/ACT nasal spray Commonly known as: FLONASE  Place 2 sprays into both nostrils daily. What changed:  when to take this reasons to take this   hydrochlorothiazide  25 MG tablet Commonly known as: HYDRODIURIL  Take 25 mg by mouth daily.   levothyroxine  100 MCG tablet Commonly known as: SYNTHROID  Take 1 tablet (100 mcg total) by mouth daily.   multivitamin  tablet Take 1 tablet by mouth every evening.   oxybutynin  10 MG 24 hr tablet Commonly known as: DITROPAN -XL Take 10 mg by mouth daily.   SUMAtriptan  5 MG/ACT nasal spray Commonly known as: IMITREX  Place 1 spray (5 mg total) into the nose every 2 (two) hours as needed. What changed: reasons to take this        All past medical history, surgical history, allergies, family history, immunizations andmedications were updated in the EMR today and reviewed under the history and medication portions of their EMR.     No results found for this or any previous visit (from the past 2160 hours).  ROS 14 pt review of systems performed and negative (unless mentioned in an HPI)  Objective: BP 124/80   Pulse 68   Temp 98 F (36.7 C)   Ht 5' 3.5" (1.613 m)   Wt 127 lb 6.4 oz (57.8 kg)   SpO2 96%   BMI 22.21 kg/m  Physical  Exam Vitals and nursing note reviewed.  Constitutional:      General: She is not in acute distress.    Appearance: Normal appearance. She is not ill-appearing or toxic-appearing.  HENT:     Head: Normocephalic and atraumatic.     Right Ear: Tympanic membrane, ear canal and external ear normal. There is no impacted cerumen.     Left Ear: Tympanic membrane, ear canal and external ear normal. There is no impacted cerumen.     Nose: No congestion or rhinorrhea.     Mouth/Throat:     Mouth: Mucous membranes are moist.     Pharynx: Oropharynx is clear. No oropharyngeal exudate or posterior oropharyngeal erythema.  Eyes:     General: No scleral icterus.       Right eye: No discharge.        Left eye: No discharge.     Extraocular Movements: Extraocular movements intact.     Conjunctiva/sclera: Conjunctivae normal.     Pupils: Pupils are equal, round, and reactive to light.  Cardiovascular:     Rate and Rhythm: Normal rate and regular rhythm.     Pulses: Normal pulses.     Heart sounds: Normal heart sounds. No murmur heard.    No friction rub. No gallop.  Pulmonary:      Effort: Pulmonary effort is normal. No respiratory distress.     Breath sounds: Normal breath sounds. No stridor. No wheezing, rhonchi or rales.  Chest:     Chest wall: No tenderness.  Abdominal:     General: Abdomen is flat. Bowel sounds are normal. There is no distension.     Palpations: Abdomen is soft. There is no mass.     Tenderness: There is no abdominal tenderness. There is no right CVA tenderness, left CVA tenderness, guarding or rebound.     Hernia: No hernia is present.  Musculoskeletal:        General: No swelling, tenderness or deformity. Normal range of motion.     Cervical back: Normal range of motion and neck supple. No rigidity or tenderness.     Right lower leg: No edema.     Left lower leg: No edema.  Lymphadenopathy:     Cervical: No cervical adenopathy.  Skin:    General: Skin is warm and dry.     Coloration: Skin is not jaundiced or pale.     Findings: No bruising, erythema, lesion or rash.  Neurological:     General: No focal deficit present.     Mental Status: She is alert and oriented to person, place, and time. Mental status is at baseline.     Cranial Nerves: No cranial nerve deficit.     Sensory: No sensory deficit.     Motor: No weakness.     Coordination: Coordination normal.     Gait: Gait normal.     Deep Tendon Reflexes: Reflexes normal.  Psychiatric:        Mood and Affect: Mood normal.        Behavior: Behavior normal.        Thought Content: Thought content normal.        Judgment: Judgment normal.      No results found.  Assessment/plan: SYLVANNA BURGGRAF is a 83 y.o. female present for annual physical and chronic conditions management  edema, unspecified type Stable Continue HCTZ 25 mg CMP collected   Hypothyroidism, unspecified type Stable Continue levothyroxine  100 mcg qd.  Refills will be provided in appropriate dose  after lab results received  Osteopenia, unspecified location Continue vitamin D  supplementation. Bone  density completed 2023, repeat 2 years -2.4 T-score Vitamin D  collected today DEXA ordered for October 2025  Chronic migraine without aura with status migrainosus, not intractable Stable Continue imitrex  prn  Menopausal symptom Stable Continue Premarin  cream 1-2 times a week  Seasonal allergies/hoarseness Stable Continue flonase  PRN Continue allegra daily Established with ENT for her concerns about cancer. Husband had throat cancer.   Routine general medical examination at a health care facility Patient was encouraged to exercise greater than 150 minutes a week. Patient was encouraged to choose a diet filled with fresh fruits and vegetables, and lean meats. AVS provided to patient today for education/recommendation on gender specific health and safety maintenance. Mammogram: completed: 02/26/2023- [facility] center[facility] ordered tfor 2025 Immunizations: tdap UTD 2018, Influenza 2024 UTD (encouraged yearly), PNA series completed (after 65), shingrix completed.  DEXA: 02/23/2022 > (-2.4-osteopenia). Rpt 2 yrs  Return in about 1 year (around 09/11/2024) for cpe (20 min), Routine chronic condition follow-up.  Orders Placed This Encounter  Procedures   MM 3D SCREENING MAMMOGRAM BILATERAL BREAST   DG Bone Density   CBC   Comprehensive metabolic panel with GFR   Hemoglobin A1c   Lipid panel   TSH   Vitamin D  (25 hydroxy)   Meds ordered this encounter  Medications   SUMAtriptan  (IMITREX ) 5 MG/ACT nasal spray    Sig: Place 1 spray (5 mg total) into the nose every 2 (two) hours as needed.    Dispense:  6 each    Refill:  3   conjugated estrogens  (PREMARIN ) vaginal cream    Sig: 1 applicator of cream  intravaginally 1-2 weekly qhs    Dispense:  42.5 g    Refill:  12    Please hold script. Please do not fill until pt request.   Referral Orders  No referral(s) requested today     Electronically signed by: Napolean Backbone, DO [facility]

## 2023-09-12 ENCOUNTER — Other Ambulatory Visit: Payer: Self-pay

## 2023-09-12 ENCOUNTER — Inpatient Hospital Stay (HOSPITAL_COMMUNITY)
Admission: EM | Admit: 2023-09-12 | Discharge: 2023-09-15 | DRG: 536 | Disposition: A | Discharge status: Home Health | Attending: Internal Medicine | Admitting: Internal Medicine

## 2023-09-12 ENCOUNTER — Other Ambulatory Visit: Payer: Self-pay | Admitting: Family Medicine

## 2023-09-12 ENCOUNTER — Encounter: Payer: Self-pay | Admitting: Family Medicine

## 2023-09-12 ENCOUNTER — Emergency Department (HOSPITAL_COMMUNITY)

## 2023-09-12 DIAGNOSIS — S72141A Displaced intertrochanteric fracture of right femur, initial encounter for closed fracture: Secondary | ICD-10-CM | POA: Diagnosis present

## 2023-09-12 DIAGNOSIS — Z860100 Personal history of colon polyps, unspecified: Secondary | ICD-10-CM

## 2023-09-12 DIAGNOSIS — D649 Anemia, unspecified: Secondary | ICD-10-CM | POA: Diagnosis present

## 2023-09-12 DIAGNOSIS — Z7982 Long term (current) use of aspirin: Secondary | ICD-10-CM

## 2023-09-12 DIAGNOSIS — I517 Cardiomegaly: Secondary | ICD-10-CM

## 2023-09-12 DIAGNOSIS — Y9389 Activity, other specified: Secondary | ICD-10-CM

## 2023-09-12 DIAGNOSIS — Z471 Aftercare following joint replacement surgery: Secondary | ICD-10-CM | POA: Diagnosis not present

## 2023-09-12 DIAGNOSIS — I119 Hypertensive heart disease without heart failure: Secondary | ICD-10-CM | POA: Diagnosis present

## 2023-09-12 DIAGNOSIS — E039 Hypothyroidism, unspecified: Secondary | ICD-10-CM | POA: Diagnosis present

## 2023-09-12 DIAGNOSIS — R600 Localized edema: Secondary | ICD-10-CM | POA: Diagnosis not present

## 2023-09-12 DIAGNOSIS — S72111A Displaced fracture of greater trochanter of right femur, initial encounter for closed fracture: Secondary | ICD-10-CM | POA: Diagnosis not present

## 2023-09-12 DIAGNOSIS — S72114A Nondisplaced fracture of greater trochanter of right femur, initial encounter for closed fracture: Secondary | ICD-10-CM | POA: Diagnosis not present

## 2023-09-12 DIAGNOSIS — W19XXXA Unspecified fall, initial encounter: Secondary | ICD-10-CM | POA: Diagnosis present

## 2023-09-12 DIAGNOSIS — Z9049 Acquired absence of other specified parts of digestive tract: Secondary | ICD-10-CM

## 2023-09-12 DIAGNOSIS — E034 Atrophy of thyroid (acquired): Secondary | ICD-10-CM

## 2023-09-12 DIAGNOSIS — Z808 Family history of malignant neoplasm of other organs or systems: Secondary | ICD-10-CM

## 2023-09-12 DIAGNOSIS — R001 Bradycardia, unspecified: Secondary | ICD-10-CM | POA: Diagnosis not present

## 2023-09-12 DIAGNOSIS — Z79899 Other long term (current) drug therapy: Secondary | ICD-10-CM

## 2023-09-12 DIAGNOSIS — M810 Age-related osteoporosis without current pathological fracture: Secondary | ICD-10-CM | POA: Diagnosis present

## 2023-09-12 DIAGNOSIS — M1711 Unilateral primary osteoarthritis, right knee: Secondary | ICD-10-CM | POA: Diagnosis not present

## 2023-09-12 DIAGNOSIS — S72001A Fracture of unspecified part of neck of right femur, initial encounter for closed fracture: Secondary | ICD-10-CM | POA: Diagnosis not present

## 2023-09-12 DIAGNOSIS — Z8781 Personal history of (healed) traumatic fracture: Secondary | ICD-10-CM

## 2023-09-12 DIAGNOSIS — Y998 Other external cause status: Secondary | ICD-10-CM

## 2023-09-12 DIAGNOSIS — Z96641 Presence of right artificial hip joint: Secondary | ICD-10-CM | POA: Diagnosis not present

## 2023-09-12 DIAGNOSIS — Y92017 Garden or yard in single-family (private) house as the place of occurrence of the external cause: Secondary | ICD-10-CM

## 2023-09-12 DIAGNOSIS — M9701XA Periprosthetic fracture around internal prosthetic right hip joint, initial encounter: Secondary | ICD-10-CM | POA: Diagnosis present

## 2023-09-12 DIAGNOSIS — Z90710 Acquired absence of both cervix and uterus: Secondary | ICD-10-CM

## 2023-09-12 DIAGNOSIS — Z79890 Hormone replacement therapy (postmenopausal): Secondary | ICD-10-CM

## 2023-09-12 LAB — BASIC METABOLIC PANEL WITH GFR
Anion gap: 9 (ref 5–15)
BUN: 17 mg/dL (ref 8–23)
CO2: 26 mmol/L (ref 22–32)
Calcium: 10.4 mg/dL — ABNORMAL HIGH (ref 8.9–10.3)
Chloride: 104 mmol/L (ref 98–111)
Creatinine, Ser: 0.89 mg/dL (ref 0.44–1.00)
GFR, Estimated: >60 mL/min (ref >60)
Glucose, Bld: 114 mg/dL — ABNORMAL HIGH (ref 70–99)
Potassium: 3.6 mmol/L (ref 3.5–5.1)
Sodium: 139 mmol/L (ref 135–145)

## 2023-09-12 LAB — CBC WITH DIFFERENTIAL/PLATELET
Abs Immature Granulocytes: 0.07 10*3/uL (ref 0.00–0.07)
Basophils Absolute: 0.1 10*3/uL (ref 0.0–0.1)
Basophils Relative: 1 %
Eosinophils Absolute: 0 10*3/uL (ref 0.0–0.5)
Eosinophils Relative: 0 %
HCT: 35.6 % — ABNORMAL LOW (ref 36.0–46.0)
Hemoglobin: 11.5 g/dL — ABNORMAL LOW (ref 12.0–15.0)
Immature Granulocytes: 1 %
Lymphocytes Relative: 15 %
Lymphs Abs: 1.9 10*3/uL (ref 0.7–4.0)
MCH: 29.6 pg (ref 26.0–34.0)
MCHC: 32.3 g/dL (ref 30.0–36.0)
MCV: 91.5 fL (ref 80.0–100.0)
Monocytes Absolute: 0.8 10*3/uL (ref 0.1–1.0)
Monocytes Relative: 6 %
Neutro Abs: 9.7 10*3/uL — ABNORMAL HIGH (ref 1.7–7.7)
Neutrophils Relative %: 77 %
Platelets: 185 10*3/uL (ref 150–400)
RBC: 3.89 MIL/uL (ref 3.87–5.11)
RDW: 14.4 % (ref 11.5–15.5)
WBC: 12.7 10*3/uL — ABNORMAL HIGH (ref 4.0–10.5)
nRBC: 0 % (ref 0.0–0.2)

## 2023-09-12 LAB — PROTIME-INR
INR: 1.1 (ref 0.8–1.2)
Prothrombin Time: 14.7 s (ref 11.4–15.2)

## 2023-09-12 LAB — TYPE AND SCREEN
ABO/RH(D): O POS
Antibody Screen: NEGATIVE

## 2023-09-12 MED ORDER — SODIUM CHLORIDE 0.9 % IV SOLN: Freq: Once | INTRAVENOUS | Status: DC

## 2023-09-12 MED ORDER — LEVOTHYROXINE SODIUM 100 MCG PO TABS: 100.0000 ug | ORAL_TABLET | Freq: Every day | ORAL | 4 refills | Status: AC

## 2023-09-12 MED ORDER — HYDROMORPHONE HCL 1 MG/ML IJ SOLN: 0.5000 mg | INTRAMUSCULAR | Status: DC | PRN

## 2023-09-12 MED ORDER — OXYCODONE-ACETAMINOPHEN 5-325 MG PO TABS
1.0000 | ORAL_TABLET | ORAL | Status: DC | PRN
  Administered 2023-09-12 – 2023-09-15 (×8): 1 via ORAL
  Filled 2023-09-12 (×8): qty 1

## 2023-09-12 MED ORDER — HYDROCHLOROTHIAZIDE 25 MG PO TABS: 25.0000 mg | ORAL_TABLET | Freq: Every day | ORAL | 4 refills | Status: AC

## 2023-09-12 NOTE — Subjective & Objective (Signed)
 Presents today via for fall was out in the yard playing with her grandson around 4 PM and fell onto right side.  Unable to ambulate on that side since no other injuries.  Did not hit her head no LOC not on blood thinners She has a history of hip fracture on the same side about a year ago Patient with known history of osteoporosis and hypothyroidism

## 2023-09-12 NOTE — H&P (Incomplete)
 Teresa Patterson:[id number] DOB: 08-01-1940 DOA: 09/12/2023     PCP: Mariel Shope, DO   Outpatient Specialists: * NONE CARDS: * Dr. None  NEphrology: *  Dr. No care team member to display  NEurology *   Dr. Pulmonary *  Dr.  Oncology * Dr.No care team member to display Orthopedics Dr. Lucienne Ryder GI  Dr.  Serafin Dames, MD    Patient arrived to ER on 09/12/23 at 1809 Referred by Attending Carin Charleston, MD   Patient coming from:    home Lives alone,   *** With family     Chief Complaint:  Chief Complaint  Patient presents with  . Hip Pain    HPI: Teresa Patterson is a 83 y.o. female with medical history significant of hypothyroidism right hip fracture status post replacement, migraine, osteoporosis    Presented with mechanical fall onto right hip  Presents today via for fall was out in the yard playing with her grandson around 4 PM and fell onto right side.  Unable to ambulate on that side since no other injuries.  Did not hit her head no LOC not on blood thinners She has a history of hip fracture on the same side about a year ago Patient with known history of osteoporosis and hypothyroidism   DEXA: 02/23/2022 > (-2.4-osteopenia). Rpt 2 yrs   Denies significant ETOH intake *** Does not smoke*** but interested in quitting***  No results found for: "SARSCOV2NAA"      Regarding pertinent Chronic problems:      HTN on hydrochlorothiazide         Hypothyroidism:   Lab Results  Component Value Date   TSH 4.85 09/11/2023   on synthroid    *** COPD - not **followed by pulmonology *** not  on baseline oxygen  *L,      *** Hx of CVA - *with/out residual deficits on Aspirin  81 mg, 325, Plavix        *** Dementia - on Aricept** Nemenda  *** Chronic anemia - baseline hg Hemoglobin & Hematocrit  Recent Labs    10/27/22 0838 09/11/23 0857 09/12/23 1940  HGB 10.2* 12.9 11.5*   Iron/TIBC/Ferritin/ %Sat    Component Value Date/Time   IRON 8 (L)  10/26/2022 0048   TIBC 302 10/26/2022 0048   FERRITIN 77 10/26/2022 0048   IRONPCTSAT 3 (L) 10/26/2022 0048      While in ER:     Imaging showing greater trochanteric fracture ER provider discussed case with Dr. Sulema Endo of orthopedics Recommended CT scan to evaluate for periprosthetic fractures    Lab Orders         Basic metabolic panel         CBC with Differential         Protime-INR      CT HEAD *** NON acute   MRI brain  ***no acute CVA  CXR - ***NON acute  CTabd/pelvis - ***nonacute  CTA chest - ***nonacute, no PE, * no evidence of infiltrate  Following Medications were ordered in ER: Medications  oxyCODONE -acetaminophen  (PERCOCET/ROXICET) 5-325 MG per tablet 1 tablet (1 tablet Oral Given 09/12/23 1948)    _______________________________________________________ ER Provider Called:       DrAaron Aas  They Recommend admit to medicine *** Will see in AM  ***SEEN in ER     ED Triage Vitals  Encounter Vitals Group     BP 09/12/23 1927 (!) 141/71     Systolic BP Percentile --  Diastolic BP Percentile --      Pulse Rate 09/12/23 1927 65     Resp 09/12/23 1927 20     Temp 09/12/23 1927 98.2 F (36.8 C)     Temp Source 09/12/23 1927 Oral     SpO2 09/12/23 1927 100 %     Weight 09/12/23 1928 125 lb (56.7 kg)     Height 09/12/23 1928 5\' 4"  (1.626 m)     Head Circumference --      Peak Flow --      Pain Score 09/12/23 1928 6     Pain Loc --      Pain Education --      Exclude from Growth Chart --   WUJW(11)@     _________________________________________ Significant initial  Findings: Abnormal Labs Reviewed  BASIC METABOLIC PANEL WITH GFR - Abnormal; Notable for the following components:      Result Value   Glucose, Bld 114 (*)    Calcium 10.4 (*)    All other components within normal limits  CBC WITH DIFFERENTIAL/PLATELET - Abnormal; Notable for the following components:   WBC 12.7 (*)    Hemoglobin 11.5 (*)    HCT 35.6 (*)    Neutro Abs 9.7 (*)    All  other components within normal limits      _________________________ Troponin ***ordered Cardiac Panel (last 3 results) No results for input(s): "CKTOTAL", "CKMB", "TROPONINIHS", "RELINDX" in the last 72 hours.   ECG: Ordered Personally reviewed and interpreted by me showing: HR : *** Rhythm: *NSR, Sinus tachycardia * A.fib. W RVR, RBBB, LBBB, Paced Ischemic changes*nonspecific changes, no evidence of ischemic changes QTC*  BNP (last 3 results) No results for input(s): "BNP" in the last 8760 hours.   COVID-19 Labs  No results for input(s): "DDIMER", "FERRITIN", "LDH", "CRP" in the last 72 hours.  No results found for: "SARSCOV2NAA"     ____________________ This patient meets SIRS Criteria and may be septic. SIRS = Systemic Inflammatory Response Syndrome  Order a lactic acid level if needed AND/OR Initiate the sepsis protocol with the attached order set OR Click "Treating Associated Infection or Illness" if the patient is being treated for an infection that is a known cause of these abnormalities     The recent clinical data is shown below. Vitals:   09/12/23 1928 09/12/23 2135 09/12/23 2200 09/12/23 2230  BP:  124/68 106/86 125/62  Pulse:  65 66 (!) 58  Resp:  16 16 18   Temp:      TempSrc:      SpO2:  100% 97% 97%  Weight: 56.7 kg     Height: 5\' 4"  (1.626 m)           WBC     Component Value Date/Time   WBC 12.7 (H) 09/12/2023 1940   LYMPHSABS 1.9 09/12/2023 1940   MONOABS 0.8 09/12/2023 1940   EOSABS 0.0 09/12/2023 1940   BASOSABS 0.1 09/12/2023 1940        Lactic Acid, Venous No results found for: "LATICACIDVEN"    Lactic Acid, Venous No results found for: "LATICACIDVEN"  Procalcitonin *** Ordered      UA *** no evidence of UTI  ***Pending ***not ordered   Urine analysis:    Component Value Date/Time   COLORURINE YELLOW 07/29/2019 0935   APPEARANCEUR CLEAR 07/29/2019 0935   LABSPEC 1.010 07/29/2019 0935   PHURINE 7.0 07/29/2019  0935   GLUCOSEU NEGATIVE 07/29/2019 0935   HGBUR NEGATIVE 07/29/2019 0935  HGBUR negative 06/16/2009 1003   BILIRUBINUR n 07/18/2017 1259   KETONESUR NEGATIVE 07/29/2019 0935   PROTEINUR NEGATIVE 07/29/2019 0935   UROBILINOGEN 0.2 07/18/2017 1259   UROBILINOGEN 0.2 06/16/2009 1003   NITRITE n 07/18/2017 1259   NITRITE negative 06/16/2009 1003   LEUKOCYTESUR Negative 07/18/2017 1259    Results for orders placed or performed during the hospital encounter of 10/23/22  Surgical pcr screen     Status: Abnormal   Collection Time: 10/24/22 10:39 AM   Specimen: Nasal Mucosa; Nasal Swab  Result Value Ref Range Status   MRSA, PCR NEGATIVE NEGATIVE Final   Staphylococcus aureus POSITIVE (A) NEGATIVE Final    Comment: (NOTE) The Xpert SA Assay (FDA approved for NASAL specimens in patients 56 years of age and older), is one component of a comprehensive surveillance program. It is not intended to diagnose infection nor to guide or monitor treatment. Performed at [facility], [address] [place], [place] [address]     ABX started Antibiotics Given (last 72 hours)     None       No results found for the last 90 days.     ________________________________________________________________  Arterial ***Venous  Blood Gas result:  pH *** pCO2 ***; pO2 ***;     %O2 Sat ***.  ABG No results found for: "PHART", "PCO2ART", "PO2ART", "HCO3", "TCO2", "ACIDBASEDEF", "O2SAT"     __________________________________________________________ Recent Labs  Lab 09/11/23 0857 09/12/23 1940  NA 140 139  K 4.2 3.6  CO2 31 26  GLUCOSE 87 114*  BUN 15 17  CREATININE 0.77 0.89  CALCIUM 10.0 10.4*    Cr  * stable,  Up from baseline see below Lab Results  Component Value Date   CREATININE 0.89 09/12/2023   CREATININE 0.77 09/11/2023   CREATININE 0.71 10/26/2022    Recent Labs  Lab 09/11/23 0857  AST 14  ALT 8  ALKPHOS 50  BILITOT 0.6  PROT 6.6  ALBUMIN 4.4   Lab  Results  Component Value Date   CALCIUM 10.4 (H) 09/12/2023   PHOS 2.2 (L) 10/26/2022          Plt: Lab Results  Component Value Date   PLT 185 09/12/2023         Recent Labs  Lab 09/11/23 0857 09/12/23 1940  WBC 6.1 12.7*  NEUTROABS  --  9.7*  HGB 12.9 11.5*  HCT 38.8 35.6*  MCV 90.6 91.5  PLT 202.0 185    HG/HCT * stable,  Down *Up from baseline see below    Component Value Date/Time   HGB 11.5 (L) 09/12/2023 1940   HCT 35.6 (L) 09/12/2023 1940   MCV 91.5 09/12/2023 1940      No results for input(s): "LIPASE", "AMYLASE" in the last 168 hours. No results for input(s): "AMMONIA" in the last 168 hours.    .lab  _______________________________________________ Hospitalist was called for admission for *** Closed fracture of right hip, initial encounter [facility]) ***    The following Work up has been ordered so far:  Orders Placed This Encounter  Procedures  . DG Hip Unilat With Pelvis 2-3 Views Right  . DG FEMUR, MIN 2 VIEWS RIGHT  . CT Hip Right Wo Contrast  . Basic metabolic panel  . CBC with Differential  . Protime-INR  . Diet NPO time specified  . Check CMS  . Bed rest  . Initiate Carrier Fluid Protocol  . Consult to orthopedic surgery  Dr. Lucienne Ryder  . Consult to  orthopedic surgery  Dr. Lucienne Ryder  . Consult to hospitalist  . Type and screen [facility]     OTHER Significant initial  Findings:  labs showing:     DM  labs:  HbA1C: Recent Labs    09/11/23 0857  HGBA1C 5.6       CBG (last 3)  No results for input(s): "GLUCAP" in the last 72 hours.        Cultures: No results found for: "SDES", "SPECREQUEST", "CULT", "REPTSTATUS"   Radiological Exams on Admission: CT Hip Right Wo Contrast Result Date: 09/12/2023 CLINICAL DATA:  Fall with hip pain EXAM: CT OF THE RIGHT HIP WITHOUT CONTRAST TECHNIQUE: Multidetector CT imaging of the right hip was performed according to the standard protocol. Multiplanar CT image  reconstructions were also generated. RADIATION DOSE REDUCTION: This exam was performed according to the departmental dose-optimization program which includes automated exposure control, adjustment of the mA and/or kV according to patient size and/or use of iterative reconstruction technique. COMPARISON:  09/12/2023, 10/23/2022 FINDINGS: Bones/Joint/Cartilage Previous right hip replacement with normal alignment. Acute mildly comminuted fracture involves the greater trochanter and subtrochanteric proximal shaft of the femur. There is mild fracture displacement. Right pubic ramus and acetabulum show no fracture. Right SI joint and image sacrum appear intact. Pubic symphysis is intact. No significant hip effusion Ligaments Suboptimally assessed by CT. Muscles and Tendons No significant atrophy. Probable edema within the proximal thigh muscles. No intramuscular focal fluid collection Soft tissues Edema within the soft tissues surrounding the right hip IMPRESSION: Previous right hip replacement with normal alignment. Acute mildly comminuted and displaced fracture periprosthetic involving the greater trochanter and subtrochanteric proximal shaft of the femur. Electronically Signed   By: Esmeralda Hedge M.D.   On: 09/12/2023 23:30   DG FEMUR, MIN 2 VIEWS RIGHT Result Date: 09/12/2023 CLINICAL DATA:  Status post fall.  Leg swelling. EXAM: RIGHT FEMUR 2 VIEWS COMPARISON:  Same day radiographs of the right hip 8:08 p.m. FINDINGS: Status post right hip arthroplasty. Acute minimally displaced fracture of the inferolateral aspect of the right greater trochanter. No dislocation. Mild tricompartmental degenerative changes of the knee. Soft tissue swelling of the right hip. IMPRESSION: 1. Acute minimally displaced fracture of the right greater trochanter. 2. Right hip arthroplasty in stable alignment. Electronically Signed   By: Mannie Seek M.D.   On: 09/12/2023 22:12   DG Hip Unilat With Pelvis 2-3 Views Right Result Date:  09/12/2023 CLINICAL DATA:  Status post fall. EXAM: DG HIP (WITH OR WITHOUT PELVIS) 2-3V RIGHT COMPARISON:  March 07, 2023 FINDINGS: There is a total right hip replacement. A small, minimally displaced acute fracture is seen along the inferolateral aspect of the greater trochanter of the proximal right femur. There is no evidence of dislocation. IMPRESSION: 1. Acute fracture of the greater trochanter of the proximal right femur. 2. Total right hip replacement. Electronically Signed   By: Virgle Grime M.D.   On: 09/12/2023 21:08   _______________________________________________________________________________________________________ Latest  Blood pressure 125/62, pulse (!) 58, temperature 98.2 F (36.8 C), temperature source Oral, resp. rate 18, height 5\' 4"  (1.626 m), weight 56.7 kg, SpO2 97%.   Vitals  labs and radiology finding personally reviewed  Review of Systems:    Pertinent positives include: ***  Constitutional:  No weight loss, night sweats, Fevers, chills, fatigue, weight loss  HEENT:  No headaches, Difficulty swallowing,Tooth/dental problems,Sore throat,  No sneezing, itching, ear ache, nasal congestion, post nasal drip,  Cardio-vascular:  No chest pain, Orthopnea,  PND, anasarca, dizziness, palpitations.no Bilateral lower extremity swelling  GI:  No heartburn, indigestion, abdominal pain, nausea, vomiting, diarrhea, change in bowel habits, loss of appetite, melena, blood in stool, hematemesis Resp:  no shortness of breath at rest. No dyspnea on exertion, No excess mucus, no productive cough, No non-productive cough, No coughing up of blood.No change in color of mucus.No wheezing. Skin:  no rash or lesions. No jaundice GU:  no dysuria, change in color of urine, no urgency or frequency. No straining to urinate.  No flank pain.  Musculoskeletal:  No joint pain or no joint swelling. No decreased range of motion. No back pain.  Psych:  No change in mood or affect. No  depression or anxiety. No memory loss.  Neuro: no localizing neurological complaints, no tingling, no weakness, no double vision, no gait abnormality, no slurred speech, no confusion  All systems reviewed and apart from HOPI all are negative _______________________________________________________________________________________________ Past Medical History:   Past Medical History:  Diagnosis Date  . Colon polyp   . Hypothyroid    hypothyroid  . Migraine   . Multiple dysplastic nevi    established with Dr. Marcia Setters dermatology  . Osteopenia       Past Surgical History:  Procedure Laterality Date  . ABDOMINAL HYSTERECTOMY  1972  . CHOLECYSTECTOMY  1995  . OVARIAN CYST REMOVAL Right    right  . TONSILLECTOMY AND ADENOIDECTOMY  1952  . TOTAL HIP ARTHROPLASTY Right 10/24/2022   Procedure: TOTAL HIP ARTHROPLASTY ANTERIOR APPROACH;  Surgeon: Arnie Lao, MD;  Location: [facility] OR;  Service: Orthopedics;  Laterality: Right;    Social History:  Ambulatory *** independently cane, walker  wheelchair bound, bed bound     reports that she has never smoked. She has never used smokeless tobacco. She reports that she does not drink alcohol and does not use drugs.     Family History: *** Family History  Problem Relation Age of Onset  . Brain cancer Brother        BRAIN TUMOR  . Cancer Maternal Uncle        ?   ______________________________________________________________________________________________ Allergies: No Known Allergies   Prior to Admission medications   Medication Sig Start Date End Date Taking? Authorizing Provider  aspirin  81 MG chewable tablet Chew 1 tablet (81 mg total) by mouth 2 (two) times daily. 10/27/22   Arnie Lao, MD  Coenzyme Q10 (CO Q 10) 100 MG CAPS Take 1 capsule by mouth every evening.    [provider]  conjugated estrogens  (PREMARIN ) vaginal cream 1 applicator of cream  intravaginally 1-2 weekly qhs 09/11/23   Kuneff,  Renee A, DO  cyanocobalamin  1000 MCG tablet Take 1 tablet (1,000 mcg total) by mouth daily. 10/27/22   Gonfa, Taye T, MD  fluticasone  (FLONASE ) 50 MCG/ACT nasal spray Place 2 sprays into both nostrils daily. Patient taking differently: Place 2 sprays into both nostrils daily as needed for allergies. 09/07/22   Kuneff, Renee A, DO  hydrochlorothiazide  (HYDRODIURIL ) 25 MG tablet Take 1 tablet (25 mg total) by mouth daily. 09/12/23   Kuneff, Renee A, DO  levothyroxine  (SYNTHROID ) 100 MCG tablet Take 1 tablet (100 mcg total) by mouth daily. 09/12/23   Kuneff, Renee A, DO  Multiple Vitamin (MULTIVITAMIN) tablet Take 1 tablet by mouth every evening.    [provider]  oxybutynin  (DITROPAN -XL) 10 MG 24 hr tablet Take 10 mg by mouth daily.    [provider]  SUMAtriptan  (IMITREX ) 5 MG/ACT  nasal spray Place 1 spray (5 mg total) into the nose every 2 (two) hours as needed. 09/11/23   Napolean Backbone A, DO    ___________________________________________________________________________________________________ Physical Exam:    09/12/2023   10:30 PM 09/12/2023   10:00 PM 09/12/2023    9:35 PM  Vitals with BMI  Systolic 125 106 161  Diastolic 62 86 68  Pulse 58 66 65     1. General:  in No ***Acute distress***increased work of breathing ***complaining of severe pain****agitated * Chronically ill *well *cachectic *toxic acutely ill -appearing 2. Psychological: Alert and *** Oriented 3. Head/ENT:   Moist *** Dry Mucous Membranes                          Head Non traumatic, neck supple                          Normal *** Poor Dentition 4. SKIN: normal *** decreased Skin turgor,  Skin clean Dry and intact no rash    5. Heart: Regular rate and rhythm no*** Murmur, no Rub or gallop 6. Lungs: ***Clear to auscultation bilaterally, no wheezes or crackles   7. Abdomen: Soft, ***non-tender, Non distended *** obese ***bowel sounds present 8. Lower extremities: no clubbing, cyanosis, no  ***edema 9. Neurologically Grossly intact, moving all 4 extremities equally *** strength 5 out of 5 in all 4 extremities cranial nerves II through XII intact 10. MSK: Normal range of motion    Chart has been reviewed  ______________________________________________________________________________________________  Assessment/Plan  ***  Admitted for *** Closed fracture of right hip, initial encounter (HCC) ***    Present on Admission: **None**     No problem-specific Assessment & Plan notes found for this encounter.    Other plan as per orders.  DVT prophylaxis:  SCD *** Lovenox       Code Status:    Code Status: Prior FULL CODE *** DNR/DNI ***comfort care as per patient ***family  I had personally discussed CODE STATUS with patient and family*  ACP *** none has been reviewed ***   Family Communication:   Family not at  Bedside  plan of care was discussed on the phone with *** Son, Daughter, Wife, Husband, Sister, Brother , father, mother  Diet  Diet Orders (From admission, onward)     Start     Ordered   09/12/23 1933  Diet NPO time specified  Diet effective now        09/12/23 1933            Disposition Plan:   *** likely will need placement for rehabilitation                          Back to current facility when stable                            To home once workup is complete and patient is stable  ***Following barriers for discharge:                             Chest pain *** Stroke *** work up is complete                            Electrolytes corrected  Anemia corrected h/H stable                             Pain controlled with PO medications                               Afebrile, white count improving able to transition to PO antibiotics                             Will need to be able to tolerate PO                            Will likely need home health, home O2, set up                           Will need  consultants to evaluate patient prior to discharge       Consult Orders  (From admission, onward)           Start     Ordered   09/12/23 2348  Consult to hospitalist  Once       Provider:  (Not yet assigned)  Question Answer Comment  Place call to: [facility]   Reason for Consult Admit      09/12/23 2347                              ***Would benefit from PT/OT eval prior to DC  Ordered                   Swallow eval - SLP ordered                   Diabetes care coordinator                   Transition of care consulted                   Nutrition    consulted                  Wound care  consulted                   Palliative care    consulted                   Behavioral health  consulted                    Consults called: ***     Admission status:  ED Disposition     ED Disposition  Admit   Condition  --   Comment  The patient appears reasonably stabilized for admission considering the current resources, flow, and capabilities available in the ED at this time, and I doubt any other [facility] requiring further screening and/or treatment in the ED prior to admission is  present.           Obs***  ***  inpatient     I Expect 2 midnight stay secondary to severity of patient's current illness need for inpatient interventions justified by the following: ***hemodynamic instability despite optimal treatment (tachycardia *hypotension * tachypnea *hypoxia, hypercapnia) * Severe lab/radiological/exam abnormalities including:  and extensive comorbidities including: *substance abuse  *Chronic pain *DM2  * CHF * CAD  * COPD/asthma *Morbid Obesity * CKD *dementia *liver disease *history of stroke with residual deficits *  malignancy, * sickle cell disease  History of amputation Chronic anticoagulation  That are currently affecting medical management.   I expect  patient to be hospitalized for 2 midnights requiring inpatient medical  care.  Patient is at high risk for adverse outcome (such as loss of life or disability) if not treated.  Indication for inpatient stay as follows:  Severe change from baseline regarding mental status Hemodynamic instability despite maximal medical therapy,  ongoing suicidal ideations,  severe pain requiring acute inpatient management,  inability to maintain oral hydration   persistent chest pain despite medical management Need for operative/procedural  intervention New or worsening hypoxia   Need for IV antibiotics, IV fluids, IV rate controling medications, IV antihypertensives, IV pain medications, IV anticoagulation, need for biPAP    Level of care   *** tele  For 12H 24H     medical floor       progressive     stepdown   tele indefinitely please discontinue once patient no longer qualifies COVID-19 Labs    No results found for: "SARSCOV2NAA"   Precautions: admitted as *** Covid Negative  ***asymptomatic screening protocol****PUI *** covid positive No active isolations ***If Covid PCR is negative  - please DC precautions - would need additional investigation given very high risk for false native test result    Critical***  Patient is critically ill due to  hemodynamic instability * respiratory failure *severe sepsis* ongoing chest pain*  They are at high risk for life/limb threatening clinical deterioration requiring frequent reassessment and modifications of care.  Services provided include examination of the patient, review of relevant ancillary tests, prescription of lifesaving therapies, review of medications and prophylactic therapy.  Total critical care time excluding separately billable procedures: 60*  Minutes.    Josephine Rudnick 09/12/2023, 11:58 PM ***  [facility]     after 2 AM please page floor coverage PA If 7AM-7PM, please contact the day team taking care of the patient using [email or web address]

## 2023-09-12 NOTE — ED Provider Notes (Signed)
 [place] EMERGENCY DEPARTMENT AT [facility] Provider Note   CSN: [id number] Arrival date & time: 09/12/23  1809     History  Chief Complaint  Patient presents with   Hip Pain    Teresa Patterson is a 83 y.o. female.  83 year old female with history of right hip replacement 1 year ago presenting to the emergency department today right hip pain.  The patient fell on her right hip earlier today.  She states that she has been unable to ambulate on it since then.  She denies any other injuries.  Did not hit her head or lose consciousness.  She is not on any blood thinners.  She denies any focal weakness, numbness, or tingling.   Hip Pain       Home Medications Prior to Admission medications   Medication Sig Start Date End Date Taking? Authorizing Provider  aspirin  81 MG chewable tablet Chew 1 tablet (81 mg total) by mouth 2 (two) times daily. 10/27/22   Arnie Lao, MD  Coenzyme Q10 (CO Q 10) 100 MG CAPS Take 1 capsule by mouth every evening.    [provider]  conjugated estrogens  (PREMARIN ) vaginal cream 1 applicator of cream  intravaginally 1-2 weekly qhs 09/11/23   Kuneff, Renee A, DO  cyanocobalamin  1000 MCG tablet Take 1 tablet (1,000 mcg total) by mouth daily. 10/27/22   Gonfa, Taye T, MD  fluticasone  (FLONASE ) 50 MCG/ACT nasal spray Place 2 sprays into both nostrils daily. Patient taking differently: Place 2 sprays into both nostrils daily as needed for allergies. 09/07/22   Kuneff, Renee A, DO  hydrochlorothiazide  (HYDRODIURIL ) 25 MG tablet Take 1 tablet (25 mg total) by mouth daily. 09/12/23   Kuneff, Renee A, DO  levothyroxine  (SYNTHROID ) 100 MCG tablet Take 1 tablet (100 mcg total) by mouth daily. 09/12/23   Kuneff, Renee A, DO  Multiple Vitamin (MULTIVITAMIN) tablet Take 1 tablet by mouth every evening.    [provider]  oxybutynin  (DITROPAN -XL) 10 MG 24 hr tablet Take 10 mg by mouth daily.    [provider]  SUMAtriptan   (IMITREX ) 5 MG/ACT nasal spray Place 1 spray (5 mg total) into the nose every 2 (two) hours as needed. 09/11/23   Kuneff, Renee A, DO      Allergies    Patient has no known allergies.    Review of Systems   Review of Systems  Physical Exam Updated Vital Signs BP 125/62   Pulse (!) 58   Temp 98.2 F (36.8 C) (Oral)   Resp 18   Ht 5\' 4"  (1.626 m)   Wt 56.7 kg   SpO2 97%   BMI 21.46 kg/m  Physical Exam Vitals and nursing note reviewed.   Gen: NAD Eyes: PERRL, EOMI HEENT: no oropharyngeal swelling Neck: trachea midline Resp: clear to auscultation bilaterally Card: RRR, no murmurs, rubs, or gallops Abd: nontender, nondistended Extremities: no calf tenderness, no edema MSK:  tender over the right proximal femur, no gross deformity Vascular: 2+ radial pulses bilaterally, 2+ DP pulses bilaterally Skin: no rashes Psyc: acting appropriately   ED Results / Procedures / Treatments   Labs (all labs ordered are listed, but only abnormal results are displayed) Labs Reviewed  BASIC METABOLIC PANEL WITH GFR - Abnormal; Notable for the following components:      Result Value   Glucose, Bld 114 (*)    Calcium 10.4 (*)    All other components within normal limits  CBC WITH DIFFERENTIAL/PLATELET - Abnormal;  Notable for the following components:   WBC 12.7 (*)    Hemoglobin 11.5 (*)    HCT 35.6 (*)    Neutro Abs 9.7 (*)    All other components within normal limits  PROTIME-INR  TYPE AND SCREEN    EKG None  Radiology CT Hip Right Wo Contrast Result Date: 09/12/2023 CLINICAL DATA:  Fall with hip pain EXAM: CT OF THE RIGHT HIP WITHOUT CONTRAST TECHNIQUE: Multidetector CT imaging of the right hip was performed according to the standard protocol. Multiplanar CT image reconstructions were also generated. RADIATION DOSE REDUCTION: This exam was performed according to the departmental dose-optimization program which includes automated exposure control, adjustment of the mA and/or kV  according to patient size and/or use of iterative reconstruction technique. COMPARISON:  09/12/2023, 10/23/2022 FINDINGS: Bones/Joint/Cartilage Previous right hip replacement with normal alignment. Acute mildly comminuted fracture involves the greater trochanter and subtrochanteric proximal shaft of the femur. There is mild fracture displacement. Right pubic ramus and acetabulum show no fracture. Right SI joint and image sacrum appear intact. Pubic symphysis is intact. No significant hip effusion Ligaments Suboptimally assessed by CT. Muscles and Tendons No significant atrophy. Probable edema within the proximal thigh muscles. No intramuscular focal fluid collection Soft tissues Edema within the soft tissues surrounding the right hip IMPRESSION: Previous right hip replacement with normal alignment. Acute mildly comminuted and displaced fracture periprosthetic involving the greater trochanter and subtrochanteric proximal shaft of the femur. Electronically Signed   By: Esmeralda Hedge M.D.   On: 09/12/2023 23:30   DG FEMUR, MIN 2 VIEWS RIGHT Result Date: 09/12/2023 CLINICAL DATA:  Status post fall.  Leg swelling. EXAM: RIGHT FEMUR 2 VIEWS COMPARISON:  Same day radiographs of the right hip 8:08 p.m. FINDINGS: Status post right hip arthroplasty. Acute minimally displaced fracture of the inferolateral aspect of the right greater trochanter. No dislocation. Mild tricompartmental degenerative changes of the knee. Soft tissue swelling of the right hip. IMPRESSION: 1. Acute minimally displaced fracture of the right greater trochanter. 2. Right hip arthroplasty in stable alignment. Electronically Signed   By: Mannie Seek M.D.   On: 09/12/2023 22:12   DG Hip Unilat With Pelvis 2-3 Views Right Result Date: 09/12/2023 CLINICAL DATA:  Status post fall. EXAM: DG HIP (WITH OR WITHOUT PELVIS) 2-3V RIGHT COMPARISON:  March 07, 2023 FINDINGS: There is a total right hip replacement. A small, minimally displaced acute  fracture is seen along the inferolateral aspect of the greater trochanter of the proximal right femur. There is no evidence of dislocation. IMPRESSION: 1. Acute fracture of the greater trochanter of the proximal right femur. 2. Total right hip replacement. Electronically Signed   By: Virgle Grime M.D.   On: 09/12/2023 21:08    Procedures Procedures    Medications Ordered in ED Medications  oxyCODONE -acetaminophen  (PERCOCET/ROXICET) 5-325 MG per tablet 1 tablet (1 tablet Oral Given 09/12/23 1948)    ED Course/ Medical Decision Making/ A&P                                 Medical Decision Making 83 year old female presents the emergency department today with right hip pain after a mechanical fall.  X-ray from triage does show a greater trochanter fracture.  I did call and discussed patient's case with Dr. Sulema Endo.  Recommend CT scan to evaluate for periprosthetic fracture.  This will determine whether or not the patient needs surgery or not.  CT scan is  ordered.  Call was placed back to orthopedics.  She will require admission for pain control and physical therapy evaluation.  Calls placed to hospitalist service for admission.  Calls placed back to Dr. Sulema Endo to discuss.  Call pending at the time of signout.  Amount and/or Complexity of Data Reviewed Radiology: ordered.  Risk Decision regarding hospitalization.           Final Clinical Impression(s) / ED Diagnoses Final diagnoses:  Closed fracture of right hip, initial encounter [facility])    Rx / DC Orders ED Discharge Orders     None         Carin Charleston, MD 09/12/23 2353

## 2023-09-12 NOTE — ED Provider Triage Note (Addendum)
 Emergency Medicine Provider Triage Evaluation Note  Teresa Patterson , a 83 y.o. female  was evaluated in triage.  Pt complains of fall. Was playing with her grandchild when she fell and landed on R hip.  Unable to ambulate afterward.  Moderate R hip pain, no headache, cp, sob or numbness.  Prior R hip fx s/p repair by Dr. Lucienne Ryder.   Review of Systems  Positive: As above Negative: As above  Physical Exam  BP (!) 141/71 (BP Location: Left Arm)   Pulse 65   Temp 98.2 F (36.8 C) (Oral)   Resp 20   Ht 5\' 4"  (1.626 m)   Wt 56.7 kg   SpO2 100%   BMI 21.46 kg/m  Gen:   Awake, no distress   Resp:  Normal effort  MSK:   TTP R hip with decreased ROM Other:    Medical Decision Making  Medically screening exam initiated at 7:33 PM.  Appropriate orders placed.  Teresa Patterson was informed that the remainder of the evaluation will be completed by another provider, this initial triage assessment does not replace that evaluation, and the importance of remaining in the ED until their evaluation is complete.        Teresa Fairy, PA-C 09/12/23 2134

## 2023-09-12 NOTE — ED Triage Notes (Signed)
 The pt fell out in the yard while playing with her grandson around 1600 painful when walking poss broken hi.  She frasctured that same hip last summer

## 2023-09-12 NOTE — H&P (Signed)
 Teresa Patterson:[id number] DOB: 01-31-1941 DOA: 09/12/2023     PCP: Mariel Shope, DO   Outpatient Specialists:    Orthopedics Dr. Lucienne Ryder GI  Dr.  Serafin Dames, MD    Patient arrived to ER on 09/12/23 at 1809 Referred by Attending Carin Charleston, MD   Patient coming from:    home Lives alone,       Chief Complaint:  Chief Complaint  Patient presents with   Hip Pain    HPI: Teresa Patterson is a 83 y.o. female with medical history significant of hypothyroidism right hip fracture status post replacement, migraine, osteoporosis    Presented with mechanical fall onto right hip  Presents today via for fall was out in the yard playing with her grandson around 4 PM and fell onto right side.  Unable to ambulate on that side since no other injuries.  Did not hit her head no LOC not on blood thinners She has a history of hip fracture on the same side about a year ago Patient with known history of osteoporosis and hypothyroidism   DEXA: 02/23/2022 > (-2.4-osteopenia). Rpt 2 yrs   She was playing basketball with her Grand-great son when she fell down She does not get any chest pain or SOB no DOE at baseline  She is able to walk a few miles with no symptoms able to walk up a flight of stairs easily  No hx of CAD  Denies significant ETOH intake   Does not smoke   No results found for: "SARSCOV2NAA"      Regarding pertinent Chronic problems:      HTN on hydrochlorothiazide      Hypothyroidism:   Lab Results  Component Value Date   TSH 4.85 09/11/2023   on synthroid     Chronic anemia - baseline hg Hemoglobin & Hematocrit  Recent Labs    10/27/22 0838 09/11/23 0857 09/12/23 1940  HGB 10.2* 12.9 11.5*   Iron/TIBC/Ferritin/ %Sat    Component Value Date/Time   IRON 8 (L) 10/26/2022 0048   TIBC 302 10/26/2022 0048   FERRITIN 77 10/26/2022 0048   IRONPCTSAT 3 (L) 10/26/2022 0048    While in ER:     Imaging showing greater trochanteric fracture ER  provider discussed case with Dr. Sulema Endo of orthopedics Recommended CT scan to evaluate for periprosthetic fractures    Lab Orders         Basic metabolic panel         CBC with Differential         Protime-INR     Pelvis -  Acute fracture of the greater trochanter of the proximal right femur. 2. Total right hip replacement.  Right hip: Acute minimally displaced fracture of the right greater trochanter.   CXR - non acute  Borderline cardiomegaly.   CT hip - Previous right hip replacement with normal alignment. Acute mildly comminuted and displaced fracture periprosthetic involving the greater trochanter and subtrochanteric proximal shaft of the femur.   Following Medications were ordered in ER: Medications  oxyCODONE -acetaminophen  (PERCOCET/ROXICET) 5-325 MG per tablet 1 tablet (1 tablet Oral Given 09/12/23 1948)    _______________________________________________________ ER Provider Called:     Orthopedics  Dr. Sulema Endo They Recommend admit to medicine  NPo PM Will see in AM       ED Triage Vitals  Encounter Vitals Group     BP 09/12/23 1927 (!) 141/71     Systolic BP Percentile --  Diastolic BP Percentile --      Pulse Rate 09/12/23 1927 65     Resp 09/12/23 1927 20     Temp 09/12/23 1927 98.2 F (36.8 C)     Temp Source 09/12/23 1927 Oral     SpO2 09/12/23 1927 100 %     Weight 09/12/23 1928 125 lb (56.7 kg)     Height 09/12/23 1928 5\' 4"  (1.626 m)     Head Circumference --      Peak Flow --      Pain Score 09/12/23 1928 6     Pain Loc --      Pain Education --      Exclude from Growth Chart --   GNFA(21)@     _________________________________________ Significant initial  Findings: Abnormal Labs Reviewed  BASIC METABOLIC PANEL WITH GFR - Abnormal; Notable for the following components:      Result Value   Glucose, Bld 114 (*)    Calcium 10.4 (*)    All other components within normal limits  CBC WITH DIFFERENTIAL/PLATELET - Abnormal; Notable for the  following components:   WBC 12.7 (*)    Hemoglobin 11.5 (*)    HCT 35.6 (*)    Neutro Abs 9.7 (*)    All other components within normal limits     ECG: Ordered   The recent clinical data is shown below. Vitals:   09/12/23 1928 09/12/23 2135 09/12/23 2200 09/12/23 2230  BP:  124/68 106/86 125/62  Pulse:  65 66 (!) 58  Resp:  16 16 18   Temp:      TempSrc:      SpO2:  100% 97% 97%  Weight: 56.7 kg     Height: 5\' 4"  (1.626 m)       WBC     Component Value Date/Time   WBC 12.7 (H) 09/12/2023 1940   LYMPHSABS 1.9 09/12/2023 1940   MONOABS 0.8 09/12/2023 1940   EOSABS 0.0 09/12/2023 1940   BASOSABS 0.1 09/12/2023 1940     Results for orders placed or performed during the hospital encounter of 10/23/22  Surgical pcr screen     Status: Abnormal   Collection Time: 10/24/22 10:39 AM   Specimen: Nasal Mucosa; Nasal Swab  Result Value Ref Range Status   MRSA, PCR NEGATIVE NEGATIVE Final   Staphylococcus aureus POSITIVE (A) NEGATIVE Final    Comment: (NOTE) The Xpert SA Assay (FDA approved for NASAL specimens in patients 4 years of age and older), is one component of a comprehensive surveillance program. It is not intended to diagnose infection nor to guide or monitor treatment. Performed at [facility], [address] [place], [place] [address]     __________________________________________________________ Recent Labs  Lab 09/11/23 0857 09/12/23 1940  NA 140 139  K 4.2 3.6  CO2 31 26  GLUCOSE 87 114*  BUN 15 17  CREATININE 0.77 0.89  CALCIUM 10.0 10.4*    Cr   stable,   Lab Results  Component Value Date   CREATININE 0.89 09/12/2023   CREATININE 0.77 09/11/2023   CREATININE 0.71 10/26/2022    Recent Labs  Lab 09/11/23 0857  AST 14  ALT 8  ALKPHOS 50  BILITOT 0.6  PROT 6.6  ALBUMIN 4.4   Lab Results  Component Value Date   CALCIUM 10.4 (H) 09/12/2023   PHOS 2.2 (L) 10/26/2022    Plt: Lab Results  Component Value Date   PLT 185  09/12/2023  Recent Labs  Lab 09/11/23 0857 09/12/23 1940  WBC 6.1 12.7*  NEUTROABS  --  9.7*  HGB 12.9 11.5*  HCT 38.8 35.6*  MCV 90.6 91.5  PLT 202.0 185    HG/HCT  stable,       Component Value Date/Time   HGB 11.5 (L) 09/12/2023 1940   HCT 35.6 (L) 09/12/2023 1940   MCV 91.5 09/12/2023 1940     _______________________________________________ Hospitalist was called for admission for   Closed fracture of right hip      The following Work up has been ordered so far:  Orders Placed This Encounter  Procedures   DG Hip Unilat With Pelvis 2-3 Views Right   DG FEMUR, MIN 2 VIEWS RIGHT   CT Hip Right Wo Contrast   Basic metabolic panel   CBC with Differential   Protime-INR   Diet NPO time specified   Check CMS   Bed rest   Initiate Carrier Fluid Protocol   Consult to orthopedic surgery  Dr. Lucienne Ryder   Consult to orthopedic surgery  Dr. Lucienne Ryder   Consult to hospitalist   Type and screen [facility]     OTHER Significant initial  Findings:  labs showing:     DM  labs:  HbA1C: Recent Labs    09/11/23 0857  HGBA1C 5.6       CBG (last 3)  No results for input(s): "GLUCAP" in the last 72 hours.        Cultures: No results found for: "SDES", "SPECREQUEST", "CULT", "REPTSTATUS"   Radiological Exams on Admission: CT Hip Right Wo Contrast Result Date: 09/12/2023 CLINICAL DATA:  Fall with hip pain EXAM: CT OF THE RIGHT HIP WITHOUT CONTRAST TECHNIQUE: Multidetector CT imaging of the right hip was performed according to the standard protocol. Multiplanar CT image reconstructions were also generated. RADIATION DOSE REDUCTION: This exam was performed according to the departmental dose-optimization program which includes automated exposure control, adjustment of the mA and/or kV according to patient size and/or use of iterative reconstruction technique. COMPARISON:  09/12/2023, 10/23/2022 FINDINGS: Bones/Joint/Cartilage Previous right hip  replacement with normal alignment. Acute mildly comminuted fracture involves the greater trochanter and subtrochanteric proximal shaft of the femur. There is mild fracture displacement. Right pubic ramus and acetabulum show no fracture. Right SI joint and image sacrum appear intact. Pubic symphysis is intact. No significant hip effusion Ligaments Suboptimally assessed by CT. Muscles and Tendons No significant atrophy. Probable edema within the proximal thigh muscles. No intramuscular focal fluid collection Soft tissues Edema within the soft tissues surrounding the right hip IMPRESSION: Previous right hip replacement with normal alignment. Acute mildly comminuted and displaced fracture periprosthetic involving the greater trochanter and subtrochanteric proximal shaft of the femur. Electronically Signed   By: Esmeralda Hedge M.D.   On: 09/12/2023 23:30   DG FEMUR, MIN 2 VIEWS RIGHT Result Date: 09/12/2023 CLINICAL DATA:  Status post fall.  Leg swelling. EXAM: RIGHT FEMUR 2 VIEWS COMPARISON:  Same day radiographs of the right hip 8:08 p.m. FINDINGS: Status post right hip arthroplasty. Acute minimally displaced fracture of the inferolateral aspect of the right greater trochanter. No dislocation. Mild tricompartmental degenerative changes of the knee. Soft tissue swelling of the right hip. IMPRESSION: 1. Acute minimally displaced fracture of the right greater trochanter. 2. Right hip arthroplasty in stable alignment. Electronically Signed   By: Mannie Seek M.D.   On: 09/12/2023 22:12   DG Hip Unilat With Pelvis 2-3 Views Right Result Date: 09/12/2023 CLINICAL DATA:  Status post fall. EXAM: DG HIP (WITH OR WITHOUT PELVIS) 2-3V RIGHT COMPARISON:  March 07, 2023 FINDINGS: There is a total right hip replacement. A small, minimally displaced acute fracture is seen along the inferolateral aspect of the greater trochanter of the proximal right femur. There is no evidence of dislocation. IMPRESSION: 1. Acute fracture  of the greater trochanter of the proximal right femur. 2. Total right hip replacement. Electronically Signed   By: Virgle Grime M.D.   On: 09/12/2023 21:08   _______________________________________________________________________________________________________ Latest  Blood pressure 125/62, pulse (!) 58, temperature 98.2 F (36.8 C), temperature source Oral, resp. rate 18, height 5\' 4"  (1.626 m), weight 56.7 kg, SpO2 97%.   Vitals  labs and radiology finding personally reviewed  Review of Systems:    Pertinent positives include: right  hip pain   Constitutional:  No weight loss, night sweats, Fevers, chills, fatigue, weight loss  HEENT:  No headaches, Difficulty swallowing,Tooth/dental problems,Sore throat,  No sneezing, itching, ear ache, nasal congestion, post nasal drip,  Cardio-vascular:  No chest pain, Orthopnea, PND, anasarca, dizziness, palpitations.no Bilateral lower extremity swelling  GI:  No heartburn, indigestion, abdominal pain, nausea, vomiting, diarrhea, change in bowel habits, loss of appetite, melena, blood in stool, hematemesis Resp:  no shortness of breath at rest. No dyspnea on exertion, No excess mucus, no productive cough, No non-productive cough, No coughing up of blood.No change in color of mucus.No wheezing. Skin:  no rash or lesions. No jaundice GU:  no dysuria, change in color of urine, no urgency or frequency. No straining to urinate.  No flank pain.  Musculoskeletal:  No joint pain or no joint swelling. No decreased range of motion. No back pain.  Psych:  No change in mood or affect. No depression or anxiety. No memory loss.  Neuro: no localizing neurological complaints, no tingling, no weakness, no double vision, no gait abnormality, no slurred speech, no confusion  All systems reviewed and apart from HOPI all are negative _______________________________________________________________________________________________ Past Medical History:    Past Medical History:  Diagnosis Date   Colon polyp    Hypothyroid    hypothyroid   Migraine    Multiple dysplastic nevi    established with Dr. Marcia Setters dermatology   Osteopenia      Past Surgical History:  Procedure Laterality Date   ABDOMINAL HYSTERECTOMY  1972   CHOLECYSTECTOMY  1995   OVARIAN CYST REMOVAL Right    right   TONSILLECTOMY AND ADENOIDECTOMY  1952   TOTAL HIP ARTHROPLASTY Right 10/24/2022   Procedure: TOTAL HIP ARTHROPLASTY ANTERIOR APPROACH;  Surgeon: Arnie Lao, MD;  Location: [facility] OR;  Service: Orthopedics;  Laterality: Right;    Social History:  Ambulatory   independently     reports that she has never smoked. She has never used smokeless tobacco. She reports that she does not drink alcohol and does not use drugs.    Family History:   Family History  Problem Relation Age of Onset   Brain cancer Brother        BRAIN TUMOR   Cancer Maternal Uncle        ?   ______________________________________________________________________________________________ Allergies: No Known Allergies   Prior to Admission medications   Medication Sig Start Date End Date Taking? Authorizing Provider  aspirin  81 MG chewable tablet Chew 1 tablet (81 mg total) by mouth 2 (two) times daily. 10/27/22   Arnie Lao, MD  Coenzyme Q10 (CO Q 10) 100 MG CAPS Take 1 capsule by  mouth every evening.    [provider]  conjugated estrogens  (PREMARIN ) vaginal cream 1 applicator of cream  intravaginally 1-2 weekly qhs 09/11/23   Kuneff, Renee A, DO  cyanocobalamin  1000 MCG tablet Take 1 tablet (1,000 mcg total) by mouth daily. 10/27/22   Gonfa, Taye T, MD  fluticasone  (FLONASE ) 50 MCG/ACT nasal spray Place 2 sprays into both nostrils daily. Patient taking differently: Place 2 sprays into both nostrils daily as needed for allergies. 09/07/22   Kuneff, Renee A, DO  hydrochlorothiazide  (HYDRODIURIL ) 25 MG tablet Take 1 tablet (25 mg total) by mouth daily.  09/12/23   Kuneff, Renee A, DO  levothyroxine  (SYNTHROID ) 100 MCG tablet Take 1 tablet (100 mcg total) by mouth daily. 09/12/23   Kuneff, Renee A, DO  Multiple Vitamin (MULTIVITAMIN) tablet Take 1 tablet by mouth every evening.    [provider]  oxybutynin  (DITROPAN -XL) 10 MG 24 hr tablet Take 10 mg by mouth daily.    [provider]  SUMAtriptan  (IMITREX ) 5 MG/ACT nasal spray Place 1 spray (5 mg total) into the nose every 2 (two) hours as needed. 09/11/23   Napolean Backbone A, DO    ___________________________________________________________________________________________________ Physical Exam:    09/12/2023   10:30 PM 09/12/2023   10:00 PM 09/12/2023    9:35 PM  Vitals with BMI  Systolic 125 106 161  Diastolic 62 86 68  Pulse 58 66 65     1. General:  in No  Acute distress   Well  -appearing 2. Psychological: Alert and   Oriented 3. Head/ENT:    Dry Mucous Membranes                          Head Non traumatic, neck supple                         Poor Dentition 4. SKIN: normal   Skin turgor,  Skin clean Dry and intact no rash    5. Heart: Regular rate and rhythm no  Murmur, no Rub or gallop 6. Lungs:  Clear to auscultation bilaterally, no wheezes or crackles   7. Abdomen: Soft,  non-tender, Non distended bowel sounds present 8. Lower extremities: no clubbing, cyanosis, no  edema 9. Neurologically Grossly intact, moving all 4 extremities equally    10. MSK: Normal range of motion limited in the right hip    Chart has been reviewed  ______________________________________________________________________________________________  Assessment/Plan 83 y.o. female with medical history significant of hypothyroidism right hip fracture status post replacement, migraine, osteoporosis  Admitted for  Closed fracture of right hip     Present on Admission:  Closed right hip fracture (HCC)  Closed right hip fracture, initial encounter (HCC)  Hypothyroidism  Anemia   Cardiomegaly    Closed right hip fracture, initial encounter (HCC) Right hip periprosthetic fracture.  Orthopedics evaluated.  Plan for consult with Dr. Lucienne Ryder in a.m. and then will discuss if needed operative intervention for now keep n.p.o. postmidnight and pain control  - management as per orthopedics,       Keep nothing by mouth post midnight. Patient  not on anticoagulation or antiplatelet agents    on hold Ordered type and screen,  order a vitamin D  level 45.09    Patient at baseline  able to walk a flight of stairs or 100 feet    Patient denies any chest pain or shortness of breath currently and/or with exertion,  ECG ordered  no known history of coronary artery disease, COPD   Liver failure or CKD  Given advanced age patient is at least moderate  risk  which has been discussed with family but at this point no furthther cardiac workup is indicated.      Hypothyroidism - Check TSH continue home medications Synthroid  at 100 mcg po q day   Anemia Obtain anemia panel  Transfuse for Hg <7 , rapidly dropping or  if symptomatic   Cardiomegaly On portable CXR Pt is asymptomatic Once stable can obtain 2 V CXR to further eval vs echogram    Other plan as per orders.  DVT prophylaxis:  SCD      Code Status:    Code Status: Prior FULL CODE  as per patient   I had personally discussed CODE STATUS with patient and family  ACP   none    Family Communication:   Family   at  Bedside  plan of care was discussed   with  Daughter   Diet  Diet Orders (From admission, onward)     Start     Ordered   09/12/23 1933  Diet NPO time specified  Diet effective now        09/12/23 1933            Disposition Plan:     likely will need placement for rehabilitation   Following barriers for discharge:                                                          Pain controlled with PO medications                                                         Will need consultants to  evaluate patient prior to discharge       Consult Orders  (From admission, onward)           Start     Ordered   09/12/23 2348  Consult to hospitalist  Once       Provider:  (Not yet assigned)  Question Answer Comment  Place call to: [facility]   Reason for Consult Admit      09/12/23 2347                              Transition of care consulted                     Consults called: Orthopedics is aware Dr. Lucienne Ryder will see in a.m.   Admission status:  ED Disposition     ED Disposition  Admit   Condition  --   Comment  Hospital Area: [facility] [[id number]]  Level of Care: Med-Surg [[id number]]  May admit patient to [facility] or [facility] if equivalent level of care is available:: No  Covid Evaluation: Asymptomatic - no recent exposure (last 10 days) testing not required  Diagnosis: Closed right hip fracture [facility]) [[id number]]  Admitting Physician: Mikayah Joy [[id number]]  Attending Physician: Adron Geisel [[id number]]  Certification:: I certify this patient will need inpatient services for at least 2 midnights  Expected Medical Readiness: 09/16/2023            inpatient     I Expect 2 midnight stay secondary to severity of patient's current illness need for inpatient interventions justified by the following:  hemodynamic instability despite optimal treatment    Severe lab/radiological/exam abnormalities including:       Closed fracture of right hip   That are currently affecting medical management.   I expect  patient to be hospitalized for 2 midnights requiring inpatient medical care.  Patient is at high risk for adverse outcome (such as loss of life or disability) if not treated.  Indication for inpatient stay as follows:    severe pain requiring acute inpatient management,  inability to maintain oral hydration   persistent chest pain despite medical management Need for operative/procedural  intervention     Need for  IV  fluids, IV pain medications     Level of care       medical floor        Selene Dais 09/13/2023, 12:42 AM    [facility]     after 2 AM please page floor coverage PA If 7AM-7PM, please contact the day team taking care of the patient using [email or web address]

## 2023-09-13 ENCOUNTER — Telehealth: Payer: Self-pay

## 2023-09-13 ENCOUNTER — Inpatient Hospital Stay (HOSPITAL_COMMUNITY): Admitting: Anesthesiology

## 2023-09-13 ENCOUNTER — Inpatient Hospital Stay (HOSPITAL_COMMUNITY)

## 2023-09-13 DIAGNOSIS — Y92017 Garden or yard in single-family (private) house as the place of occurrence of the external cause: Secondary | ICD-10-CM | POA: Diagnosis not present

## 2023-09-13 DIAGNOSIS — Z808 Family history of malignant neoplasm of other organs or systems: Secondary | ICD-10-CM | POA: Diagnosis not present

## 2023-09-13 DIAGNOSIS — E039 Hypothyroidism, unspecified: Secondary | ICD-10-CM | POA: Diagnosis not present

## 2023-09-13 DIAGNOSIS — I119 Hypertensive heart disease without heart failure: Secondary | ICD-10-CM | POA: Diagnosis not present

## 2023-09-13 DIAGNOSIS — Z9049 Acquired absence of other specified parts of digestive tract: Secondary | ICD-10-CM | POA: Diagnosis not present

## 2023-09-13 DIAGNOSIS — M810 Age-related osteoporosis without current pathological fracture: Secondary | ICD-10-CM | POA: Diagnosis not present

## 2023-09-13 DIAGNOSIS — I517 Cardiomegaly: Secondary | ICD-10-CM | POA: Insufficient documentation

## 2023-09-13 DIAGNOSIS — Z79899 Other long term (current) drug therapy: Secondary | ICD-10-CM | POA: Diagnosis not present

## 2023-09-13 DIAGNOSIS — M9701XA Periprosthetic fracture around internal prosthetic right hip joint, initial encounter: Secondary | ICD-10-CM | POA: Diagnosis not present

## 2023-09-13 DIAGNOSIS — D649 Anemia, unspecified: Secondary | ICD-10-CM | POA: Diagnosis not present

## 2023-09-13 DIAGNOSIS — S72001A Fracture of unspecified part of neck of right femur, initial encounter for closed fracture: Secondary | ICD-10-CM | POA: Diagnosis not present

## 2023-09-13 DIAGNOSIS — Z79890 Hormone replacement therapy (postmenopausal): Secondary | ICD-10-CM | POA: Diagnosis not present

## 2023-09-13 DIAGNOSIS — S72114A Nondisplaced fracture of greater trochanter of right femur, initial encounter for closed fracture: Secondary | ICD-10-CM

## 2023-09-13 DIAGNOSIS — S72144A Nondisplaced intertrochanteric fracture of right femur, initial encounter for closed fracture: Secondary | ICD-10-CM | POA: Diagnosis not present

## 2023-09-13 DIAGNOSIS — Z90710 Acquired absence of both cervix and uterus: Secondary | ICD-10-CM | POA: Diagnosis not present

## 2023-09-13 DIAGNOSIS — Z860100 Personal history of colon polyps, unspecified: Secondary | ICD-10-CM | POA: Diagnosis not present

## 2023-09-13 DIAGNOSIS — R2689 Other abnormalities of gait and mobility: Secondary | ICD-10-CM | POA: Diagnosis not present

## 2023-09-13 DIAGNOSIS — W19XXXA Unspecified fall, initial encounter: Secondary | ICD-10-CM | POA: Diagnosis not present

## 2023-09-13 DIAGNOSIS — Y998 Other external cause status: Secondary | ICD-10-CM | POA: Diagnosis not present

## 2023-09-13 DIAGNOSIS — Z8781 Personal history of (healed) traumatic fracture: Secondary | ICD-10-CM | POA: Diagnosis not present

## 2023-09-13 DIAGNOSIS — Y9389 Activity, other specified: Secondary | ICD-10-CM | POA: Diagnosis not present

## 2023-09-13 DIAGNOSIS — Z7982 Long term (current) use of aspirin: Secondary | ICD-10-CM | POA: Diagnosis not present

## 2023-09-13 HISTORY — DX: Nondisplaced fracture of greater trochanter of right femur, initial encounter for closed fracture: S72.114A

## 2023-09-13 LAB — BASIC METABOLIC PANEL WITH GFR
Anion gap: 10 (ref 5–15)
BUN: 16 mg/dL (ref 8–23)
CO2: 25 mmol/L (ref 22–32)
Calcium: 9.8 mg/dL (ref 8.9–10.3)
Chloride: 104 mmol/L (ref 98–111)
Creatinine, Ser: 0.84 mg/dL (ref 0.44–1.00)
GFR, Estimated: >60 mL/min (ref >60)
Glucose, Bld: 97 mg/dL (ref 70–99)
Potassium: 3.5 mmol/L (ref 3.5–5.1)
Sodium: 139 mmol/L (ref 135–145)

## 2023-09-13 LAB — IRON AND TIBC
Iron: 68 ug/dL (ref 28–170)
Saturation Ratios: 18 % (ref 10.4–31.8)
TIBC: 388 ug/dL (ref 250–450)
UIBC: 320 ug/dL

## 2023-09-13 LAB — RETICULOCYTES
Immature Retic Fract: 15.3 % (ref 2.3–15.9)
RBC.: 3.69 MIL/uL — ABNORMAL LOW (ref 3.87–5.11)
Retic Count, Absolute: 69.7 10*3/uL (ref 19.0–186.0)
Retic Ct Pct: 1.9 % (ref 0.4–3.1)

## 2023-09-13 LAB — MAGNESIUM: Magnesium: 2.1 mg/dL (ref 1.7–2.4)

## 2023-09-13 LAB — FOLATE: Folate: 35.4 ng/mL (ref >5.9)

## 2023-09-13 LAB — TSH: TSH: 5.316 u[IU]/mL — ABNORMAL HIGH (ref 0.350–4.500)

## 2023-09-13 LAB — CBC
HCT: 33.4 % — ABNORMAL LOW (ref 36.0–46.0)
Hemoglobin: 11 g/dL — ABNORMAL LOW (ref 12.0–15.0)
MCH: 29.9 pg (ref 26.0–34.0)
MCHC: 32.9 g/dL (ref 30.0–36.0)
MCV: 90.8 fL (ref 80.0–100.0)
Platelets: 152 10*3/uL (ref 150–400)
RBC: 3.68 MIL/uL — ABNORMAL LOW (ref 3.87–5.11)
RDW: 14.2 % (ref 11.5–15.5)
WBC: 9.6 10*3/uL (ref 4.0–10.5)
nRBC: 0 % (ref 0.0–0.2)

## 2023-09-13 LAB — PHOSPHORUS: Phosphorus: 3.3 mg/dL (ref 2.5–4.6)

## 2023-09-13 LAB — FERRITIN: Ferritin: 44 ng/mL (ref 11–307)

## 2023-09-13 LAB — CK: Total CK: 158 U/L (ref 38–234)

## 2023-09-13 LAB — VITAMIN B12: Vitamin B-12: 968 pg/mL — ABNORMAL HIGH (ref 180–914)

## 2023-09-13 MED ORDER — LACTATED RINGERS IV SOLN: INTRAVENOUS | Status: DC

## 2023-09-13 MED ORDER — LEVOTHYROXINE SODIUM 50 MCG PO TABS
100.0000 ug | ORAL_TABLET | Freq: Every day | ORAL | Status: DC
  Administered 2023-09-14 – 2023-09-15 (×2): 100 ug via ORAL
  Filled 2023-09-13 (×3): qty 2

## 2023-09-13 MED ORDER — FENTANYL CITRATE PF 50 MCG/ML IJ SOSY: 50.0000 ug | PREFILLED_SYRINGE | Freq: Once | INTRAMUSCULAR | Status: AC

## 2023-09-13 MED ORDER — ADULT MULTIVITAMIN W/MINERALS CH
1.0000 | ORAL_TABLET | Freq: Every day | ORAL | Status: DC
  Administered 2023-09-13 – 2023-09-15 (×3): 1 via ORAL
  Filled 2023-09-13 (×3): qty 1

## 2023-09-13 MED ORDER — ENSURE ENLIVE PO LIQD
237.0000 mL | ORAL | Status: DC
  Administered 2023-09-13 – 2023-09-15 (×3): 237 mL via ORAL

## 2023-09-13 MED ORDER — CLONIDINE HCL (ANALGESIA) 100 MCG/ML EP SOLN
EPIDURAL | Status: DC | PRN
  Administered 2023-09-13: 100 ug

## 2023-09-13 MED ORDER — METHOCARBAMOL 1000 MG/10ML IJ SOLN: 500.0000 mg | Freq: Four times a day (QID) | INTRAMUSCULAR | Status: DC | PRN

## 2023-09-13 MED ORDER — ROPIVACAINE HCL 5 MG/ML IJ SOLN
INTRAMUSCULAR | Status: DC | PRN
  Administered 2023-09-13: 30 mL via PERINEURAL

## 2023-09-13 MED ORDER — METHOCARBAMOL 500 MG PO TABS
500.0000 mg | ORAL_TABLET | Freq: Four times a day (QID) | ORAL | Status: DC | PRN
  Administered 2023-09-13 – 2023-09-15 (×4): 500 mg via ORAL
  Filled 2023-09-13 (×4): qty 1

## 2023-09-13 MED ORDER — FENTANYL CITRATE (PF) 100 MCG/2ML IJ SOLN
INTRAMUSCULAR | Status: AC
  Administered 2023-09-13: 50 ug
  Filled 2023-09-13: qty 2

## 2023-09-13 MED ORDER — ACETAMINOPHEN 500 MG PO TABS
1000.0000 mg | ORAL_TABLET | Freq: Four times a day (QID) | ORAL | Status: DC | PRN
  Administered 2023-09-13 – 2023-09-14 (×2): 1000 mg via ORAL
  Filled 2023-09-13 (×2): qty 2

## 2023-09-13 MED ORDER — HYDROMORPHONE HCL 1 MG/ML IJ SOLN
0.5000 mg | INTRAMUSCULAR | Status: DC | PRN
  Filled 2023-09-13: qty 1

## 2023-09-13 MED ORDER — OXYBUTYNIN CHLORIDE ER 10 MG PO TB24
10.0000 mg | ORAL_TABLET | Freq: Every day | ORAL | Status: DC
  Administered 2023-09-14 – 2023-09-15 (×2): 10 mg via ORAL
  Filled 2023-09-13 (×2): qty 1

## 2023-09-13 NOTE — Anesthesia Procedure Notes (Signed)
 Anesthesia Regional Block: Femoral nerve block   Pre-Anesthetic Checklist: , timeout performed,  Correct Patient, Correct Site, Correct Laterality,  Correct Procedure, Correct Position, site marked,  Risks and benefits discussed,  Surgical consent,  Pre-op evaluation,  At surgeon's request and post-op pain management  Laterality: Right  Prep: Maximum Sterile Barrier Precautions used, chloraprep       Needles:  Injection technique: Single-shot  Needle Type: Echogenic Needle      Needle Gauge: 20     Additional Needles:   Procedures:,,,, ultrasound used (permanent image in chart),,    Narrative:  Start time: 09/13/2023 9:40 AM End time: 09/13/2023 9:45 AM Injection made incrementally with aspirations every 5 mL.  Performed by: Personally  Anesthesiologist: [name], MD

## 2023-09-13 NOTE — Plan of Care (Signed)

## 2023-09-13 NOTE — TOC Initial Note (Signed)
 Transition of Care [facility]) - Initial/Assessment Note    Patient Details  Name: Teresa Patterson MRN: [id number] Date of Birth: [birth year]  Transition of Care [facility]) CM/SW Contact:    Kooper Godshall E Teesha Ohm, LCSW Phone Number: 09/13/2023, 2:21 PM  Clinical Narrative:                 Met with patient at bedside. Patient is from home alone. Patient states she has 3 daughters who are involved and supportive, 1 lives next door. PCP is Dr. Marylee Snowball. Pharmacy is [facility] [address]. Patient has a rolling walker, cane, shower chair, and bedside commode at home. Patient states she had home health in the past, unsure of agency. TOC will continue to follow.    Barriers to Discharge: Continued Medical Work up   Patient Goals and CMS Choice            Expected Discharge Plan and Services       Living arrangements for the past 2 months: Single Family Home                                      Prior Living Arrangements/Services Living arrangements for the past 2 months: Single Family Home Lives with:: Self Patient language and need for interpreter reviewed:: Yes Do you feel safe going back to the place where you live?: Yes      Need for Family Participation in Patient Care: Yes (Comment) Care giver support system in place?: Yes (comment) Current home services: DME Criminal Activity/Legal Involvement Pertinent to Current Situation/Hospitalization: No - Comment as needed  Activities of Daily Living   ADL Screening (condition at time of admission) Independently performs ADLs?: Yes (appropriate for developmental age) Is the patient deaf or have difficulty hearing?: No Does the patient have difficulty seeing, even when wearing glasses/contacts?: No Does the patient have difficulty concentrating, remembering, or making decisions?: No  Permission Sought/Granted Permission sought to share information with : Facility Medical sales representative, Family Supports Permission granted to share  information with : Yes, Verbal Permission Granted     Permission granted to share info w AGENCY: as needed for DC planning  Permission granted to share info w Relationship: daughters     Emotional Assessment       Orientation: : Oriented to Self, Oriented to Place, Oriented to  Time, Oriented to Situation Alcohol / Substance Use: Not Applicable Psych Involvement: No (comment)  Admission diagnosis:  Closed right hip fracture (HCC) [S72.001A] Closed fracture of right hip, initial encounter (HCC) [S72.001A] Patient Active Problem List   Diagnosis Date Noted   Anemia 09/13/2023   Cardiomegaly 09/13/2023   Nondisplaced fracture of greater trochanter of right femur, initial encounter for closed fracture (HCC) 09/13/2023   Closed intertrochanteric fracture of right femur (HCC) 10/23/2022   Migraine, chronic, without aura 09/05/2021   Osteopenia 11/06/2019   Edema 07/25/2018   Seasonal allergies 07/25/2018   Hypothyroidism 07/11/2016   Prominent aorta 07/11/2016   Primary osteoarthritis of first carpometacarpal joint of right hand 11/25/2015   Menopausal symptom 06/04/2007   PCP:  Mariel Shope, DO Pharmacy:   [facility] [address] [place] - [address] [place] [address] Phone: [phone] Fax: [phone]     Social Drivers of Health (SDOH) Social History: SDOH Screenings   Food Insecurity: No Food Insecurity (09/13/2023)  Housing: Low Risk  (09/13/2023)  Transportation Needs: No Transportation  Needs (09/13/2023)  Utilities: Not At Risk (09/13/2023)  Alcohol Screen: Low Risk  (01/25/2022)  Depression (PHQ2-9): Low Risk  (11/29/2022)  Financial Resource Strain: Low Risk  (11/29/2022)  Physical Activity: Sufficiently Active (11/29/2022)  Social Connections: Moderately Isolated (09/13/2023)  Stress: No Stress Concern Present (11/29/2022)  Tobacco Use: Low Risk  (09/11/2023)   SDOH Interventions:     Readmission Risk  Interventions    09/13/2023    2:19 PM  Readmission Risk Prevention Plan  Post Dischage Appt Complete  Medication Screening Complete  Transportation Screening Complete

## 2023-09-13 NOTE — Consult Note (Signed)
 Consultation Note  Date of consultation: 09/13/2023 Reason for consultation: Right hip periprosthetic greater trochanteric fracture Consulting service: [facility]  HPI: The patient is an active 83 year old female who we replaced her right hip in June of last year secondary to a hip fracture from a mechanical fall when she was power washing.  She did well with a right total hip arthroplasty and has been very mobile.  Unfortunately she was playing some basketball with her grandson yesterday and fell directly on her right hip.  She had significant right hip pain and pain with weightbearing.  She was brought to the [facility] emergency room and imaging studies including plain films and CT scan show a nondisplaced greater trochanteric fracture.  Orthopedic surgery is consulted for evaluation and treatment of this injury.  I came to the bedside to see the patient and her daughters at the bedside as well.  The patient reports right hip pain.  Exam: The patient's leg lengths are entirely equal.  There is no open wounds around the lateral aspect of her hip and her incisions well-healed from her direct anterior hip replacement surgery.  There is no significant bruising but there is pain to palpation of the greater trochanteric area of the right hip and pain with significant motion of the right hip over the trochanteric area.  Imaging studies: Plain films and CT scan of the patient's right hip are reviewed.  There is a greater trochanteric fracture with some slight comminution but it is anatomically aligned with no displacement.  Fortunately the hip replacement components are intact and the femoral component is bone ingrown and there is no subsidence of the femoral component.  Assessment: Right hip periprosthetic nondisplaced greater trochanteric fracture  Plan/recommendations: Unfortunately this type of injury can be treated nonoperative.  I talked to the patient and her daughter in length in  detail about this type of injury.  Her hip components are entirely intact and the fracture is completely nondisplaced.  I described this as being similar to rib fractures or pubic rami fractures and the fact that this area has a high blood supply and the bone should heal without any difficulty.  Surgery would be recommended only if the fracture was significant displaced or there was some subsidence of the components.  Fortunately that is not the case.  I did explain the patient will still have significant pain in that surgery would not improve her pain.  Once going to help most is the bone healing with time.  She is not a diabetic and not a smoker and not obese so that will help with bone healing.  I will put in orders for PT and OT further working with the patient on her mobilities and ADLs.  She should avoid any right hip abduction and any hip flexion past 90 degrees.  She should only touchdown weightbearing to up to 50% for balance only.  I will continue to follow her while she is in the hospital and I did reiterate that this type of injury is usually treated nonoperative but will still be painful due to the fact that that is broken bone.  I do expect her to do well with time.  I will eventually see her in the office for repeat x-rays as well over the next week to 2 weeks.

## 2023-09-13 NOTE — Hospital Course (Signed)
 HPI: Teresa Patterson is a 83 y.o. female with medical history significant of hypothyroidism right hip fracture status post replacement, migraine, osteoporosis. Presented with mechanical fall onto right hip  Presents today via for fall was out in the yard playing with her grandson around 4 PM and fell onto right side.  Unable to ambulate on that side since no other injuries.  Did not hit her head no LOC not on blood thinners. She has a history of hip fracture on the same side about a year ago. Patient with known history of osteoporosis and hypothyroidism.   DEXA: 02/23/2022 > (-2.4-osteopenia). Rpt 2 yrs.  She was playing basketball with her Grand-great son when she fell down. She does not get any chest pain or SOB no DOE at baseline.  She is able to walk a few miles with no symptoms able to walk up a flight of stairs easily. No hx of CAD  Significant Events: Admitted 09/12/2023 right hip fracture   Significant Labs: WBC 12.7, HgB 11.5, plt 185 Na 139, K 3.6, CO2 of 26, BUN 17, Scr 0.89, glu 114 TSH 5.3 CK 158  Significant Imaging Studies: Pelvic XR Acute fracture of the greater trochanter of the proximal right femur. 2. Total right hip replacement Right femur XR Acute minimally displaced fracture of the right greater trochanter. 2. Right hip arthroplasty in stable alignment. CT right hip Previous right hip replacement with normal alignment. Acute mildly comminuted and displaced fracture periprosthetic involving the greater trochanter and subtrochanteric proximal shaft of the femur.  Antibiotic Therapy: Anti-infectives (From admission, onward)    None       Procedures:   Consultants: ortho

## 2023-09-13 NOTE — Anesthesia Preprocedure Evaluation (Signed)
 Anesthesia Evaluation  Patient identified by MRN, date of birth, ID band Patient awake    Reviewed: Allergy & Precautions, NPO status , Patient's Chart, lab work & pertinent test results, reviewed documented beta blocker date and time   History of Anesthesia Complications Negative for: history of anesthetic complications  Airway Mallampati: II  TM Distance: >3 FB     Dental no notable dental hx.    Pulmonary neg COPD   breath sounds clear to auscultation       Cardiovascular (-) angina (-) CAD and (-) Past MI  Rhythm:Regular Rate:Normal     Neuro/Psych  Headaches    GI/Hepatic ,,,(+) neg Cirrhosis        Endo/Other  Hypothyroidism    Renal/GU Renal disease     Musculoskeletal  (+) Arthritis , Osteoarthritis,    Abdominal   Peds  Hematology  (+) Blood dyscrasia, anemia   Anesthesia Other Findings   Reproductive/Obstetrics                              Anesthesia Physical Anesthesia Plan  ASA: 2  Anesthesia Plan: Regional   Post-op Pain Management: Regional block*   Induction:   PONV Risk Score and Plan:   Airway Management Planned:   Additional Equipment:   Intra-op Plan:   Post-operative Plan:   Informed Consent: I have reviewed the patients History and Physical, chart, labs and discussed the procedure including the risks, benefits and alternatives for the proposed anesthesia with the patient or authorized representative who has indicated his/her understanding and acceptance.       Plan Discussed with:   Anesthesia Plan Comments:          Anesthesia Quick Evaluation

## 2023-09-13 NOTE — Progress Notes (Signed)
 PROGRESS NOTE    Teresa Patterson  NWG:[id number] DOB: [birth year] DOA: 09/12/2023 PCP: Teresa Backbone A, DO  Subjective: Pt seen and examined. Met with pt and her dtr Teresa Patterson. Pt fell when she was playing basketball with her great-grandson(83 years old) yesterday. No head trauma. Only takes ASA 81 mg daily. Has been NPO since MN.  Have sent message to orthopedics regarding patient and need for surgery today.   Hospital Course: HPI: Teresa Patterson is Patterson 83 y.o. female with medical history significant of hypothyroidism right hip fracture status post replacement, migraine, osteoporosis. Presented with mechanical fall onto right hip  Presents today via for fall was out in the yard playing with her grandson around 4 PM and fell onto right side.  Unable to ambulate on that side since no other injuries.  Did not hit her head no LOC not on blood thinners. She has Patterson history of hip fracture on the same side about Patterson year ago. Patient with known history of osteoporosis and hypothyroidism.   DEXA: 02/23/2022 > (-2.4-osteopenia). Rpt 2 yrs.  She was playing basketball with her Grand-great son when she fell down. She does not get any chest pain or SOB no DOE at baseline.  She is able to walk Patterson few miles with no symptoms able to walk up Patterson flight of stairs easily. No hx of CAD  Significant Events: Admitted 09/12/2023 right hip fracture   Significant Labs: WBC 12.7, HgB 11.5, plt 185 Na 139, K 3.6, CO2 of 26, BUN 17, Scr 0.89, glu 114 TSH 5.3 CK 158  Significant Imaging Studies: Pelvic XR Acute fracture of the greater trochanter of the proximal right femur. 2. Total right hip replacement Right femur XR Acute minimally displaced fracture of the right greater trochanter. 2. Right hip arthroplasty in stable alignment. CT right hip Previous right hip replacement with normal alignment. Acute mildly comminuted and displaced fracture periprosthetic involving the greater trochanter and subtrochanteric proximal shaft of  the femur.  Antibiotic Therapy: Anti-infectives (From admission, onward)    None       Procedures:   Consultants: ortho    Assessment and Plan: * Closed intertrochanteric fracture of right femur (HCC) 09-13-2023 has been NPO. I have called Dr. Arvella Bird office to notify him of pt's admission and need for possible ORIF.  Anemia 09-13-2023 stable.  Hypothyroidism 09-13-2023 stable. On synthroid .  DVT prophylaxis: SCDs Start: 09/13/23 0130    Code Status: Full Code Family Communication: discussed with pt and dtr Teresa Patterson at bedside Disposition Plan: unknown Reason for continuing need for hospitalization: awaiting ORIF.  Objective: Vitals:   09/13/23 0400 09/13/23 0500 09/13/23 0629 09/13/23 0739  BP: 113/75 122/61 116/68 122/66  Pulse: (!) 58 (!) 54 (!) 53 66  Resp: 18 18 18 18   Temp:   98.1 F (36.7 C) 98 F (36.7 C)  TempSrc:   Oral   SpO2: 97% 96% 99% 99%  Weight:      Height:       No intake or output data in the 24 hours ending 09/13/23 0849 Filed Weights   09/12/23 1928  Weight: 56.7 kg    Examination:  Physical Exam Vitals and nursing note reviewed.  Constitutional:      General: She is not in acute distress.    Appearance: She is normal weight. She is not toxic-appearing or diaphoretic.  HENT:     Head: Normocephalic and atraumatic.     Nose: Nose normal.  Cardiovascular:     Rate  and Rhythm: Normal rate and regular rhythm.  Pulmonary:     Effort: Pulmonary effort is normal.     Breath sounds: Normal breath sounds.  Abdominal:     General: Abdomen is flat. Bowel sounds are normal. There is no distension.     Palpations: Abdomen is soft.  Skin:    General: Skin is warm and dry.     Capillary Refill: Capillary refill takes less than 2 seconds.  Neurological:     General: No focal deficit present.     Mental Status: She is alert and oriented to person, place, and time.     Data Reviewed: I have personally reviewed following labs and  imaging studies  CBC: Recent Labs  Lab 09/11/23 0857 09/12/23 1940 09/13/23 0126  WBC 6.1 12.7* 9.6  NEUTROABS  --  9.7*  --   HGB 12.9 11.5* 11.0*  HCT 38.8 35.6* 33.4*  MCV 90.6 91.5 90.8  PLT 202.0 185 152   Basic Metabolic Panel: Recent Labs  Lab 09/11/23 0857 09/12/23 1940 09/13/23 0126  NA 140 139 139  K 4.2 3.6 3.5  CL 102 104 104  CO2 31 26 25   GLUCOSE 87 114* 97  BUN 15 17 16   CREATININE 0.77 0.89 0.84  CALCIUM 10.0 10.4* 9.8  MG  --   --  2.1  PHOS  --   --  3.3   GFR: Estimated Creatinine Clearance: 43.8 mL/min (by C-G formula based on SCr of 0.84 mg/dL). Liver Function Tests: Recent Labs  Lab 09/11/23 0857  AST 14  ALT 8  ALKPHOS 50  BILITOT 0.6  PROT 6.6  ALBUMIN 4.4   Coagulation Profile: Recent Labs  Lab 09/12/23 1940  INR 1.1   Cardiac Enzymes: Recent Labs  Lab 09/13/23 0126  CKTOTAL 158   HbA1C: Recent Labs    09/11/23 0857  HGBA1C 5.6   Lipid Profile: Recent Labs    09/11/23 0857  CHOL 161  HDL 70.30  LDLCALC 74  TRIG 83.0  CHOLHDL 2   Thyroid  Function Tests: Recent Labs    09/13/23 0126  TSH 5.316*   Anemia Panel: Recent Labs    09/13/23 0126  VITAMINB12 968*  FOLATE 35.4  FERRITIN 44  TIBC 388  IRON 68  RETICCTPCT 1.9   Radiology Studies: DG Chest Port 1 View Result Date: 09/13/2023 CLINICAL DATA:  Preop right hip fracture EXAM: PORTABLE CHEST 1 VIEW COMPARISON:  10/23/2022 FINDINGS: Borderline cardiomegaly. No acute airspace disease, pleural effusion or pneumothorax IMPRESSION: No active disease. Borderline cardiomegaly. Electronically Signed   By: Esmeralda Hedge M.D.   On: 09/13/2023 00:29   CT Hip Right Wo Contrast Result Date: 09/12/2023 CLINICAL DATA:  Fall with hip pain EXAM: CT OF THE RIGHT HIP WITHOUT CONTRAST TECHNIQUE: Multidetector CT imaging of the right hip was performed according to the standard protocol. Multiplanar CT image reconstructions were also generated. RADIATION DOSE REDUCTION:  This exam was performed according to the departmental dose-optimization program which includes automated exposure control, adjustment of the mA and/or kV according to patient size and/or use of iterative reconstruction technique. COMPARISON:  09/12/2023, 10/23/2022 FINDINGS: Bones/Joint/Cartilage Previous right hip replacement with normal alignment. Acute mildly comminuted fracture involves the greater trochanter and subtrochanteric proximal shaft of the femur. There is mild fracture displacement. Right pubic ramus and acetabulum show no fracture. Right SI joint and image sacrum appear intact. Pubic symphysis is intact. No significant hip effusion Ligaments Suboptimally assessed by CT. Muscles and Tendons No significant atrophy.  Probable edema within the proximal thigh muscles. No intramuscular focal fluid collection Soft tissues Edema within the soft tissues surrounding the right hip IMPRESSION: Previous right hip replacement with normal alignment. Acute mildly comminuted and displaced fracture periprosthetic involving the greater trochanter and subtrochanteric proximal shaft of the femur. Electronically Signed   By: Esmeralda Hedge M.D.   On: 09/12/2023 23:30   DG FEMUR, MIN 2 VIEWS RIGHT Result Date: 09/12/2023 CLINICAL DATA:  Status post fall.  Leg swelling. EXAM: RIGHT FEMUR 2 VIEWS COMPARISON:  Same day radiographs of the right hip 8:08 p.m. FINDINGS: Status post right hip arthroplasty. Acute minimally displaced fracture of the inferolateral aspect of the right greater trochanter. No dislocation. Mild tricompartmental degenerative changes of the knee. Soft tissue swelling of the right hip. IMPRESSION: 1. Acute minimally displaced fracture of the right greater trochanter. 2. Right hip arthroplasty in stable alignment. Electronically Signed   By: Mannie Seek M.D.   On: 09/12/2023 22:12   DG Hip Unilat With Pelvis 2-3 Views Right Result Date: 09/12/2023 CLINICAL DATA:  Status post fall. EXAM: DG HIP  (WITH OR WITHOUT PELVIS) 2-3V RIGHT COMPARISON:  March 07, 2023 FINDINGS: There is Patterson total right hip replacement. Patterson small, minimally displaced acute fracture is seen along the inferolateral aspect of the greater trochanter of the proximal right femur. There is no evidence of dislocation. IMPRESSION: 1. Acute fracture of the greater trochanter of the proximal right femur. 2. Total right hip replacement. Electronically Signed   By: Virgle Grime M.D.   On: 09/12/2023 21:08    Scheduled Meds:  [START ON 09/14/2023] levothyroxine   100 mcg Oral Daily   [START ON 09/14/2023] oxybutynin   10 mg Oral Daily   Continuous Infusions:  sodium chloride  100 mL/hr at 09/13/23 0125   lactated ringers        LOS: 0 days   Time spent: 45 minutes  Unk Garb, DO  [facility]  09/13/2023, 8:49 AM

## 2023-09-13 NOTE — Assessment & Plan Note (Signed)
-   Check TSH continue home medications Synthroid at 100 mcg po q day  

## 2023-09-13 NOTE — Assessment & Plan Note (Signed)
 Obtain anemia panel  Transfuse for Hg <7 , rapidly dropping or  if symptomatic

## 2023-09-13 NOTE — Telephone Encounter (Signed)
 Dr. Unk Garb from [facility] left Voicemail on Triage line. States Patient was admitted to hospital. Dr. Sulema Endo was on call. States she has a Periprosthetic intertrochanteric Fx right femur. Dr. Sulema Endo said he would let Dr. Lucienne Ryder know. There is no note in chart. Currently in [facility] Bed 6. Please call Dr. Arden Kotyk. Thank you.    CB [phone]

## 2023-09-13 NOTE — Subjective & Objective (Addendum)
 Pt seen and examined. Pain controlled. Pt not discharged yesterday after PT provider messaged me and requested that pt stay another night in the hospital so that pt could receive more PT today and possibly be discharged to home.   Home PT/OT has already been ordered. Ortho has already sent Rx for opiate and zanaflex .  Nothing further medically keeping her in the hospital. Awaiting f/u with PT.

## 2023-09-13 NOTE — Assessment & Plan Note (Signed)
 On portable CXR Pt is asymptomatic Once stable can obtain 2 V CXR to further eval vs echogram

## 2023-09-13 NOTE — Assessment & Plan Note (Signed)
>>  ASSESSMENT AND PLAN FOR CLOSED RIGHT HIP FRACTURE, INITIAL ENCOUNTER (HCC) WRITTEN ON 09/13/2023 12:52 AM BY DOUTOVA, ANASTASSIA, MD  Right hip periprosthetic fracture.  Orthopedics evaluated.  Plan for consult with Dr. Lucienne Ryder in a.m. and then will discuss if needed operative intervention for now keep n.p.o. postmidnight and pain control  - management as per orthopedics,       Keep nothing by mouth post midnight. Patient  not on anticoagulation or antiplatelet agents    on hold Ordered type and screen,  order a vitamin D  level 45.09    Patient at baseline  able to walk a flight of stairs or 100 feet    Patient denies any chest pain or shortness of breath currently and/or with exertion,   ECG ordered  no known history of coronary artery disease, COPD   Liver failure or CKD  Given advanced age patient is at least moderate  risk  which has been discussed with family but at this point no furthther cardiac workup is indicated.

## 2023-09-13 NOTE — ED Provider Notes (Signed)
 I assumed care of this patient from previous provider.  Please see their note for further details of history, exam, and MDM.   Spoke with Dr. Sulema Endo from Ortho care.  He will let Dr. Lucienne Ryder know about the patient in order to coordinate evaluation in the morning to determine plan of care.  I spoke with Dr. Hendrick Locke from the hospitalist service who agreed to admit patient   Teresa Arch Camila Cecil, MD 09/13/23 0111

## 2023-09-13 NOTE — Assessment & Plan Note (Addendum)
 09-13-2023 stable. On synthroid .  09-14-2023 stable  09-15-2023 stable.

## 2023-09-13 NOTE — Assessment & Plan Note (Addendum)
 09-13-2023 has been NPO. I have called Dr. Arvella Bird office to notify him of pt's admission and need for possible ORIF.  09-14-2023 non-operative intervention has been chosen by ortho. 50% touch down weight bearing on right LE. Awaiting PT consult. Pt states she has all the DME she needs at home already. Will only need home PT/OT.  09-15-2023 home PT/OT ordered. Pt already has all equipment she needs at home. Awaiting f/u PT session today to determine if PT thinks she can go home today. Pt has lots of family members to help her at home.  *update. Pt has worked with PT. Stable for DC to home.

## 2023-09-13 NOTE — Assessment & Plan Note (Addendum)
 Right hip periprosthetic fracture.  Orthopedics evaluated.  Plan for consult with Dr. Lucienne Ryder in a.m. and then will discuss if needed operative intervention for now keep n.p.o. postmidnight and pain control  - management as per orthopedics,       Keep nothing by mouth post midnight. Patient  not on anticoagulation or antiplatelet agents    on hold Ordered type and screen,  order a vitamin D  level 45.09    Patient at baseline  able to walk a flight of stairs or 100 feet    Patient denies any chest pain or shortness of breath currently and/or with exertion,   ECG ordered  no known history of coronary artery disease, COPD   Liver failure or CKD  Given advanced age patient is at least moderate  risk  which has been discussed with family but at this point no furthther cardiac workup is indicated.

## 2023-09-13 NOTE — TOC CAGE-AID Note (Signed)
 Transition of Care [facility]) - CAGE-AID Screening   Patient Details  Name: Teresa Patterson MRN: [id number] Date of Birth: 01/01/1941  Transition of Care [facility]) CM/SW Contact:    Promise Bushong E Cypress Hinkson, LCSW Phone Number: 09/13/2023, 2:22 PM   Clinical Narrative: Met with patient at bedside. Patient denies substance use.  CAGE-AID Screening:    Have You Ever Felt You Ought to Cut Down on Your Drinking or Drug Use?: No Have People Annoyed You By Critizing Your Drinking Or Drug Use?: No Have You Felt Bad Or Guilty About Your Drinking Or Drug Use?: No Have You Ever Had a Drink or Used Drugs First Thing In The Morning to Steady Your Nerves or to Get Rid of a Hangover?: No CAGE-AID Score: 0  Substance Abuse Education Offered: No

## 2023-09-13 NOTE — Anesthesia Postprocedure Evaluation (Signed)
 Anesthesia Post Note  Patient: Teresa Patterson  Procedure(s) Performed: AN AD HOC NERVE BLOCK     Patient location during evaluation: PACU Anesthesia Type: Regional Level of consciousness: awake and alert Pain management: pain level controlled Vital Signs Assessment: post-procedure vital signs reviewed and stable Respiratory status: spontaneous breathing, nonlabored ventilation, respiratory function stable and patient connected to nasal cannula oxygen Cardiovascular status: blood pressure returned to baseline and stable Postop Assessment: no apparent nausea or vomiting Anesthetic complications: no   No notable events documented.  Last Vitals:  Vitals:   09/13/23 0930 09/13/23 0945  BP: 120/65 109/69  Pulse: 60 62  Resp: 12 13  Temp:    SpO2: 97% 92%    Last Pain:  Vitals:   09/13/23 0945  TempSrc:   PainSc: Asleep                 [name]

## 2023-09-13 NOTE — Progress Notes (Signed)
 Initial Nutrition Assessment  DOCUMENTATION CODES:   Not applicable  INTERVENTION:   - Ensure Enlive po daily, each supplement provides 350 kcal and 20 grams of protein  - MVI with minerals daily  - Encourage PO intake  NUTRITION DIAGNOSIS:   Increased nutrient needs related to hip fracture as evidenced by estimated needs.  GOAL:   Patient will meet greater than or equal to 90% of their needs  MONITOR:   PO intake, Supplement acceptance, Weight trends  REASON FOR ASSESSMENT:   Consult Hip fracture protocol  ASSESSMENT:   83 year old female who presented to the ED on 09/12/23 with hip pain after a fall. PMH of right hip replacement 1 year ago, hypothyroidism, osteoporosis. Pt admitted with right hip fracture.  Per Orthopedic Surgery note, pt's injury will be treated nonoperatively.  Spoke with pt at bedside who had just returned from receiving femoral nerve block for pain. Pt preparing to eat breakfast meal tray and reports hunger. Pt states that appetite and PO intake have been normal. She notes that she normally eats a "good breakfast, light lunch, and good dinner."  Pt reports that her UBW is 135 lbs. She reports a weight loss of 10-15 lbs over the last year but is unsure why. Pt believes majority of weight loss has been muscle mass. Pt reports no changes in eating habits. Reviewed weight history in chart. Pt with a weight loss of 2.3 kg (3.9%) from 11/29/22 to 09/12/23 which is not clinically significant for timeframe.  Discussed importance of increasing protein intake to maintain lean muscle mass. Also discussed increased nutrient needs related to hip fracture. Pt expressed understanding and is willing to try an oral nutrition supplement. RD to order along with MVI with minerals daily.  Medications reviewed and include: levothyroxine  IVF: NS @ 100 mL/hr  Labs reviewed: hemoglobin 11.0  NUTRITION - FOCUSED PHYSICAL EXAM:  Deferred as pt had just returned to room and  was eating breakfast at time of RD visit.  Diet Order:   Diet Order             Diet regular Room service appropriate? Yes; Fluid consistency: Thin  Diet effective now                   EDUCATION NEEDS:   Education needs have been addressed  Skin:  Skin Assessment: Reviewed RN Assessment  Last BM:  no documented BM  Height:   Ht Readings from Last 1 Encounters:  09/12/23 5\' 4"  (1.626 m)    Weight:   Wt Readings from Last 1 Encounters:  09/12/23 56.7 kg    BMI:  Body mass index is 21.46 kg/m.  Estimated Nutritional Needs:   Kcal:  1500-1700  Protein:  70-85 grams  Fluid:  1.5-1.7 L    Ernestina Headland, MS, RD, LDN Registered Dietitian II Please see AMiON for contact information.

## 2023-09-13 NOTE — Assessment & Plan Note (Addendum)
 09-13-2023 stable.  09-14-2023 stable  09-15-2023 stable.

## 2023-09-14 DIAGNOSIS — S72144A Nondisplaced intertrochanteric fracture of right femur, initial encounter for closed fracture: Secondary | ICD-10-CM | POA: Diagnosis not present

## 2023-09-14 DIAGNOSIS — E039 Hypothyroidism, unspecified: Secondary | ICD-10-CM

## 2023-09-14 DIAGNOSIS — D649 Anemia, unspecified: Secondary | ICD-10-CM | POA: Diagnosis not present

## 2023-09-14 MED ORDER — TIZANIDINE HCL 2 MG PO TABS: 2.0000 mg | ORAL_TABLET | Freq: Four times a day (QID) | ORAL | 0 refills | Status: DC | PRN

## 2023-09-14 MED ORDER — OXYCODONE-ACETAMINOPHEN 5-325 MG PO TABS: 1.0000 | ORAL_TABLET | Freq: Four times a day (QID) | ORAL | 0 refills | Status: DC | PRN

## 2023-09-14 NOTE — Plan of Care (Signed)

## 2023-09-14 NOTE — Progress Notes (Signed)
 PROGRESS NOTE    Teresa Patterson  JXB:[id number] DOB: [birth year] DOA: 09/12/2023 PCP: Napolean Backbone A, DO  Subjective: Pt seen and examined. Ortho consult note reviewed. Non-operative intervention has been chosen. Pt awaiting PT consult. Pt places on returning home. States she has enough family members to assist her at home.   Hospital Course: HPI: Teresa Patterson is a 83 y.o. female with medical history significant of hypothyroidism right hip fracture status post replacement, migraine, osteoporosis. Presented with mechanical fall onto right hip  Presents today via for fall was out in the yard playing with her grandson around 4 PM and fell onto right side.  Unable to ambulate on that side since no other injuries.  Did not hit her head no LOC not on blood thinners. She has a history of hip fracture on the same side about a year ago. Patient with known history of osteoporosis and hypothyroidism.   DEXA: 02/23/2022 > (-2.4-osteopenia). Rpt 2 yrs.  She was playing basketball with her Grand-great son when she fell down. She does not get any chest pain or SOB no DOE at baseline.  She is able to walk a few miles with no symptoms able to walk up a flight of stairs easily. No hx of CAD  Significant Events: Admitted 09/12/2023 right hip fracture   Significant Labs: WBC 12.7, HgB 11.5, plt 185 Na 139, K 3.6, CO2 of 26, BUN 17, Scr 0.89, glu 114 TSH 5.3 CK 158  Significant Imaging Studies: Pelvic XR Acute fracture of the greater trochanter of the proximal right femur. 2. Total right hip replacement Right femur XR Acute minimally displaced fracture of the right greater trochanter. 2. Right hip arthroplasty in stable alignment. CT right hip Previous right hip replacement with normal alignment. Acute mildly comminuted and displaced fracture periprosthetic involving the greater trochanter and subtrochanteric proximal shaft of the femur.  Antibiotic Therapy: Anti-infectives (From admission, onward)     None       Procedures:   Consultants: ortho    Assessment and Plan: * Closed intertrochanteric fracture of right femur (HCC) 09-13-2023 has been NPO. I have called Dr. Arvella Bird office to notify him of pt's admission and need for possible ORIF.  09-14-2023 non-operative intervention has been chosen by ortho. 50% touch down weight bearing on right LE. Awaiting PT consult. Pt states she has all the DME she needs at home already. Will only need home PT/OT.  Anemia 09-13-2023 stable.  09-14-2023 stable  Hypothyroidism 09-13-2023 stable. On synthroid .  09-14-2023 stable   DVT prophylaxis: SCDs Start: 09/13/23 0130    Code Status: Full Code Family Communication: no family at bedside. Pt is decisional Disposition Plan: return home Reason for continuing need for hospitalization: awaiting PT consult prior to DC.  Objective: Vitals:   09/13/23 1636 09/13/23 1927 09/14/23 0352 09/14/23 0829  BP: 107/62 110/62 122/70 108/65  Pulse:  (!) 57 64 67  Resp:  16 16 18   Temp: 98 F (36.7 C) 98.1 F (36.7 C) 98.1 F (36.7 C) 98.1 F (36.7 C)  TempSrc:      SpO2:  99% 98% 96%  Weight:      Height:       No intake or output data in the 24 hours ending 09/14/23 0937 Filed Weights   09/12/23 1928  Weight: 56.7 kg    Examination:  Physical Exam Vitals and nursing note reviewed.  Constitutional:      General: She is not in acute distress.    Appearance:  She is not toxic-appearing or diaphoretic.  HENT:     Head: Normocephalic and atraumatic.     Nose: Nose normal.  Eyes:     General: No scleral icterus. Cardiovascular:     Rate and Rhythm: Normal rate and regular rhythm.  Pulmonary:     Effort: Pulmonary effort is normal.     Breath sounds: Normal breath sounds.  Abdominal:     General: Abdomen is flat. Bowel sounds are normal.     Palpations: Abdomen is soft.  Musculoskeletal:     Right lower leg: No edema.     Left lower leg: No edema.  Skin:    General:  Skin is warm and dry.     Capillary Refill: Capillary refill takes less than 2 seconds.  Neurological:     General: No focal deficit present.     Mental Status: She is alert and oriented to person, place, and time.     Data Reviewed: I have personally reviewed following labs and imaging studies  CBC: Recent Labs  Lab 09/11/23 0857 09/12/23 1940 09/13/23 0126  WBC 6.1 12.7* 9.6  NEUTROABS  --  9.7*  --   HGB 12.9 11.5* 11.0*  HCT 38.8 35.6* 33.4*  MCV 90.6 91.5 90.8  PLT 202.0 185 152   Basic Metabolic Panel: Recent Labs  Lab 09/11/23 0857 09/12/23 1940 09/13/23 0126  NA 140 139 139  K 4.2 3.6 3.5  CL 102 104 104  CO2 31 26 25   GLUCOSE 87 114* 97  BUN 15 17 16   CREATININE 0.77 0.89 0.84  CALCIUM 10.0 10.4* 9.8  MG  --   --  2.1  PHOS  --   --  3.3   GFR: Estimated Creatinine Clearance: 43.8 mL/min (by C-G formula based on SCr of 0.84 mg/dL). Liver Function Tests: Recent Labs  Lab 09/11/23 0857  AST 14  ALT 8  ALKPHOS 50  BILITOT 0.6  PROT 6.6  ALBUMIN 4.4   Coagulation Profile: Recent Labs  Lab 09/12/23 1940  INR 1.1   Cardiac Enzymes: Recent Labs  Lab 09/13/23 0126  CKTOTAL 158   Thyroid  Function Tests: Recent Labs    09/13/23 0126  TSH 5.316*   Anemia Panel: Recent Labs    09/13/23 0126  VITAMINB12 968*  FOLATE 35.4  FERRITIN 44  TIBC 388  IRON 68  RETICCTPCT 1.9   Radiology Studies: DG Chest Port 1 View Result Date: 09/13/2023 CLINICAL DATA:  Preop right hip fracture EXAM: PORTABLE CHEST 1 VIEW COMPARISON:  10/23/2022 FINDINGS: Borderline cardiomegaly. No acute airspace disease, pleural effusion or pneumothorax IMPRESSION: No active disease. Borderline cardiomegaly. Electronically Signed   By: Esmeralda Hedge M.D.   On: 09/13/2023 00:29   CT Hip Right Wo Contrast Result Date: 09/12/2023 CLINICAL DATA:  Fall with hip pain EXAM: CT OF THE RIGHT HIP WITHOUT CONTRAST TECHNIQUE: Multidetector CT imaging of the right hip was performed  according to the standard protocol. Multiplanar CT image reconstructions were also generated. RADIATION DOSE REDUCTION: This exam was performed according to the departmental dose-optimization program which includes automated exposure control, adjustment of the mA and/or kV according to patient size and/or use of iterative reconstruction technique. COMPARISON:  09/12/2023, 10/23/2022 FINDINGS: Bones/Joint/Cartilage Previous right hip replacement with normal alignment. Acute mildly comminuted fracture involves the greater trochanter and subtrochanteric proximal shaft of the femur. There is mild fracture displacement. Right pubic ramus and acetabulum show no fracture. Right SI joint and image sacrum appear intact. Pubic symphysis is  intact. No significant hip effusion Ligaments Suboptimally assessed by CT. Muscles and Tendons No significant atrophy. Probable edema within the proximal thigh muscles. No intramuscular focal fluid collection Soft tissues Edema within the soft tissues surrounding the right hip IMPRESSION: Previous right hip replacement with normal alignment. Acute mildly comminuted and displaced fracture periprosthetic involving the greater trochanter and subtrochanteric proximal shaft of the femur. Electronically Signed   By: Esmeralda Hedge M.D.   On: 09/12/2023 23:30   DG FEMUR, MIN 2 VIEWS RIGHT Result Date: 09/12/2023 CLINICAL DATA:  Status post fall.  Leg swelling. EXAM: RIGHT FEMUR 2 VIEWS COMPARISON:  Same day radiographs of the right hip 8:08 p.m. FINDINGS: Status post right hip arthroplasty. Acute minimally displaced fracture of the inferolateral aspect of the right greater trochanter. No dislocation. Mild tricompartmental degenerative changes of the knee. Soft tissue swelling of the right hip. IMPRESSION: 1. Acute minimally displaced fracture of the right greater trochanter. 2. Right hip arthroplasty in stable alignment. Electronically Signed   By: Mannie Seek M.D.   On: 09/12/2023 22:12    DG Hip Unilat With Pelvis 2-3 Views Right Result Date: 09/12/2023 CLINICAL DATA:  Status post fall. EXAM: DG HIP (WITH OR WITHOUT PELVIS) 2-3V RIGHT COMPARISON:  March 07, 2023 FINDINGS: There is a total right hip replacement. A small, minimally displaced acute fracture is seen along the inferolateral aspect of the greater trochanter of the proximal right femur. There is no evidence of dislocation. IMPRESSION: 1. Acute fracture of the greater trochanter of the proximal right femur. 2. Total right hip replacement. Electronically Signed   By: Virgle Grime M.D.   On: 09/12/2023 21:08    Scheduled Meds:  feeding supplement  237 mL Oral Q24H   levothyroxine   100 mcg Oral Daily   multivitamin with minerals  1 tablet Oral Daily   oxybutynin   10 mg Oral Daily   Continuous Infusions:   LOS: 1 day   Time spent: 45 minutes  Unk Garb, DO  [facility]  09/14/2023, 9:37 AM

## 2023-09-14 NOTE — Discharge Instructions (Signed)
 Slowly increase your activities and mobility as comfort allows. No right hip abduction and limited right hip flexion for the next few weeks. Only put up to 50% of the most weight on your right hip. Will need follow-up with Dr. Lucienne Ryder in 1 to 2 weeks.

## 2023-09-14 NOTE — Evaluation (Signed)
 Physical Therapy Evaluation Patient Details Name: Teresa Patterson MRN: [id number] DOB: 1941-02-05 Today's Date: 09/14/2023  History of Present Illness  Pt is an 83 y/o F admitted on 09/12/23 after presenting with c/o fall after playing in the yard with her grandson. Pt found to have periprosthetic intertrochanteric fx of R femur. Ortho was consulted & recommended non-operative management. PMH: hypothyroidism, R hip f/x, migraine, osteoporosis  Clinical Impression  Pt seen for PT evaluation with co-tx with OT, pt agreeable to tx. Pt reports prior to admission she was independent without AD, living alone. Educated pt on weight bearing & ROM precautions with pt able to verbalize them at end of session without assistance. Pt requires min assist for bed mobility, as little as CGA for sit<>stand & as little as CGA for short distance gait in room. Pt does well with limiting weight bearing to <50% on RLE with education. Pt is eager to get better & return home. Will continue to follow pt acutely to address gait & stair negotiation prior to d/c home.      If plan is discharge home, recommend the following: A little help with walking and/or transfers;A little help with bathing/dressing/bathroom;Assistance with cooking/housework;Assist for transportation;Help with stairs or ramp for entrance   Can travel by private vehicle        Equipment Recommendations Rolling walker (2 wheels)  Recommendations for Other Services       Functional Status Assessment Patient has had a recent decline in their functional status and demonstrates the ability to make significant improvements in function in a reasonable and predictable amount of time.     Precautions / Restrictions Precautions Precautions: Fall Restrictions Weight Bearing Restrictions Per Provider Order: Yes RLE Weight Bearing Per Provider Order: Touchdown weight bearing (TDWB with no more than 50% weight bearing) Other Position/Activity Restrictions: No  active R hip abduction, no flexion past 90 degrees      Mobility  Bed Mobility Overal bed mobility: Needs Assistance Bed Mobility: Supine to Sit     Supine to sit: Min assist, Used rails, HOB elevated (extra time, assistance to move RLE to EOB, exits L side of bed)     General bed mobility comments: Educated pt on recommendation to exit L side of bed, enter R side of bed (pt reports she can do this at home, already exits L side of bed prior to admission)    Transfers Overall transfer level: Needs assistance Equipment used: Rolling walker (2 wheels) Transfers: Sit to/from Stand Sit to Stand: Min assist, Contact guard assist           General transfer comment: min assist fade to CGA with cuing re: hand placement    Ambulation/Gait Ambulation/Gait assistance: Contact guard assist Gait Distance (Feet): 7 Feet (+ 9 ft) Assistive device: Rolling walker (2 wheels) Gait Pattern/deviations: Step-to pattern, Decreased stride length, Decreased dorsiflexion - right, Decreased step length - right, Decreased step length - left Gait velocity: decreased     General Gait Details: PT provides education & ongoing cuing re: gait pattern with pt reporting she's putting minimal weight through RLE during LLE swing phase.  Stairs            Wheelchair Mobility     Tilt Bed    Modified Rankin (Stroke Patients Only)       Balance Overall balance assessment: Needs assistance Sitting-balance support: Feet supported Sitting balance-Leahy Scale: Good     Standing balance support: During functional activity, Bilateral upper extremity supported, Reliant on  assistive device for balance Standing balance-Leahy Scale: Fair                               Pertinent Vitals/Pain Pain Assessment Pain Assessment: Faces Faces Pain Scale: Hurts little more Pain Location: during gait, R LE/hip Pain Descriptors / Indicators: Grimacing, Discomfort Pain Intervention(s): Monitored  during session, Limited activity within patient's tolerance, Repositioned    Home Living Family/patient expects to be discharged to:: Private residence Living Arrangements: Alone Available Help at Discharge: Family;Available PRN/intermittently Type of Home: House Home Access: Stairs to enter Entrance Stairs-Rails: Right;Left (more so handles vs rails) Entrance Stairs-Number of Steps: 2   Home Layout: Two level;Able to live on main level with bedroom/bathroom Home Equipment: Shower seat;Grab bars - tub/shower;Cane - single point;BSC/3in1;Toilet riser;Standard Walker;Hand held shower head      Prior Function Prior Level of Function : Independent/Modified Independent             Mobility Comments: Independent, walks 1 mile/day, has a pool she maintains. ADLs Comments: independent with ADLs,IADLs, yard work     Extremity/Trunk Assessment   Upper Extremity Assessment Upper Extremity Assessment: Overall WFL for tasks assessed    Lower Extremity Assessment Lower Extremity Assessment: RLE deficits/detail RLE Deficits / Details: 2-/5 knee flexion in sitting       Communication   Communication Communication: No apparent difficulties    Cognition Arousal: Alert Behavior During Therapy: WFL for tasks assessed/performed   PT - Cognitive impairments: No apparent impairments                       PT - Cognition Comments: able to recall hip precautions at end of session without assistance Following commands: Intact       Cueing Cueing Techniques: Verbal cues, Gestural cues     General Comments General comments (skin integrity, edema, etc.): Pt with incontinent void during gait, assisted onto [facility] for additional continent void.    Exercises     Assessment/Plan    PT Assessment Patient needs continued PT services  PT Problem List Decreased strength;Pain;Decreased range of motion;Decreased activity tolerance;Decreased balance;Decreased mobility;Decreased  knowledge of precautions;Decreased knowledge of use of DME       PT Treatment Interventions Modalities;DME instruction;Balance training;Gait training;Neuromuscular re-education;Functional mobility training;Stair training;Patient/family education;Therapeutic activities;Therapeutic exercise;Manual techniques    PT Goals (Current goals can be found in the Care Plan section)  Acute Rehab PT Goals Patient Stated Goal: get better, go home PT Goal Formulation: With patient Time For Goal Achievement: 09/28/23 Potential to Achieve Goals: Good    Frequency Min 3X/week     Co-evaluation PT/OT/SLP Co-Evaluation/Treatment: Yes Reason for Co-Treatment:  (unsure of pt's ability to tolerate 2/2 new fx) PT goals addressed during session: Mobility/safety with mobility;Balance;Proper use of DME         AM-PAC PT "6 Clicks" Mobility  Outcome Measure Help needed turning from your back to your side while in a flat bed without using bedrails?: None Help needed moving from lying on your back to sitting on the side of a flat bed without using bedrails?: A Little Help needed moving to and from a bed to a chair (including a wheelchair)?: A Little Help needed standing up from a chair using your arms (e.g., wheelchair or bedside chair)?: A Little Help needed to walk in hospital room?: A Little Help needed climbing 3-5 steps with a railing? : A Lot 6 Click Score: 18  End of Session Equipment Utilized During Treatment: Gait belt Activity Tolerance: Patient tolerated treatment well Patient left: in chair;with call bell/phone within reach Nurse Communication: Mobility status;Precautions PT Visit Diagnosis: Pain;Other abnormalities of gait and mobility (R26.89);Difficulty in walking, not elsewhere classified (R26.2);Muscle weakness (generalized) (M62.81) Pain - Right/Left: Right Pain - part of body: Leg;Hip    Time: 3875-6433 PT Time Calculation (min) (ACUTE ONLY): 28 min   Charges:   PT  Evaluation $PT Eval Low Complexity: 1 Low   PT General Charges $$ ACUTE PT VISIT: 1 Visit         Emaline Handsome, PT, DPT 09/14/23, 10:43 AM   Venetta Gill 09/14/2023, 10:42 AM

## 2023-09-14 NOTE — TOC Progression Note (Signed)
 Transition of Care [facility]) - Progression Note    Patient Details  Name: Teresa Patterson MRN: [id number] Date of Birth: April 07, 1941  Transition of Care [facility]) CM/SW Contact  Tom-Johnson, Tove Wideman Daphne, RN Phone Number: 09/14/2023, 3:14 PM  Clinical Narrative:     CM spoke with patient at bedside about home health recommendations. Patient was active with [facility] before and requests to use their services again.  CM called in referral to [facility] with acceptance voiced, info on AVS.  RW ordered from [employer] and [facility] to deliver to patient at bedside.   Patient not Medically ready for discharge.  CM will continue to follow as patient progresses with care towards discharge.             Barriers to Discharge: Continued Medical Work up  Expected Discharge Plan and Services     Post Acute Care Choice: Home Health Living arrangements for the past 2 months: Single Family Home                   DME Agency: [facility] Date DME Agency Contacted: 09/14/23 Time DME Agency Contacted: 1510 Representative spoke with at DME Agency: Raechel Bulla HH Arranged: PT, OT HH Agency: [facility] Date [facility] Agency Contacted: 09/14/23 Time HH Agency Contacted: 1510 Representative spoke with at [facility] Agency: [facility]   Social Determinants of Health (SDOH) Interventions SDOH Screenings   Food Insecurity: No Food Insecurity (09/13/2023)  Housing: Low Risk  (09/13/2023)  Transportation Needs: No Transportation Needs (09/13/2023)  Utilities: Not At Risk (09/13/2023)  Alcohol Screen: Low Risk  (01/25/2022)  Depression (PHQ2-9): Low Risk  (11/29/2022)  Financial Resource Strain: Low Risk  (11/29/2022)  Physical Activity: Sufficiently Active (11/29/2022)  Social Connections: Moderately Isolated (09/13/2023)  Stress: No Stress Concern Present (11/29/2022)  Tobacco Use: Low Risk  (09/11/2023)    Readmission Risk Interventions    09/13/2023    2:19 PM  Readmission Risk Prevention Plan  Post Dischage  Appt Complete  Medication Screening Complete  Transportation Screening Complete

## 2023-09-14 NOTE — Progress Notes (Signed)
 Patient ID: Teresa Patterson, female   DOB: 09/21/1940, 83 y.o.   MRN: [id number] The patient has been up with therapy and is back in bed now after sitting in the bedside chair.  She said that she is having some spasms but overall is doing okay.  She was glad that we are trying to treat this conservatively with no surgery.  She is making some progress.  She understands this will take several weeks for it to really start feeling better.  I will need to see her in the office with new x-rays in 1 to 2 weeks.  I will go ahead and send in some Percocet and tizanidine  to her pharmacy.

## 2023-09-14 NOTE — Evaluation (Signed)
 Occupational Therapy Evaluation Patient Details Name: Teresa Patterson MRN: [id number] DOB: Oct 30, 1940 Today's Date: 09/14/2023   History of Present Illness   Pt is an 83 y/o F admitted on 09/12/23 after presenting with c/o fall after playing in the yard with her grandson. Pt found to have periprosthetic intertrochanteric fx of R femur. Ortho was consulted & recommended non-operative management. PMH: hypothyroidism, R hip f/x, migraine, osteoporosis     Clinical Impressions PTA, pt lives alone, typically active at baseline and completely Independent in all ADLs, IADLs and mobility without AD. Pt presents now with minor deficits in R hip pain, balance and endurance. Pt initially required Min A for bed mobility and standing but able to progress to CGA with mobility using RW. Pt requires no more than Setup assist for UB ADL and Min A for LB ADLs. Began discussing AE for LB ADLs given hip precautions w/ plans to educate on tomorrow prior to DC home. Recommend HHOT follow up with pt in agreement.      If plan is discharge home, recommend the following:   A little help with bathing/dressing/bathroom;Assistance with cooking/housework;Help with stairs or ramp for entrance     Functional Status Assessment   Patient has had a recent decline in their functional status and demonstrates the ability to make significant improvements in function in a reasonable and predictable amount of time.     Equipment Recommendations   Other (comment) (RW)     Recommendations for Other Services         Precautions/Restrictions   Precautions Precautions: Fall Restrictions Weight Bearing Restrictions Per Provider Order: Yes RLE Weight Bearing Per Provider Order:  (TDWB with no more than 50% weight bearing) Other Position/Activity Restrictions: No active R hip abduction, no flexion past 90 degrees     Mobility Bed Mobility Overal bed mobility: Needs Assistance Bed Mobility: Supine to Sit      Supine to sit: Min assist, Used rails, HOB elevated     General bed mobility comments: Educated pt on recommendation to exit L side of bed, enter R side of bed (pt reports she can do this at home, already exits L side of bed prior to admission)    Transfers Overall transfer level: Needs assistance Equipment used: Rolling walker (2 wheels) Transfers: Sit to/from Stand Sit to Stand: Min assist, Contact guard assist           General transfer comment: min assist fade to CGA with cuing re: hand placement      Balance Overall balance assessment: Needs assistance Sitting-balance support: Feet supported Sitting balance-Leahy Scale: Good     Standing balance support: During functional activity, Bilateral upper extremity supported, Reliant on assistive device for balance Standing balance-Leahy Scale: Fair                             ADL either performed or assessed with clinical judgement   ADL Overall ADL's : Needs assistance/impaired Eating/Feeding: Independent   Grooming: Supervision/safety;Standing   Upper Body Bathing: Set up;Sitting   Lower Body Bathing: Minimal assistance;Sitting/lateral leans;Sit to/from stand   Upper Body Dressing : Set up;Sitting   Lower Body Dressing: Minimal assistance;Sit to/from stand;Sitting/lateral leans   Toilet Transfer: Contact guard assist;Ambulation;Rolling walker (2 wheels) Toilet Transfer Details (indicate cue type and reason): initated mobility w/ pt reporting urinary incontinence during mobility. Retrieved and placed BSC behind pt to finish task Toileting- Architect and Hygiene: Sit to/from stand;Sitting/lateral lean;Supervision/safety Toileting -  Clothing Manipulation Details (indicate cue type and reason): able to wash upper LE and peri region seated on BSC     Functional mobility during ADLs: Contact guard assist;Rolling walker (2 wheels) General ADL Comments: Provided initial education on AE and provided  handout as pt may need this d/t hip precautions. Plan to bring AE for pt to trial in next session. Educated on where to purchase and addtionally wrote precautions on same sheet to maximize carryover.     Vision Ability to See in Adequate Light: 0 Adequate Patient Visual Report: No change from baseline Vision Assessment?: No apparent visual deficits     Perception         Praxis         Pertinent Vitals/Pain Pain Assessment Pain Assessment: Faces Faces Pain Scale: Hurts little more Pain Location: during gait, R LE/hip Pain Descriptors / Indicators: Grimacing, Discomfort Pain Intervention(s): Monitored during session, Premedicated before session     Extremity/Trunk Assessment Upper Extremity Assessment Upper Extremity Assessment: Overall WFL for tasks assessed   Lower Extremity Assessment Lower Extremity Assessment: Defer to PT evaluation RLE Deficits / Details: 2-/5 knee flexion in sitting   Cervical / Trunk Assessment Cervical / Trunk Assessment: Normal   Communication Communication Communication: No apparent difficulties   Cognition Arousal: Alert Behavior During Therapy: WFL for tasks assessed/performed Cognition: No apparent impairments                               Following commands: Intact       Cueing  General Comments   Cueing Techniques: Verbal cues;Gestural cues  Pt with incontinent void during gait, assisted onto [facility] for additional continent void.   Exercises     Shoulder Instructions      Home Living Family/patient expects to be discharged to:: Private residence Living Arrangements: Alone Available Help at Discharge: Family;Available PRN/intermittently Type of Home: House Home Access: Stairs to enter [employer] of Steps: 2 Entrance Stairs-Rails: Right;Left Home Layout: Two level;Able to live on main level with bedroom/bathroom     Bathroom Shower/Tub: Producer, television/film/video: Standard     Home  Equipment: Shower seat;Grab bars - tub/shower;Cane - single point;BSC/3in1;Toilet riser;Standard Walker;Hand held shower head          Prior Functioning/Environment Prior Level of Function : Independent/Modified Independent             Mobility Comments: Independent, walks 1 mile/day, has a pool she maintains. ADLs Comments: independent with ADLs,IADLs, yard work    OT Problem List: Decreased activity tolerance;Impaired balance (sitting and/or standing);Pain;Decreased knowledge of use of DME or AE;Decreased knowledge of precautions   OT Treatment/Interventions: Self-care/ADL training;Therapeutic exercise;Energy conservation;DME and/or AE instruction;Therapeutic activities;Patient/family education;Balance training      OT Goals(Current goals can be found in the care plan section)   Acute Rehab OT Goals Patient Stated Goal: not have to go to a rehab, stay another day to work with therapy here OT Goal Formulation: With patient Time For Goal Achievement: 09/28/23 Potential to Achieve Goals: Good   OT Frequency:  Min 2X/week    Co-evaluation   Reason for Co-Treatment:  (unsure of pt's ability to tolerate 2/2 new fx) PT goals addressed during session: Mobility/safety with mobility;Balance;Proper use of DME        AM-PAC OT "6 Clicks" Daily Activity     Outcome Measure Help from another person eating meals?: None Help from another person taking care  of personal grooming?: A Little Help from another person toileting, which includes using toliet, bedpan, or urinal?: A Little Help from another person bathing (including washing, rinsing, drying)?: A Little Help from another person to put on and taking off regular upper body clothing?: A Little Help from another person to put on and taking off regular lower body clothing?: A Little 6 Click Score: 19   End of Session Equipment Utilized During Treatment: Gait belt;Rolling walker (2 wheels)  Activity Tolerance: Patient tolerated  treatment well Patient left: in chair;with call bell/phone within reach  OT Visit Diagnosis: Other abnormalities of gait and mobility (R26.89);Pain Pain - Right/Left: Right Pain - part of body: Hip                Time: 7829-5621 OT Time Calculation (min): 33 min Charges:  OT General Charges $OT Visit: 1 Visit OT Evaluation $OT Eval Moderate Complexity: 1 Mod  Lawrence Pretty, OTR/L Acute Rehab Services Office: [phone]   Annabella Barr 09/14/2023, 11:31 AM

## 2023-09-15 DIAGNOSIS — D649 Anemia, unspecified: Secondary | ICD-10-CM | POA: Diagnosis not present

## 2023-09-15 DIAGNOSIS — E039 Hypothyroidism, unspecified: Secondary | ICD-10-CM | POA: Diagnosis not present

## 2023-09-15 DIAGNOSIS — S72144A Nondisplaced intertrochanteric fracture of right femur, initial encounter for closed fracture: Secondary | ICD-10-CM | POA: Diagnosis not present

## 2023-09-15 MED ORDER — BISACODYL 5 MG PO TBEC
10.0000 mg | DELAYED_RELEASE_TABLET | Freq: Once | ORAL | Status: AC
  Administered 2023-09-15: 10 mg via ORAL
  Filled 2023-09-15: qty 2

## 2023-09-15 MED ORDER — POLYETHYLENE GLYCOL 3350 17 G PO PACK
17.0000 g | PACK | Freq: Once | ORAL | Status: AC
  Administered 2023-09-15: 17 g via ORAL
  Filled 2023-09-15: qty 1

## 2023-09-15 NOTE — TOC Transition Note (Signed)
 Transition of Care [facility]) - Discharge Note   Patient Details  Name: Teresa Patterson MRN: [id number] Date of Birth: 1940/06/29  Transition of Care [facility]) CM/SW Contact:  Omie Bickers, RN Phone Number: 09/15/2023, 2:54 PM   Clinical Narrative:     Notified Baruch Likens w [facility] that patient will DC today   Final next level of care: Home w Home Health Services Barriers to Discharge: No Barriers Identified   Patient Goals and CMS Choice Patient states their goals for this hospitalization and ongoing recovery are:: To return home CMS Medicare.gov Compare Post Acute Care list provided to:: Patient Choice offered to / list presented to : Patient      Discharge Placement                       Discharge Plan and Services Additional resources added to the After Visit Summary for       Post Acute Care Choice: Home Health            DME Agency: [employer] Date DME Agency Contacted: 09/14/23 Time DME Agency Contacted: [id number] Representative spoke with at DME Agency: Raechel Bulla HH Arranged: PT, OT HH Agency: [facility] Date [facility] Agency Contacted: 09/15/23 Time HH Agency Contacted: [id number] Representative spoke with at [facility] Agency: Baruch Likens  Social Drivers of Health (SDOH) Interventions SDOH Screenings   Food Insecurity: No Food Insecurity (09/13/2023)  Housing: Low Risk  (09/13/2023)  Transportation Needs: No Transportation Needs (09/13/2023)  Utilities: Not At Risk (09/13/2023)  Alcohol Screen: Low Risk  (01/25/2022)  Depression (PHQ2-9): Low Risk  (11/29/2022)  Financial Resource Strain: Low Risk  (11/29/2022)  Physical Activity: Sufficiently Active (11/29/2022)  Social Connections: Moderately Isolated (09/13/2023)  Stress: No Stress Concern Present (11/29/2022)  Tobacco Use: Low Risk  (09/11/2023)     Readmission Risk Interventions    09/13/2023    2:19 PM  Readmission Risk Prevention Plan  Post Dischage Appt Complete  Medication Screening Complete  Transportation Screening  Complete

## 2023-09-15 NOTE — Progress Notes (Signed)
 Discharge orders and paperwork reviewed with patient. One of the daughter's of patient are in route to transport her home. Made aware of new medication orders were called in by Provider at their pharmacy. Pt having question about the Provider talking to her about using acetaminophen  extra strength. Reviewed with patient medication instructions for PRN pain medication time able to take next dose. Also, explained PRN medication has acetaminophen  and to not exceed 4,000 mg of acetaminophen  per the day.

## 2023-09-15 NOTE — Progress Notes (Signed)
 Occupational Therapy Treatment Patient Details Name: TAIZ HORTON MRN: [id number] DOB: [birth year] Today's Date: 09/15/2023   History of present illness Pt is an 83 y/o F admitted on 09/12/23 after presenting with c/o fall after playing in the yard with her grandson. Pt found to have periprosthetic intertrochanteric fx of R femur. Ortho was consulted & recommended non-operative management. PMH: hypothyroidism, R hip f/x, migraine, osteoporosis   OT comments  OT session focused on education in use of AE for LB dressing bathing while adhering to R hip precautions. Pt verbalized and demonstrated understanding of use of reacher, sock aid, long handled sponge, and long handled shoe horn. Pt will benefit from reinforcement of education. Pt currently demonstrates ability to complete LB dressing and bathing with use of AE with Supervision in sitting/lateral leans and Contact guard assist in sit/stand, all while adhering to precautions. Pt also demonstrating ability to complete bed mobility in preparation for functional tasks with Min assist and functional mobility from bed to recliner with Contact guard assist with use of RW while adhering to precautions. Pt participated well in session and is making progress toward OT goals. Pt will benefit from continued acute skilled OT services. Post acute discharge, pt will benefit from [facility] OT to maximize rehab potential, decrease risk of falls, and decrease risk of rehospitalization.        If plan is discharge home, recommend the following:  A little help with walking and/or transfers;A little help with bathing/dressing/bathroom;Assistance with cooking/housework;Assist for transportation;Help with stairs or ramp for entrance   Equipment Recommendations  Other (comment) (RW; sock aid; reacher; long handled sponge or long handled bath brush)    Recommendations for Other Services      Precautions / Restrictions Precautions Precautions: Fall Restrictions Weight  Bearing Restrictions Per Provider Order: Yes RLE Weight Bearing Per Provider Order: Touchdown weight bearing (touchdown weightbearing to up to 50% for balance only) Other Position/Activity Restrictions: No active R hip abduction, no flexion past 90 degrees       Mobility Bed Mobility Overal bed mobility: Needs Assistance Bed Mobility: Supine to Sit     Supine to sit: Min assist, HOB elevated, Used rails     General bed mobility comments: exiting on L side of bed; Min assist to manage R LE due to pain    Transfers Overall transfer level: Needs assistance Equipment used: Rolling walker (2 wheels) Transfers: Sit to/from Stand, Bed to chair/wheelchair/BSC Sit to Stand: Contact guard assist     Step pivot transfers: Contact guard assist     General transfer comment: with increased time; no cues needed for hand placeement/technique this session     Balance Overall balance assessment: Needs assistance Sitting-balance support: Single extremity supported, Bilateral upper extremity supported, No upper extremity supported, Feet supported Sitting balance-Leahy Scale: Good     Standing balance support: Bilateral upper extremity supported, During functional activity, Reliant on assistive device for balance Standing balance-Leahy Scale: Fair                             ADL either performed or assessed with clinical judgement   ADL Overall ADL's : Needs assistance/impaired             Lower Body Bathing: Supervison/ safety;Contact guard assist;Sitting/lateral leans;Sit to/from stand;Adhering to hip precautions;With adaptive equipment;Cueing for compensatory techniques Lower Body Bathing Details (indicate cue type and reason): simulated at EOB; with use of long handled sponge  Lower Body Dressing: Supervision/safety;Contact guard assist;Sitting/lateral leans;Sit to/from stand;Adhering to hip precautions;Cueing for compensatory techniques;With adaptive  equipment Lower Body Dressing Details (indicate cue type and reason): with use of reacher, sock aid, and long handled shoe horn; pt declined trial of dressing stick               General ADL Comments: OT educated pt in use of AE for LB dressing and bathing with pt verbalizing and demonstrating understanding through teach back.    Extremity/Trunk Assessment Upper Extremity Assessment Upper Extremity Assessment: Overall WFL for tasks assessed            Vision       Perception     Praxis     Communication Communication Communication: No apparent difficulties   Cognition Arousal: Alert Behavior During Therapy: WFL for tasks assessed/performed Cognition: No apparent impairments                               Following commands: Intact        Cueing   Cueing Techniques: Verbal cues, Gestural cues, Visual cues  Exercises      Shoulder Instructions       General Comments      Pertinent Vitals/ Pain       Pain Assessment Pain Assessment: Faces Faces Pain Scale: Hurts little more Pain Location: during gait and bed mobility, R LE/hip Pain Descriptors / Indicators: Grimacing, Discomfort, Guarding Pain Intervention(s): Limited activity within patient's tolerance, Monitored during session, Repositioned  Home Living                                          Prior Functioning/Environment              Frequency  Min 2X/week        Progress Toward Goals  OT Goals(current goals can now be found in the care plan section)  Progress towards OT goals: Progressing toward goals  Acute Rehab OT Goals Patient Stated Goal: to return home with family and conitnue with rehab services in the home  Plan      Co-evaluation                 AM-PAC OT "6 Clicks" Daily Activity     Outcome Measure   Help from another person eating meals?: None Help from another person taking care of personal grooming?: A Little Help from  another person toileting, which includes using toliet, bedpan, or urinal?: A Little Help from another person bathing (including washing, rinsing, drying)?: A Little Help from another person to put on and taking off regular upper body clothing?: A Little Help from another person to put on and taking off regular lower body clothing?: A Little 6 Click Score: 19    End of Session Equipment Utilized During Treatment: Rolling walker (2 wheels);Gait belt;Other (comment) (sock aid, reacher, long handled shoe horn, long handled sponge)  OT Visit Diagnosis: Other abnormalities of gait and mobility (R26.89);Pain   Activity Tolerance Patient tolerated treatment well   Patient Left in chair;with call bell/phone within reach;with chair alarm set   Nurse Communication Mobility status;Other (comment) (OT educating pt in use of AE for LB dressing/bathing)        Time: 9563-8756 OT Time Calculation (min): 26 min  Charges: OT General Charges $OT Visit: 1 Visit  OT Treatments $Self Care/Home Management : 23-37 mins  Dayami Taitt "Darral Ellis., OTR/L, MA Acute Rehab [phone]   Walt Gunner 09/15/2023, 1:46 PM

## 2023-09-15 NOTE — Progress Notes (Signed)
 Assisted patient using the commode. Stand pivot aiding patient in stabilizing gait with LLE. After voiding and using the BSC observed pt pushing self up and distributing more weight with RLE. Reinforced on-going education about transfer and teach-back completed with patient about RLE being WBAT. Pt endorsed about RLE being 50% or less weight. Given PRN medication for pain and repositioned in bed. No further issues or concerns to report at this time.

## 2023-09-15 NOTE — Plan of Care (Signed)
  Problem: Clinical Measurements: Goal: Ability to maintain clinical measurements within normal limits will improve Outcome: Progressing   Problem: Activity: Goal: Risk for activity intolerance will decrease Outcome: Progressing   Problem: Nutrition: Goal: Adequate nutrition will be maintained Outcome: Progressing   Problem: Elimination: Goal: Will not experience complications related to bowel motility Outcome: Progressing   

## 2023-09-15 NOTE — Discharge Summary (Signed)
 [facility] Phys[facility] Discharge Summary   Patient name: Teresa Patterson  Admit date:     09/12/2023  Discharge date: 09/15/2023  Attending Physician: Teresa Patterson [[id number]]  Discharge Physician: Teresa Patterson   PCP: Teresa Shope, DO  Admitted From: Home  Disposition:  Home  Recommendations for Outpatient Follow-up:  Follow up with PCP in 1-2 weeks F/U with orthopedics in 2 weeks for repeat xrays  Home Health:Yes. Home PT/OT Equipment/Devices: None  Discharge Condition:Stable CODE STATUS:FULL Diet recommendation: Regular Fluid Restriction: None  Hospital Summary: HPI: Teresa Patterson is a 83 y.o. female with medical history significant of hypothyroidism right hip fracture status post replacement, migraine, osteoporosis. Presented with mechanical fall onto right hip  Presents today via for fall was out in the yard playing with her grandson around 4 PM and fell onto right side.  Unable to ambulate on that side since no other injuries.  Did not hit her head no LOC not on blood thinners. She has a history of hip fracture on the same side about a year ago. Patient with known history of osteoporosis and hypothyroidism.   DEXA: 02/23/2022 > (-2.4-osteopenia). Rpt 2 yrs.  She was playing basketball with her Grand-great son when she fell down. She does not get any chest pain or SOB no DOE at baseline.  She is able to walk a few miles with no symptoms able to walk up a flight of stairs easily. No hx of CAD  Significant Events: Admitted 09/12/2023 right hip fracture   Significant Labs: WBC 12.7, HgB 11.5, plt 185 Na 139, K 3.6, CO2 of 26, BUN 17, Scr 0.89, glu 114 TSH 5.3 CK 158  Significant Imaging Studies: Pelvic XR Acute fracture of the greater trochanter of the proximal right femur. 2. Total right hip replacement Right femur XR Acute minimally displaced fracture of the right greater trochanter. 2. Right hip arthroplasty in stable alignment. CT right hip Previous right hip  replacement with normal alignment. Acute mildly comminuted and displaced fracture periprosthetic involving the greater trochanter and subtrochanteric proximal shaft of the femur.  Antibiotic Therapy: Anti-infectives (From admission, onward)    None       Procedures:   Consultants: ortho   Hospital Course by Problem: * Closed intertrochanteric fracture of right femur (HCC) 09-13-2023 has been NPO. I have called Teresa Patterson office to notify him of pt's admission and need for possible ORIF.  09-14-2023 non-operative intervention has been chosen by ortho. 50% touch down weight bearing on right LE. Awaiting PT consult. Pt states she has all the DME she needs at home already. Will only need home PT/OT.  09-15-2023 home PT/OT ordered. Pt already has all equipment she needs at home. Awaiting f/u PT session today to determine if PT thinks she can go home today. Pt has lots of family members to help her at home.  *update. Pt has worked with PT. Stable for DC to home.  Anemia 09-13-2023 stable.  09-14-2023 stable  09-15-2023 stable.  Hypothyroidism 09-13-2023 stable. On synthroid .  09-14-2023 stable  09-15-2023 stable.    Discharge Diagnoses:  Principal Problem:   Closed intertrochanteric fracture of right femur (HCC) Active Problems:   Hypothyroidism   Anemia   Nondisplaced fracture of greater trochanter of right femur, initial encounter for closed fracture [facility])   Discharge Instructions  Discharge Instructions     Call MD for:  difficulty breathing, headache or visual disturbances   Complete by: As directed    Call MD for:  extreme fatigue   Complete by: As directed    Call MD for:  hives   Complete by: As directed    Call MD for:  persistant dizziness or light-headedness   Complete by: As directed    Call MD for:  persistant nausea and vomiting   Complete by: As directed    Call MD for:  severe uncontrolled pain   Complete by: As directed    Call MD for:   temperature >100.4   Complete by: As directed    Diet general   Complete by: As directed    Discharge instructions   Complete by: As directed    1. Follow up with your primary care provider in 1-2 weeks following discharge from hospital. 2. Follow with Teresa Patterson with orthopedics in 1-2 weeks for repeat xrays.   Increase activity slowly   Complete by: As directed       Allergies as of 09/15/2023   No Known Allergies      Medication List     TAKE these medications    aspirin  81 MG chewable tablet Chew 1 tablet (81 mg total) by mouth 2 (two) times daily. What changed: when to take this   Co Q 10 100 MG Caps Take 1 capsule by mouth every evening.   conjugated estrogens  vaginal cream Commonly known as: PREMARIN  1 applicator of cream  intravaginally 1-2 weekly qhs   cyanocobalamin  1000 MCG tablet Take 1 tablet (1,000 mcg total) by mouth daily.   fluticasone  50 MCG/ACT nasal spray Commonly known as: FLONASE  Place 2 sprays into both nostrils daily. What changed:  when to take this reasons to take this   hydrochlorothiazide  25 MG tablet Commonly known as: HYDRODIURIL  Take 1 tablet (25 mg total) by mouth daily.   levothyroxine  100 MCG tablet Commonly known as: SYNTHROID  Take 1 tablet (100 mcg total) by mouth daily.   multivitamin tablet Take 1 tablet by mouth every evening.   oxybutynin  10 MG 24 hr tablet Commonly known as: DITROPAN -XL Take 10 mg by mouth daily.   oxyCODONE -acetaminophen  5-325 MG tablet Commonly known as: PERCOCET/ROXICET Take 1-2 tablets by mouth every 6 (six) hours as needed for severe pain (pain score 7-10). No more than 6 tablets per day.   SUMAtriptan  5 MG/ACT nasal spray Commonly known as: IMITREX  Place 1 spray (5 mg total) into the nose every 2 (two) hours as needed.   tiZANidine  2 MG tablet Commonly known as: ZANAFLEX  Take 1 tablet (2 mg total) by mouth every 6 (six) hours as needed for muscle spasms.                Durable Medical Equipment  (From admission, onward)           Start     Ordered   09/14/23 1535  For home use only DME Walker rolling  Once       Question Answer Comment  Walker: With 5 Inch Wheels   Patient needs a walker to treat with the following condition Gait instability      09/14/23 1534            Follow-up Information     [facility].   Specialty: Home Health Services Why: Someone will call you to schedule first home visit. Contact information: [address] [place] [address] [phone]         Arnie Lao, MD. Schedule an appointment as soon as possible for a visit in  2 week(s).   Specialty: Orthopedic Surgery Contact information: [address] [place]  [place] [address] [phone]                No Known Allergies  Discharge Exam: Vitals:   09/15/23 0522 09/15/23 0900  BP: 109/63 117/72  Pulse: 70 63  Resp: 18 18  Temp: 98.1 F (36.7 C) 98 F (36.7 C)  SpO2: 99% 97%    Physical Exam Vitals and nursing note reviewed.  Constitutional:      General: She is not in acute distress.    Appearance: She is not toxic-appearing or diaphoretic.  HENT:     Head: Normocephalic and atraumatic.     Nose: Nose normal.  Eyes:     General: No scleral icterus. Cardiovascular:     Rate and Rhythm: Normal rate and regular rhythm.  Pulmonary:     Effort: Pulmonary effort is normal.     Breath sounds: Normal breath sounds.  Abdominal:     General: Abdomen is flat. Bowel sounds are normal.     Palpations: Abdomen is soft.  Skin:    Capillary Refill: Capillary refill takes less than 2 seconds.  Neurological:     General: No focal deficit present.     Mental Status: She is alert and oriented to person, place, and time.     The results of significant diagnostics from this hospitalization (including imaging, microbiology, ancillary and laboratory) are listed below for reference.    Labs: Basic  Metabolic Panel: Recent Labs  Lab 09/11/23 0857 09/12/23 1940 09/13/23 0126  NA 140 139 139  K 4.2 3.6 3.5  CL 102 104 104  CO2 31 26 25   GLUCOSE 87 114* 97  BUN 15 17 16   CREATININE 0.77 0.89 0.84  CALCIUM 10.0 10.4* 9.8  MG  --   --  2.1  PHOS  --   --  3.3   Liver Function Tests: Recent Labs  Lab 09/11/23 0857  AST 14  ALT 8  ALKPHOS 50  BILITOT 0.6  PROT 6.6  ALBUMIN 4.4   CBC: Recent Labs  Lab 09/11/23 0857 09/12/23 1940 09/13/23 0126  WBC 6.1 12.7* 9.6  NEUTROABS  --  9.7*  --   HGB 12.9 11.5* 11.0*  HCT 38.8 35.6* 33.4*  MCV 90.6 91.5 90.8  PLT 202.0 185 152   Cardiac Enzymes: Recent Labs  Lab 09/13/23 0126  CKTOTAL 158   Thyroid  function studies Recent Labs    09/13/23 0126  TSH 5.316*   Anemia work up Recent Labs    09/13/23 0126  VITAMINB12 968*  FOLATE 35.4  FERRITIN 44  TIBC 388  IRON 68  RETICCTPCT 1.9   Urinalysis    Component Value Date/Time   COLORURINE YELLOW 07/29/2019 0935   APPEARANCEUR CLEAR 07/29/2019 0935   LABSPEC 1.010 07/29/2019 0935   PHURINE 7.0 07/29/2019 0935   GLUCOSEU NEGATIVE 07/29/2019 0935   HGBUR NEGATIVE 07/29/2019 0935   HGBUR negative 06/16/2009 1003   BILIRUBINUR n 07/18/2017 1259   KETONESUR NEGATIVE 07/29/2019 0935   PROTEINUR NEGATIVE 07/29/2019 0935   UROBILINOGEN 0.2 07/18/2017 1259   UROBILINOGEN 0.2 06/16/2009 1003   NITRITE n 07/18/2017 1259   NITRITE negative 06/16/2009 1003   LEUKOCYTESUR Negative 07/18/2017 1259   Sepsis Labs Recent Labs  Lab 09/11/23 0857 09/12/23 1940 09/13/23 0126  WBC 6.1 12.7* 9.6    Procedures/Studies: DG Chest Port 1 View Result Date: 09/13/2023 CLINICAL DATA:  Preop right hip fracture EXAM: PORTABLE CHEST  1 VIEW COMPARISON:  10/23/2022 FINDINGS: Borderline cardiomegaly. No acute airspace disease, pleural effusion or pneumothorax IMPRESSION: No active disease. Borderline cardiomegaly. Electronically Signed   By: Esmeralda Hedge M.D.   On: 09/13/2023  00:29   CT Hip Right Wo Contrast Result Date: 09/12/2023 CLINICAL DATA:  Fall with hip pain EXAM: CT OF THE RIGHT HIP WITHOUT CONTRAST TECHNIQUE: Multidetector CT imaging of the right hip was performed according to the standard protocol. Multiplanar CT image reconstructions were also generated. RADIATION DOSE REDUCTION: This exam was performed according to the departmental dose-optimization program which includes automated exposure control, adjustment of the mA and/or kV according to patient size and/or use of iterative reconstruction technique. COMPARISON:  09/12/2023, 10/23/2022 FINDINGS: Bones/Joint/Cartilage Previous right hip replacement with normal alignment. Acute mildly comminuted fracture involves the greater trochanter and subtrochanteric proximal shaft of the femur. There is mild fracture displacement. Right pubic ramus and acetabulum show no fracture. Right SI joint and image sacrum appear intact. Pubic symphysis is intact. No significant hip effusion Ligaments Suboptimally assessed by CT. Muscles and Tendons No significant atrophy. Probable edema within the proximal thigh muscles. No intramuscular focal fluid collection Soft tissues Edema within the soft tissues surrounding the right hip IMPRESSION: Previous right hip replacement with normal alignment. Acute mildly comminuted and displaced fracture periprosthetic involving the greater trochanter and subtrochanteric proximal shaft of the femur. Electronically Signed   By: Esmeralda Hedge M.D.   On: 09/12/2023 23:30   DG FEMUR, MIN 2 VIEWS RIGHT Result Date: 09/12/2023 CLINICAL DATA:  Status post fall.  Leg swelling. EXAM: RIGHT FEMUR 2 VIEWS COMPARISON:  Same day radiographs of the right hip 8:08 p.m. FINDINGS: Status post right hip arthroplasty. Acute minimally displaced fracture of the inferolateral aspect of the right greater trochanter. No dislocation. Mild tricompartmental degenerative changes of the knee. Soft tissue swelling of the right hip.  IMPRESSION: 1. Acute minimally displaced fracture of the right greater trochanter. 2. Right hip arthroplasty in stable alignment. Electronically Signed   By: Mannie Seek M.D.   On: 09/12/2023 22:12   DG Hip Unilat With Pelvis 2-3 Views Right Result Date: 09/12/2023 CLINICAL DATA:  Status post fall. EXAM: DG HIP (WITH OR WITHOUT PELVIS) 2-3V RIGHT COMPARISON:  March 07, 2023 FINDINGS: There is a total right hip replacement. A small, minimally displaced acute fracture is seen along the inferolateral aspect of the greater trochanter of the proximal right femur. There is no evidence of dislocation. IMPRESSION: 1. Acute fracture of the greater trochanter of the proximal right femur. 2. Total right hip replacement. Electronically Signed   By: Virgle Grime M.D.   On: 09/12/2023 21:08    Time coordinating discharge: 50 mins  SIGNED:  Unk Garb, DO [facility] 09/15/23, 2:32 PM

## 2023-09-15 NOTE — Progress Notes (Signed)
 Family arrived. Assisted patient into car. Reviewed with daughter of patient transferring her mom from the chair to [facility]. Also, transferring from standing position into vehicle. Utilizing gait belt and walker. Reviewed discharge instructions with the daughter and answered questions regarding PRN medication time for next dose. No further questions or issues to report. Given PRN medication per verbal order by Provider prior to discharge.

## 2023-09-15 NOTE — Progress Notes (Signed)
 PT ASSISTED TO BSC THIS SHIFT. GAIT IS STEADY AND SLOW. HAS BEEN MEDICATED  X1 FOR C/O PAIN WITH RELIEF NOTED. CALL LIGHT IN REACH. SR X3 ELEVATED. BED IN LOW POSITION.

## 2023-09-15 NOTE — Progress Notes (Signed)
 PROGRESS NOTE    Teresa Patterson  UYQ:[id number] DOB: Mar 15, 1941 DOA: 09/12/2023 PCP: Napolean Backbone A, DO  Subjective: Pt seen and examined. Pain controlled. Pt not discharged yesterday after PT provider messaged me and requested that pt stay another night in the hospital so that pt could receive more PT today and possibly be discharged to home.   Home PT/OT has already been ordered. Ortho has already sent Rx for opiate and zanaflex .  Nothing further medically keeping her in the hospital. Awaiting f/u with PT.   Hospital Course: HPI: Teresa Patterson is a 83 y.o. female with medical history significant of hypothyroidism right hip fracture status post replacement, migraine, osteoporosis. Presented with mechanical fall onto right hip  Presents today via for fall was out in the yard playing with her grandson around 4 PM and fell onto right side.  Unable to ambulate on that side since no other injuries.  Did not hit her head no LOC not on blood thinners. She has a history of hip fracture on the same side about a year ago. Patient with known history of osteoporosis and hypothyroidism.   DEXA: 02/23/2022 > (-2.4-osteopenia). Rpt 2 yrs.  She was playing basketball with her Grand-great son when she fell down. She does not get any chest pain or SOB no DOE at baseline.  She is able to walk a few miles with no symptoms able to walk up a flight of stairs easily. No hx of CAD  Significant Events: Admitted 09/12/2023 right hip fracture   Significant Labs: WBC 12.7, HgB 11.5, plt 185 Na 139, K 3.6, CO2 of 26, BUN 17, Scr 0.89, glu 114 TSH 5.3 CK 158  Significant Imaging Studies: Pelvic XR Acute fracture of the greater trochanter of the proximal right femur. 2. Total right hip replacement Right femur XR Acute minimally displaced fracture of the right greater trochanter. 2. Right hip arthroplasty in stable alignment. CT right hip Previous right hip replacement with normal alignment. Acute mildly  comminuted and displaced fracture periprosthetic involving the greater trochanter and subtrochanteric proximal shaft of the femur.  Antibiotic Therapy: Anti-infectives (From admission, onward)    None       Procedures:   Consultants: ortho    Assessment and Plan: * Closed intertrochanteric fracture of right femur (HCC) 09-13-2023 has been NPO. I have called Dr. Arvella Bird office to notify him of pt's admission and need for possible ORIF.  09-14-2023 non-operative intervention has been chosen by ortho. 50% touch down weight bearing on right LE. Awaiting PT consult. Pt states she has all the DME she needs at home already. Will only need home PT/OT.  09-15-2023 home PT/OT ordered. Pt already has all equipment she needs at home. Awaiting f/u PT session today to determine if PT thinks she can go home today. Pt has lots of family members to help her at home.  Anemia 09-13-2023 stable.  09-14-2023 stable  09-15-2023 stable.  Hypothyroidism 09-13-2023 stable. On synthroid .  09-14-2023 stable  09-15-2023 stable.  DVT prophylaxis: SCDs Start: 09/13/23 0130    Code Status: Full Code Family Communication: no family members at bedside Disposition Plan: return home Reason for continuing need for hospitalization: medically stable for DC  Objective: Vitals:   09/14/23 0829 09/14/23 1444 09/14/23 2051 09/15/23 0522  BP: 108/65 113/62 118/64 109/63  Pulse: 67 72 68 70  Resp: 18 18 18 18   Temp: 98.1 F (36.7 C) 97.9 F (36.6 C) 98.2 F (36.8 C) 98.1 F (36.7 C)  TempSrc:  Oral Oral  SpO2: 96% 98% 100% 99%  Weight:      Height:        Intake/Output Summary (Last 24 hours) at 09/15/2023 0912 Last data filed at 09/14/2023 1255 Gross per 24 hour  Intake --  Output 600 ml  Net -600 ml   Filed Weights   09/12/23 1928  Weight: 56.7 kg    Examination:  Physical Exam Vitals and nursing note reviewed.  Constitutional:      General: She is not in acute distress.     Appearance: She is not toxic-appearing or diaphoretic.  HENT:     Head: Normocephalic and atraumatic.     Nose: Nose normal.  Eyes:     General: No scleral icterus. Cardiovascular:     Rate and Rhythm: Normal rate and regular rhythm.  Pulmonary:     Effort: Pulmonary effort is normal.     Breath sounds: Normal breath sounds.  Abdominal:     General: Abdomen is flat. Bowel sounds are normal.     Palpations: Abdomen is soft.  Skin:    Capillary Refill: Capillary refill takes less than 2 seconds.  Neurological:     General: No focal deficit present.     Mental Status: She is alert and oriented to person, place, and time.     Data Reviewed: I have personally reviewed following labs and imaging studies  CBC: Recent Labs  Lab 09/11/23 0857 09/12/23 1940 09/13/23 0126  WBC 6.1 12.7* 9.6  NEUTROABS  --  9.7*  --   HGB 12.9 11.5* 11.0*  HCT 38.8 35.6* 33.4*  MCV 90.6 91.5 90.8  PLT 202.0 185 152   Basic Metabolic Panel: Recent Labs  Lab 09/11/23 0857 09/12/23 1940 09/13/23 0126  NA 140 139 139  K 4.2 3.6 3.5  CL 102 104 104  CO2 31 26 25   GLUCOSE 87 114* 97  BUN 15 17 16   CREATININE 0.77 0.89 0.84  CALCIUM 10.0 10.4* 9.8  MG  --   --  2.1  PHOS  --   --  3.3   GFR: Estimated Creatinine Clearance: 43.8 mL/min (by C-G formula based on SCr of 0.84 mg/dL). Liver Function Tests: Recent Labs  Lab 09/11/23 0857  AST 14  ALT 8  ALKPHOS 50  BILITOT 0.6  PROT 6.6  ALBUMIN 4.4   Coagulation Profile: Recent Labs  Lab 09/12/23 1940  INR 1.1   Cardiac Enzymes: Recent Labs  Lab 09/13/23 0126  CKTOTAL 158   Thyroid  Function Tests: Recent Labs    09/13/23 0126  TSH 5.316*   Anemia Panel: Recent Labs    09/13/23 0126  VITAMINB12 968*  FOLATE 35.4  FERRITIN 44  TIBC 388  IRON 68  RETICCTPCT 1.9   Radiology Studies: No results found.  Scheduled Meds:  feeding supplement  237 mL Oral Q24H   levothyroxine   100 mcg Oral Daily   multivitamin with  minerals  1 tablet Oral Daily   oxybutynin   10 mg Oral Daily   Continuous Infusions:   LOS: 2 days   Time spent: 40 minutes  Unk Garb, DO  [facility]  09/15/2023, 9:12 AM

## 2023-09-15 NOTE — Progress Notes (Signed)
 Elevated heels. Refused cough and deep breathing. No pain at this time unless with activity or repositioning. Reported no need to void at this time. No further needs or concerns reported by patient.

## 2023-09-15 NOTE — Progress Notes (Signed)
 Physical Therapy Treatment Patient Details Name: Teresa Patterson MRN: [id number] DOB: 1941-02-21 Today's Date: 09/15/2023   History of Present Illness Pt is an 83 y/o F admitted on 09/12/23 after presenting with c/o fall after playing in the yard with her grandson. Pt found to have periprosthetic intertrochanteric fx of R femur. Ortho was consulted & recommended non-operative management. PMH: hypothyroidism, R hip f/x, migraine, osteoporosis    PT Comments  Pt progressing towards physical therapy goals. Was able to perform transfers, ambulation and stair training with gross CGA to min assist and RW for support. Pt utilized shower chair for stair negotiation, and although is more of a cumbersome process, it will allow pt to maintain her precautions while advancing up the stairs. Pt wanting her grandsons to pick her up and carry her in the house, however educated on safety and pain management. Pt reports that she feels she needs another day before d/c. Updated MD after session. Will continue to follow.    If plan is discharge home, recommend the following: A little help with walking and/or transfers;A little help with bathing/dressing/bathroom;Assistance with cooking/housework;Assist for transportation;Help with stairs or ramp for entrance   Can travel by private vehicle        Equipment Recommendations  Rolling walker (2 wheels)    Recommendations for Other Services       Precautions / Restrictions Precautions Precautions: Fall Recall of Precautions/Restrictions: Intact Precaution/Restrictions Comments: Pt able to recall positional restrictions Restrictions Weight Bearing Restrictions Per Provider Order: Yes RLE Weight Bearing Per Provider Order: Touchdown weight bearing (touchdown weightbearing to up to 50% for balance only) Other Position/Activity Restrictions: No active R hip abduction, no flexion past 90 degrees     Mobility  Bed Mobility               General bed mobility  comments: Pt received sitting up in recliner    Transfers Overall transfer level: Needs assistance Equipment used: Rolling walker (2 wheels) Transfers: Sit to/from Stand Sit to Stand: Contact guard assist   Step pivot transfers: Contact guard assist       General transfer comment: VC's for hand placement on seated surface for safety.    Ambulation/Gait Ambulation/Gait assistance: Contact guard assist Gait Distance (Feet): 50 Feet Assistive device: Rolling walker (2 wheels) Gait Pattern/deviations: Step-to pattern, Decreased stride length, Decreased dorsiflexion - right, Decreased step length - right, Decreased step length - left, Step-through pattern, Trunk flexed Gait velocity: decreased Gait velocity interpretation: <1.8 ft/sec, indicate of risk for recurrent falls   General Gait Details: Slow and guarded due to pain. Pt was able to progress distance however required seated rest breaks.   Stairs Stairs: Yes Stairs assistance: Contact guard assist, Min assist Stair Management: One rail Right, Step to pattern, Sideways (With shower seat) Number of Stairs: 2 General stair comments: Pt unable to off weight RLE enough with UE's to advance LLE up first to ascend the stairs. Discussed using a w/c to bump up the stairs, and also using a shower chair to essentially perform a sit to stand and move the chair up each step. Practiced this method and pt was able to complete with assist. Video sent to pt's daughter at pt's request so she could see how exactly to utilize the shower chair.   Wheelchair Mobility     Tilt Bed    Modified Rankin (Stroke Patients Only)       Balance Overall balance assessment: Needs assistance Sitting-balance support: Single extremity supported, Bilateral upper  extremity supported, No upper extremity supported, Feet supported Sitting balance-Leahy Scale: Good     Standing balance support: Bilateral upper extremity supported, During functional  activity, Reliant on assistive device for balance Standing balance-Leahy Scale: Fair                              Hotel manager: No apparent difficulties  Cognition Arousal: Alert Behavior During Therapy: WFL for tasks assessed/performed   PT - Cognitive impairments: No apparent impairments                         Following commands: Intact      Cueing Cueing Techniques: Verbal cues, Gestural cues, Visual cues  Exercises      General Comments        Pertinent Vitals/Pain Pain Assessment Pain Assessment: Faces Faces Pain Scale: Hurts even more Pain Location: RLE Pain Descriptors / Indicators: Grimacing, Guarding, Stabbing, Sharp Pain Intervention(s): Limited activity within patient's tolerance, Monitored during session, Repositioned    Home Living Family/patient expects to be discharged to:: Private residence Living Arrangements: Alone Available Help at Discharge: Family;Available PRN/intermittently Type of Home: House Home Access: Stairs to enter Entrance Stairs-Rails: Right;Left Entrance Stairs-Number of Steps: 2   Home Layout: Two level;Able to live on main level with bedroom/bathroom Home Equipment: Shower seat;Grab bars - tub/shower;Cane - single point;BSC/3in1;Toilet riser;Standard Walker;Hand held shower head      Prior Function            PT Goals (current goals can now be found in the care plan section) Acute Rehab PT Goals Patient Stated Goal: get better, go home PT Goal Formulation: With patient Time For Goal Achievement: 09/28/23 Potential to Achieve Goals: Good Progress towards PT goals: Progressing toward goals    Frequency    Min 3X/week      PT Plan      Co-evaluation              AM-PAC PT "6 Clicks" Mobility   Outcome Measure  Help needed turning from your back to your side while in a flat bed without using bedrails?: None Help needed moving from lying on your back to  sitting on the side of a flat bed without using bedrails?: A Little Help needed moving to and from a bed to a chair (including a wheelchair)?: A Little Help needed standing up from a chair using your arms (e.g., wheelchair or bedside chair)?: A Little Help needed to walk in hospital room?: A Little Help needed climbing 3-5 steps with a railing? : A Little 6 Click Score: 19    End of Session Equipment Utilized During Treatment: Gait belt Activity Tolerance: Patient tolerated treatment well Patient left: in chair;with call bell/phone within reach Nurse Communication: Mobility status;Precautions PT Visit Diagnosis: Pain;Other abnormalities of gait and mobility (R26.89);Difficulty in walking, not elsewhere classified (R26.2);Muscle weakness (generalized) (M62.81) Pain - Right/Left: Right Pain - part of body: Leg;Hip     Time: 4098-1191 PT Time Calculation (min) (ACUTE ONLY): 47 min  Charges:    $Gait Training: 38-52 mins PT General Charges $$ ACUTE PT VISIT: 1 Visit                     Simone Dubois, PT, DPT Acute Rehabilitation Services Secure Chat Preferred Office: [phone]    Venus Ginsberg 09/15/2023, 3:36 PM

## 2023-09-17 ENCOUNTER — Telehealth: Payer: Self-pay | Admitting: Orthopaedic Surgery

## 2023-09-17 NOTE — Telephone Encounter (Signed)
 Sent message to [facility], advising them she is reaching out

## 2023-09-17 NOTE — Telephone Encounter (Signed)
 Patient called. She says she has not heard anything from an agency for her home PT. Would like a call letting her know who will be calling her. [phone]

## 2023-09-19 ENCOUNTER — Telehealth: Payer: Self-pay | Admitting: Orthopaedic Surgery

## 2023-09-19 ENCOUNTER — Other Ambulatory Visit: Payer: Self-pay | Admitting: Orthopaedic Surgery

## 2023-09-19 MED ORDER — OXYCODONE-ACETAMINOPHEN 5-325 MG PO TABS
1.0000 | ORAL_TABLET | Freq: Four times a day (QID) | ORAL | 0 refills | Status: DC | PRN
Start: 2023-09-19 — End: 2023-10-01

## 2023-09-19 NOTE — Telephone Encounter (Signed)
 Rx refill Oxycodone     Psychologist, forensic on [address]

## 2023-09-20 ENCOUNTER — Telehealth: Payer: Self-pay

## 2023-09-20 DIAGNOSIS — K5900 Constipation, unspecified: Secondary | ICD-10-CM | POA: Diagnosis not present

## 2023-09-20 DIAGNOSIS — Z860100 Personal history of colon polyps, unspecified: Secondary | ICD-10-CM | POA: Diagnosis not present

## 2023-09-20 DIAGNOSIS — G43709 Chronic migraine without aura, not intractable, without status migrainosus: Secondary | ICD-10-CM | POA: Diagnosis not present

## 2023-09-20 DIAGNOSIS — E039 Hypothyroidism, unspecified: Secondary | ICD-10-CM | POA: Diagnosis not present

## 2023-09-20 DIAGNOSIS — I10 Essential (primary) hypertension: Secondary | ICD-10-CM | POA: Diagnosis not present

## 2023-09-20 DIAGNOSIS — D649 Anemia, unspecified: Secondary | ICD-10-CM | POA: Diagnosis not present

## 2023-09-20 DIAGNOSIS — M80051D Age-related osteoporosis with current pathological fracture, right femur, subsequent encounter for fracture with routine healing: Secondary | ICD-10-CM | POA: Diagnosis not present

## 2023-09-20 DIAGNOSIS — J302 Other seasonal allergic rhinitis: Secondary | ICD-10-CM | POA: Diagnosis not present

## 2023-09-20 DIAGNOSIS — Z9181 History of falling: Secondary | ICD-10-CM | POA: Diagnosis not present

## 2023-09-20 DIAGNOSIS — Z7982 Long term (current) use of aspirin: Secondary | ICD-10-CM | POA: Diagnosis not present

## 2023-09-20 NOTE — Telephone Encounter (Signed)
 Copied from CRM [id number]. Topic: Clinical - Home Health Verbal Orders >> Sep 20, 2023  3:28 PM Dewanda Foots wrote: Caller/Agency: Judeen Nose from [facility] Callback Number: [phone] can leave vm with verbals if needed Service Requested: Physical Therapy Frequency: 1w1 2w2 3w5 Any new concerns about the patient? No

## 2023-09-21 NOTE — Telephone Encounter (Signed)
 LM forKathyrn

## 2023-09-21 NOTE — Telephone Encounter (Signed)
 approved

## 2023-09-25 DIAGNOSIS — G43709 Chronic migraine without aura, not intractable, without status migrainosus: Secondary | ICD-10-CM | POA: Diagnosis not present

## 2023-09-25 DIAGNOSIS — M80051D Age-related osteoporosis with current pathological fracture, right femur, subsequent encounter for fracture with routine healing: Secondary | ICD-10-CM | POA: Diagnosis not present

## 2023-09-25 DIAGNOSIS — K5900 Constipation, unspecified: Secondary | ICD-10-CM | POA: Diagnosis not present

## 2023-09-25 DIAGNOSIS — Z9181 History of falling: Secondary | ICD-10-CM | POA: Diagnosis not present

## 2023-09-25 DIAGNOSIS — D649 Anemia, unspecified: Secondary | ICD-10-CM | POA: Diagnosis not present

## 2023-09-25 DIAGNOSIS — Z7982 Long term (current) use of aspirin: Secondary | ICD-10-CM | POA: Diagnosis not present

## 2023-09-25 DIAGNOSIS — I10 Essential (primary) hypertension: Secondary | ICD-10-CM | POA: Diagnosis not present

## 2023-09-25 DIAGNOSIS — J302 Other seasonal allergic rhinitis: Secondary | ICD-10-CM | POA: Diagnosis not present

## 2023-09-25 DIAGNOSIS — E039 Hypothyroidism, unspecified: Secondary | ICD-10-CM | POA: Diagnosis not present

## 2023-09-25 DIAGNOSIS — Z860100 Personal history of colon polyps, unspecified: Secondary | ICD-10-CM | POA: Diagnosis not present

## 2023-09-27 DIAGNOSIS — G43709 Chronic migraine without aura, not intractable, without status migrainosus: Secondary | ICD-10-CM | POA: Diagnosis not present

## 2023-09-27 DIAGNOSIS — E039 Hypothyroidism, unspecified: Secondary | ICD-10-CM | POA: Diagnosis not present

## 2023-09-27 DIAGNOSIS — M80051D Age-related osteoporosis with current pathological fracture, right femur, subsequent encounter for fracture with routine healing: Secondary | ICD-10-CM | POA: Diagnosis not present

## 2023-09-27 DIAGNOSIS — D649 Anemia, unspecified: Secondary | ICD-10-CM | POA: Diagnosis not present

## 2023-09-27 DIAGNOSIS — K5900 Constipation, unspecified: Secondary | ICD-10-CM | POA: Diagnosis not present

## 2023-09-27 DIAGNOSIS — Z9181 History of falling: Secondary | ICD-10-CM | POA: Diagnosis not present

## 2023-09-27 DIAGNOSIS — Z860100 Personal history of colon polyps, unspecified: Secondary | ICD-10-CM | POA: Diagnosis not present

## 2023-09-27 DIAGNOSIS — I10 Essential (primary) hypertension: Secondary | ICD-10-CM | POA: Diagnosis not present

## 2023-09-27 DIAGNOSIS — J302 Other seasonal allergic rhinitis: Secondary | ICD-10-CM | POA: Diagnosis not present

## 2023-09-27 DIAGNOSIS — Z7982 Long term (current) use of aspirin: Secondary | ICD-10-CM | POA: Diagnosis not present

## 2023-10-01 ENCOUNTER — Other Ambulatory Visit (INDEPENDENT_AMBULATORY_CARE_PROVIDER_SITE_OTHER): Payer: Self-pay

## 2023-10-01 ENCOUNTER — Ambulatory Visit: Admitting: Physician Assistant

## 2023-10-01 ENCOUNTER — Encounter: Payer: Self-pay | Admitting: Physician Assistant

## 2023-10-01 DIAGNOSIS — Z96641 Presence of right artificial hip joint: Secondary | ICD-10-CM

## 2023-10-01 DIAGNOSIS — S72001D Fracture of unspecified part of neck of right femur, subsequent encounter for closed fracture with routine healing: Secondary | ICD-10-CM

## 2023-10-01 NOTE — Progress Notes (Signed)
 HPI: Teresa Patterson returns today status post right hip periprosthetic fracture.  She had a fall onto her hip while playing basketball with her grandson on 09/12/2023 where she was found to have a nondisplaced greater trochanteric fracture.  She has been 50% weightbearing with a walker.  She feels overall she is improving.  She is taking Tylenol  for pain.  Review of systems: See HPI otherwise negative  Physical exam: General: Well-developed well-nourished female no acute distress mood affect appropriate Right lower extremity good range of motion ankle and toes.  No range of motion of the hip performed today.  Radiographs: 2 views right hip: Hips well located.  Greater trochanteric fracture on AP view appears unchanged.  Slight step-off on the lateral view.  No change in overall position of the femoral component.  No other fractures or complicating features.  Impression: Right hip periprosthetic fracture  Plan: Will keep her 50% weightbearing on the right side.  She is encouraged with her toes and ankle to move her ankle.  She also ask about travel to [facility] this to be a flight to [place].  She did use going to visit family in would not be doing much activity other sitting around.  Therefore advised her that she should take aspirin  81 mg twice daily starting now before the flight on Saturday and continue until she returns home next Tuesday.  Also advised her to wear compression stockings.  Reminder no abduction exercises.  Will see her back in 3 weeks sooner if there is any questions concerns.  AP and lateral view of the right hip at that time.  She did ask about osteoporosis medications.  Therefore I will speak to Norma Beckers Persons PA-C about getting her into her clinic.  Questions were encouraged and answered both the patient and her daughter who was present throughout the exam today.

## 2023-10-02 DIAGNOSIS — Z860100 Personal history of colon polyps, unspecified: Secondary | ICD-10-CM | POA: Diagnosis not present

## 2023-10-02 DIAGNOSIS — M80051D Age-related osteoporosis with current pathological fracture, right femur, subsequent encounter for fracture with routine healing: Secondary | ICD-10-CM | POA: Diagnosis not present

## 2023-10-02 DIAGNOSIS — D649 Anemia, unspecified: Secondary | ICD-10-CM | POA: Diagnosis not present

## 2023-10-02 DIAGNOSIS — E039 Hypothyroidism, unspecified: Secondary | ICD-10-CM | POA: Diagnosis not present

## 2023-10-02 DIAGNOSIS — K5900 Constipation, unspecified: Secondary | ICD-10-CM | POA: Diagnosis not present

## 2023-10-02 DIAGNOSIS — I10 Essential (primary) hypertension: Secondary | ICD-10-CM | POA: Diagnosis not present

## 2023-10-02 DIAGNOSIS — J302 Other seasonal allergic rhinitis: Secondary | ICD-10-CM | POA: Diagnosis not present

## 2023-10-02 DIAGNOSIS — Z9181 History of falling: Secondary | ICD-10-CM | POA: Diagnosis not present

## 2023-10-02 DIAGNOSIS — Z7982 Long term (current) use of aspirin: Secondary | ICD-10-CM | POA: Diagnosis not present

## 2023-10-02 DIAGNOSIS — G43709 Chronic migraine without aura, not intractable, without status migrainosus: Secondary | ICD-10-CM | POA: Diagnosis not present

## 2023-10-03 ENCOUNTER — Other Ambulatory Visit: Payer: Self-pay | Admitting: Radiology

## 2023-10-03 DIAGNOSIS — M25551 Pain in right hip: Secondary | ICD-10-CM

## 2023-10-04 DIAGNOSIS — J302 Other seasonal allergic rhinitis: Secondary | ICD-10-CM | POA: Diagnosis not present

## 2023-10-04 DIAGNOSIS — M80051D Age-related osteoporosis with current pathological fracture, right femur, subsequent encounter for fracture with routine healing: Secondary | ICD-10-CM | POA: Diagnosis not present

## 2023-10-04 DIAGNOSIS — G43709 Chronic migraine without aura, not intractable, without status migrainosus: Secondary | ICD-10-CM | POA: Diagnosis not present

## 2023-10-04 DIAGNOSIS — D649 Anemia, unspecified: Secondary | ICD-10-CM | POA: Diagnosis not present

## 2023-10-04 DIAGNOSIS — E039 Hypothyroidism, unspecified: Secondary | ICD-10-CM | POA: Diagnosis not present

## 2023-10-04 DIAGNOSIS — Z7982 Long term (current) use of aspirin: Secondary | ICD-10-CM | POA: Diagnosis not present

## 2023-10-04 DIAGNOSIS — I10 Essential (primary) hypertension: Secondary | ICD-10-CM | POA: Diagnosis not present

## 2023-10-04 DIAGNOSIS — Z9181 History of falling: Secondary | ICD-10-CM | POA: Diagnosis not present

## 2023-10-04 DIAGNOSIS — K5900 Constipation, unspecified: Secondary | ICD-10-CM | POA: Diagnosis not present

## 2023-10-04 DIAGNOSIS — Z860100 Personal history of colon polyps, unspecified: Secondary | ICD-10-CM | POA: Diagnosis not present

## 2023-10-09 DIAGNOSIS — Z7982 Long term (current) use of aspirin: Secondary | ICD-10-CM | POA: Diagnosis not present

## 2023-10-09 DIAGNOSIS — I10 Essential (primary) hypertension: Secondary | ICD-10-CM | POA: Diagnosis not present

## 2023-10-09 DIAGNOSIS — K5900 Constipation, unspecified: Secondary | ICD-10-CM | POA: Diagnosis not present

## 2023-10-09 DIAGNOSIS — D649 Anemia, unspecified: Secondary | ICD-10-CM | POA: Diagnosis not present

## 2023-10-09 DIAGNOSIS — G43709 Chronic migraine without aura, not intractable, without status migrainosus: Secondary | ICD-10-CM | POA: Diagnosis not present

## 2023-10-09 DIAGNOSIS — J302 Other seasonal allergic rhinitis: Secondary | ICD-10-CM | POA: Diagnosis not present

## 2023-10-09 DIAGNOSIS — Z860100 Personal history of colon polyps, unspecified: Secondary | ICD-10-CM | POA: Diagnosis not present

## 2023-10-09 DIAGNOSIS — E039 Hypothyroidism, unspecified: Secondary | ICD-10-CM | POA: Diagnosis not present

## 2023-10-09 DIAGNOSIS — M80051D Age-related osteoporosis with current pathological fracture, right femur, subsequent encounter for fracture with routine healing: Secondary | ICD-10-CM | POA: Diagnosis not present

## 2023-10-09 DIAGNOSIS — Z9181 History of falling: Secondary | ICD-10-CM | POA: Diagnosis not present

## 2023-10-10 ENCOUNTER — Telehealth: Payer: Self-pay

## 2023-10-10 NOTE — Telephone Encounter (Signed)
 Please use dot phrase when receiving home health orders.   Patient was not seen by this provider for this condition.  She was evaluated on 10/01/2023 by orthopedics.  Orthopedics would have placed these orders.  Orthopedics needs to sign the orders.

## 2023-10-10 NOTE — Telephone Encounter (Signed)
 document Home Health Certificate (Order ID [id number]), to be filled out by provider. Patient requested to send it back via Fax within 7-days. Document is located in providers tray at front office.

## 2023-10-11 DIAGNOSIS — J302 Other seasonal allergic rhinitis: Secondary | ICD-10-CM | POA: Diagnosis not present

## 2023-10-11 DIAGNOSIS — Z9181 History of falling: Secondary | ICD-10-CM | POA: Diagnosis not present

## 2023-10-11 DIAGNOSIS — I10 Essential (primary) hypertension: Secondary | ICD-10-CM | POA: Diagnosis not present

## 2023-10-11 DIAGNOSIS — G43709 Chronic migraine without aura, not intractable, without status migrainosus: Secondary | ICD-10-CM | POA: Diagnosis not present

## 2023-10-11 DIAGNOSIS — Z860100 Personal history of colon polyps, unspecified: Secondary | ICD-10-CM | POA: Diagnosis not present

## 2023-10-11 DIAGNOSIS — K5900 Constipation, unspecified: Secondary | ICD-10-CM | POA: Diagnosis not present

## 2023-10-11 DIAGNOSIS — Z7982 Long term (current) use of aspirin: Secondary | ICD-10-CM | POA: Diagnosis not present

## 2023-10-11 DIAGNOSIS — E039 Hypothyroidism, unspecified: Secondary | ICD-10-CM | POA: Diagnosis not present

## 2023-10-11 DIAGNOSIS — D649 Anemia, unspecified: Secondary | ICD-10-CM | POA: Diagnosis not present

## 2023-10-11 DIAGNOSIS — M80051D Age-related osteoporosis with current pathological fracture, right femur, subsequent encounter for fracture with routine healing: Secondary | ICD-10-CM | POA: Diagnosis not present

## 2023-10-11 NOTE — Telephone Encounter (Signed)
 Forms faxed to Ortho.

## 2023-10-18 ENCOUNTER — Telehealth: Payer: Self-pay

## 2023-10-18 DIAGNOSIS — E039 Hypothyroidism, unspecified: Secondary | ICD-10-CM | POA: Diagnosis not present

## 2023-10-18 DIAGNOSIS — J302 Other seasonal allergic rhinitis: Secondary | ICD-10-CM | POA: Diagnosis not present

## 2023-10-18 DIAGNOSIS — M80051D Age-related osteoporosis with current pathological fracture, right femur, subsequent encounter for fracture with routine healing: Secondary | ICD-10-CM | POA: Diagnosis not present

## 2023-10-18 DIAGNOSIS — D649 Anemia, unspecified: Secondary | ICD-10-CM | POA: Diagnosis not present

## 2023-10-18 DIAGNOSIS — G43709 Chronic migraine without aura, not intractable, without status migrainosus: Secondary | ICD-10-CM | POA: Diagnosis not present

## 2023-10-18 DIAGNOSIS — Z7982 Long term (current) use of aspirin: Secondary | ICD-10-CM | POA: Diagnosis not present

## 2023-10-18 DIAGNOSIS — I10 Essential (primary) hypertension: Secondary | ICD-10-CM | POA: Diagnosis not present

## 2023-10-18 DIAGNOSIS — Z860100 Personal history of colon polyps, unspecified: Secondary | ICD-10-CM | POA: Diagnosis not present

## 2023-10-18 DIAGNOSIS — K5900 Constipation, unspecified: Secondary | ICD-10-CM | POA: Diagnosis not present

## 2023-10-18 DIAGNOSIS — Z9181 History of falling: Secondary | ICD-10-CM | POA: Diagnosis not present

## 2023-10-18 NOTE — Telephone Encounter (Signed)
 document Home Health Certificate (Order ID [id number]), to be filled out by provider. Patient requested to send it back via Fax within 7-days. Document is located in providers tray at front office.

## 2023-10-19 NOTE — Telephone Encounter (Signed)
Faxed to ortho

## 2023-10-19 NOTE — Telephone Encounter (Signed)
 Patient has never been seen by this provider for this condition. Orders need to go to her orthopedic team for signing.

## 2023-10-30 DIAGNOSIS — J302 Other seasonal allergic rhinitis: Secondary | ICD-10-CM | POA: Diagnosis not present

## 2023-10-30 DIAGNOSIS — D649 Anemia, unspecified: Secondary | ICD-10-CM | POA: Diagnosis not present

## 2023-10-30 DIAGNOSIS — Z9181 History of falling: Secondary | ICD-10-CM | POA: Diagnosis not present

## 2023-10-30 DIAGNOSIS — Z860100 Personal history of colon polyps, unspecified: Secondary | ICD-10-CM | POA: Diagnosis not present

## 2023-10-30 DIAGNOSIS — M80051D Age-related osteoporosis with current pathological fracture, right femur, subsequent encounter for fracture with routine healing: Secondary | ICD-10-CM | POA: Diagnosis not present

## 2023-10-30 DIAGNOSIS — Z7982 Long term (current) use of aspirin: Secondary | ICD-10-CM | POA: Diagnosis not present

## 2023-10-30 DIAGNOSIS — K5900 Constipation, unspecified: Secondary | ICD-10-CM | POA: Diagnosis not present

## 2023-10-30 DIAGNOSIS — I10 Essential (primary) hypertension: Secondary | ICD-10-CM | POA: Diagnosis not present

## 2023-10-30 DIAGNOSIS — E039 Hypothyroidism, unspecified: Secondary | ICD-10-CM | POA: Diagnosis not present

## 2023-10-30 DIAGNOSIS — G43709 Chronic migraine without aura, not intractable, without status migrainosus: Secondary | ICD-10-CM | POA: Diagnosis not present

## 2023-11-01 ENCOUNTER — Other Ambulatory Visit (INDEPENDENT_AMBULATORY_CARE_PROVIDER_SITE_OTHER): Payer: Self-pay

## 2023-11-01 ENCOUNTER — Encounter: Payer: Self-pay | Admitting: Orthopaedic Surgery

## 2023-11-01 ENCOUNTER — Ambulatory Visit: Admitting: Orthopaedic Surgery

## 2023-11-01 DIAGNOSIS — Z96641 Presence of right artificial hip joint: Secondary | ICD-10-CM

## 2023-11-01 DIAGNOSIS — S72001D Fracture of unspecified part of neck of right femur, subsequent encounter for closed fracture with routine healing: Secondary | ICD-10-CM | POA: Diagnosis not present

## 2023-11-01 NOTE — Progress Notes (Signed)
 The patient is an active 83 year old female who is now 7 weeks out from a mechanical fall while she was shooting basketball with the grandchild.  She sustained a minimally displaced fracture of the right hip greater trochanter.  We are originally had her touchdown weightbearing and then progressed her to 50% weightbearing with a walker.  It looks like she is still walking with a walker but putting more weight on the hip.  She says that she is doing well and has no pain at all.  On examination I can put her right hip through internal and external rotation and she is pain-free.  Palpating around the trochanteric area causes only minimal discomfort.  2 views of the right hip show the fracture is healing significantly when compared to previous films and the implant itself from her total hip arthroplasty is intact.  She can weight-bear as tolerated and use a cane in her opposite hand.  Would like her to use a cane for at least the next 2 weeks as she is getting stronger.  We will see her back for a final visit in a month with a repeat 2 views of her right hip.

## 2023-11-06 DIAGNOSIS — G43709 Chronic migraine without aura, not intractable, without status migrainosus: Secondary | ICD-10-CM | POA: Diagnosis not present

## 2023-11-06 DIAGNOSIS — Z9181 History of falling: Secondary | ICD-10-CM | POA: Diagnosis not present

## 2023-11-06 DIAGNOSIS — J302 Other seasonal allergic rhinitis: Secondary | ICD-10-CM | POA: Diagnosis not present

## 2023-11-06 DIAGNOSIS — E039 Hypothyroidism, unspecified: Secondary | ICD-10-CM | POA: Diagnosis not present

## 2023-11-06 DIAGNOSIS — I10 Essential (primary) hypertension: Secondary | ICD-10-CM | POA: Diagnosis not present

## 2023-11-06 DIAGNOSIS — Z860100 Personal history of colon polyps, unspecified: Secondary | ICD-10-CM | POA: Diagnosis not present

## 2023-11-06 DIAGNOSIS — D649 Anemia, unspecified: Secondary | ICD-10-CM | POA: Diagnosis not present

## 2023-11-06 DIAGNOSIS — Z7982 Long term (current) use of aspirin: Secondary | ICD-10-CM | POA: Diagnosis not present

## 2023-11-06 DIAGNOSIS — K5900 Constipation, unspecified: Secondary | ICD-10-CM | POA: Diagnosis not present

## 2023-11-06 DIAGNOSIS — M80051D Age-related osteoporosis with current pathological fracture, right femur, subsequent encounter for fracture with routine healing: Secondary | ICD-10-CM | POA: Diagnosis not present

## 2023-11-07 ENCOUNTER — Encounter: Payer: Self-pay | Admitting: Physician Assistant

## 2023-11-07 ENCOUNTER — Ambulatory Visit: Admitting: Physician Assistant

## 2023-11-07 DIAGNOSIS — M810 Age-related osteoporosis without current pathological fracture: Secondary | ICD-10-CM

## 2023-11-07 NOTE — Progress Notes (Unsigned)
   Office Visit Note   Patient: Teresa Patterson           Date of Birth: 06-30-1940           MRN: [id number] Visit Date: 11/07/2023              Requested by: Bronson Canny, PA-C [id number]  [address] [place],  [place] [address] PCP: Mariel Shope, DO   Assessment & Plan: Visit Diagnoses: No diagnosis found.  Plan: ***  Follow-Up Instructions: No follow-ups on file.   Orders:  No orders of the defined types were placed in this encounter.  No orders of the defined types were placed in this encounter.     Procedures: No procedures performed   Clinical Data: No additional findings.   Subjective: Chief Complaint  Patient presents with  . Osteoporosis    HPI  Review of Systems  All other systems reviewed and are negative.   Objective: Vital Signs: There were no vitals taken for this visit.  Physical Exam Constitutional:      Appearance: Normal appearance.  Pulmonary:     Effort: Pulmonary effort is normal.   Skin:    General: Skin is warm and dry.   Neurological:     General: No focal deficit present.     Mental Status: She is alert and oriented to person, place, and time.   Ortho Exam  Specialty Comments:  No specialty comments available.  Imaging: No results found.   PMFS History: Patient Active Problem List   Diagnosis Date Noted  . Anemia 09/13/2023  . Cardiomegaly 09/13/2023  . Nondisplaced fracture of greater trochanter of right femur, initial encounter for closed fracture (HCC) 09/13/2023  . Closed intertrochanteric fracture of right femur (HCC) 10/23/2022  . Migraine, chronic, without aura 09/05/2021  . Osteopenia 11/06/2019  . Edema 07/25/2018  . Seasonal allergies 07/25/2018  . Hypothyroidism 07/11/2016  . Prominent aorta 07/11/2016  . Primary osteoarthritis of first carpometacarpal joint of right hand 11/25/2015  . Menopausal symptom 06/04/2007   Past Medical History:  Diagnosis Date  . Colon polyp   . Hypothyroid     hypothyroid  . Migraine   . Multiple dysplastic nevi    established with Dr. Marcia Setters dermatology  . Osteopenia     Family History  Problem Relation Age of Onset  . Brain cancer Brother        BRAIN TUMOR  . Cancer Maternal Uncle        ?    Past Surgical History:  Procedure Laterality Date  . ABDOMINAL HYSTERECTOMY  1972  . CHOLECYSTECTOMY  1995  . OVARIAN CYST REMOVAL Right    right  . TONSILLECTOMY AND ADENOIDECTOMY  1952  . TOTAL HIP ARTHROPLASTY Right 10/24/2022   Procedure: TOTAL HIP ARTHROPLASTY ANTERIOR APPROACH;  Surgeon: Arnie Lao, MD;  Location: [facility] OR;  Service: Orthopedics;  Laterality: Right;   Social History   Occupational History  . Not on file  Tobacco Use  . Smoking status: Never  . Smokeless tobacco: Never  Vaping Use  . Vaping status: Never Used  Substance and Sexual Activity  . Alcohol use: No    Alcohol/week: 0.0 standard drinks of alcohol  . Drug use: No  . Sexual activity: Not Currently    Partners: Male

## 2023-11-13 DIAGNOSIS — Z7982 Long term (current) use of aspirin: Secondary | ICD-10-CM | POA: Diagnosis not present

## 2023-11-13 DIAGNOSIS — G43709 Chronic migraine without aura, not intractable, without status migrainosus: Secondary | ICD-10-CM | POA: Diagnosis not present

## 2023-11-13 DIAGNOSIS — M80051D Age-related osteoporosis with current pathological fracture, right femur, subsequent encounter for fracture with routine healing: Secondary | ICD-10-CM | POA: Diagnosis not present

## 2023-11-13 DIAGNOSIS — D649 Anemia, unspecified: Secondary | ICD-10-CM | POA: Diagnosis not present

## 2023-11-13 DIAGNOSIS — J302 Other seasonal allergic rhinitis: Secondary | ICD-10-CM | POA: Diagnosis not present

## 2023-11-13 DIAGNOSIS — K5900 Constipation, unspecified: Secondary | ICD-10-CM | POA: Diagnosis not present

## 2023-11-13 DIAGNOSIS — Z860100 Personal history of colon polyps, unspecified: Secondary | ICD-10-CM | POA: Diagnosis not present

## 2023-11-13 DIAGNOSIS — Z9181 History of falling: Secondary | ICD-10-CM | POA: Diagnosis not present

## 2023-11-13 DIAGNOSIS — I10 Essential (primary) hypertension: Secondary | ICD-10-CM | POA: Diagnosis not present

## 2023-11-13 DIAGNOSIS — E039 Hypothyroidism, unspecified: Secondary | ICD-10-CM | POA: Diagnosis not present

## 2023-11-30 ENCOUNTER — Telehealth: Payer: Self-pay | Admitting: Physician Assistant

## 2023-11-30 ENCOUNTER — Telehealth: Payer: Self-pay

## 2023-11-30 NOTE — Telephone Encounter (Signed)
 Home health orders received 11/30/23 for Teresa Patterson [facility] initiation orders: No.  Home health re-certification orders: Yes.  Patient last seen by ordering physician for this condition: 09/11/23. Must be less than 90 days for re-certification and less than 30 days prior for initiation. Visit must have been for the condition the orders are being placed.  Patient meets criteria for Physician to sign orders: Yes.        Orders placed on physicians desk for signature: 11/30/23 (date)  If patient does not meet criteria for orders to be signed: pt was called to schedule appt. Appt is scheduled for n/a.

## 2023-11-30 NOTE — Telephone Encounter (Signed)
 Pt called requesting a call back from PA Persons. Pt states she need to know next plan of care. Pt phone number is [phone].

## 2023-12-04 NOTE — Telephone Encounter (Signed)
Faxed to ortho

## 2023-12-04 NOTE — Telephone Encounter (Signed)
 These home health orders are from her orthopedic surgery and her orthopedic team needs to complete them. I have not evaluated the patient for this condition.

## 2023-12-05 ENCOUNTER — Encounter: Payer: Self-pay | Admitting: Orthopaedic Surgery

## 2023-12-05 ENCOUNTER — Ambulatory Visit (INDEPENDENT_AMBULATORY_CARE_PROVIDER_SITE_OTHER): Admitting: *Deleted

## 2023-12-05 ENCOUNTER — Ambulatory Visit (INDEPENDENT_AMBULATORY_CARE_PROVIDER_SITE_OTHER): Payer: Medicare HMO | Admitting: Orthopaedic Surgery

## 2023-12-05 ENCOUNTER — Other Ambulatory Visit (INDEPENDENT_AMBULATORY_CARE_PROVIDER_SITE_OTHER): Payer: Self-pay

## 2023-12-05 VITALS — Ht 64.0 in | Wt 125.0 lb

## 2023-12-05 DIAGNOSIS — Z0000 Encounter for general adult medical examination without abnormal findings: Secondary | ICD-10-CM | POA: Diagnosis not present

## 2023-12-05 DIAGNOSIS — Z96641 Presence of right artificial hip joint: Secondary | ICD-10-CM

## 2023-12-05 NOTE — Progress Notes (Signed)
 The patient is well-known to us .  She is 13 months out from a right total hip replacement that needed to be performed after an acute femoral neck fracture.  She was power washing an area when she fell and broke her right hip.  She was 82 at the time.  She is now 31.  In April of this year she was shooting baskets/hoops with family and she fell and she did sustain a fracture that was nondisplaced around the right hip trochanteric area.  We have had her go slow.  She is now walking without assistive device incision is doing well and has really no discomfort.  Her right hip moves smoothly and fluidly.  There is minimal pain to palpation of the trochanteric area of the right hip.  When I have her lay in the supine position her leg lengths are equal.   An AP pelvis and lateral right hip shows significant healing of the greater trochanteric fracture with no impact on the replacement itself.  The components are well-seated.  This point she will still go slow with her activities in terms of high impact aerobic activities but she can get back to other things as comfort allows.  She is ambulating without any assistive device and think that is fine given her high level function and how good she looks.  Follow-up is as needed.  If she does have any issues she knows to reach out and let us  know.

## 2023-12-05 NOTE — Progress Notes (Signed)
 Subjective:   Teresa Patterson is a 83 y.o. female who presents for Medicare Annual (Subsequent) preventive examination.  Visit Complete: Virtual I connected with  Teresa Patterson on 12/05/23 by a audio enabled telemedicine application and verified that I am speaking with the correct person using two identifiers.  Patient Location: Home  Provider Location: Home Office  I discussed the limitations of evaluation and management by telemedicine. The patient expressed understanding and agreed to proceed.  Vital Signs: Because this visit was a virtual/telehealth visit, some criteria may be missing or patient reported. Any vitals not documented were not able to be obtained and vitals that have been documented are patient reported. .  Cardiac Risk Factors include: advanced age (>7men, >40 women);hypertension     Objective:    Today's Vitals   12/05/23 0950  Weight: 125 lb (56.7 kg)  Height: 5' 4 (1.626 m)   Body mass index is 21.46 kg/m.     12/05/2023    9:49 AM 09/12/2023    7:29 PM 04/30/2023    2:29 PM 11/29/2022   10:00 AM 10/23/2022    6:14 PM 01/25/2022   10:20 AM 10/22/2013    9:35 AM  Advanced Directives  Does Patient Have a Medical Advance Directive? Yes Yes No Yes No Yes Patient has advance directive, copy not in chart   Type of Advance Directive Healthcare Power of [employer] [place];Living will  Healthcare Power of [place];Living will   Copy of Healthcare Power of Attorney in Chart? No - copy requested   No - copy requested     Would patient like information on creating a medical advance directive?   No - Patient declined  No - Patient declined       Data saved with a previous flowsheet row definition    Current Medications (verified) Outpatient Encounter Medications as of 12/05/2023  Medication Sig   aspirin  81 MG chewable tablet Chew 1 tablet (81 mg total) by mouth 2 (two) times daily.   Coenzyme Q10 (CO Q 10) 100 MG CAPS Take 1 capsule by mouth  every evening.   conjugated estrogens  (PREMARIN ) vaginal cream 1 applicator of cream  intravaginally 1-2 weekly qhs   cyanocobalamin  1000 MCG tablet Take 1 tablet (1,000 mcg total) by mouth daily.   fluticasone  (FLONASE ) 50 MCG/ACT nasal spray Place 2 sprays into both nostrils daily.   hydrochlorothiazide  (HYDRODIURIL ) 25 MG tablet Take 1 tablet (25 mg total) by mouth daily.   levothyroxine  (SYNTHROID ) 100 MCG tablet Take 1 tablet (100 mcg total) by mouth daily.   Multiple Vitamin (MULTIVITAMIN) tablet Take 1 tablet by mouth every evening.   oxybutynin  (DITROPAN -XL) 10 MG 24 hr tablet Take 10 mg by mouth daily.   SUMAtriptan  (IMITREX ) 5 MG/ACT nasal spray Place 1 spray (5 mg total) into the nose every 2 (two) hours as needed.   tiZANidine  (ZANAFLEX ) 2 MG tablet Take 1 tablet (2 mg total) by mouth every 6 (six) hours as needed for muscle spasms.   No facility-administered encounter medications on file as of 12/05/2023.    Allergies (verified) Patient has no known allergies.   History: Past Medical History:  Diagnosis Date   Colon polyp    Hypothyroid    hypothyroid   Migraine    Multiple dysplastic nevi    established with Dr. Rolan Molt dermatology   Osteopenia    Past Surgical History:  Procedure Laterality Date   ABDOMINAL HYSTERECTOMY  1972   CHOLECYSTECTOMY  1995   OVARIAN CYST REMOVAL Right    right   TONSILLECTOMY AND ADENOIDECTOMY  1952   TOTAL HIP ARTHROPLASTY Right 10/24/2022   Procedure: TOTAL HIP ARTHROPLASTY ANTERIOR APPROACH;  Surgeon: Vernetta Lonni GRADE, MD;  Location: [facility] OR;  Service: Orthopedics;  Laterality: Right;   Family History  Problem Relation Age of Onset   Brain cancer Brother        BRAIN TUMOR   Cancer Maternal Uncle        ?   Social History   Socioeconomic History   Marital status: Widowed    Spouse name: Not on file   Number of children: Not on file   Years of education: Not on file   Highest education level: Not on file   Occupational History   Not on file  Tobacco Use   Smoking status: Never   Smokeless tobacco: Never  Vaping Use   Vaping status: Never Used  Substance and Sexual Activity   Alcohol use: No    Alcohol/week: 0.0 standard drinks of alcohol   Drug use: No   Sexual activity: Not Currently    Partners: Male  Other Topics Concern   Not on file  Social History Narrative   Marital status/children/pets: Widowed. 3 children. Lives alone.    Education/employment: GED, retired.    Safety:      -smoke alarm in the home:Yes     - wears seatbelt: Yes     - Feels safe in their surroundings: Yes   Social Drivers of Health   Financial Resource Strain: Low Risk  (12/05/2023)   Overall Financial Resource Strain (CARDIA)    Difficulty of Paying Living Expenses: Not hard at all  Food Insecurity: No Food Insecurity (12/05/2023)   Hunger Vital Sign    Worried About Running Out of Food in the Last Year: Never true    Ran Out of Food in the Last Year: Never true  Transportation Needs: No Transportation Needs (12/05/2023)   PRAPARE - Administrator, Civil Service (Medical): No    Lack of Transportation (Non-Medical): No  Physical Activity: Insufficiently Active (12/05/2023)   Exercise Vital Sign    Days of Exercise per Week: 3 days    Minutes of Exercise per Session: 30 min  Stress: No Stress Concern Present (12/05/2023)   [employer] of Occupational Health - Occupational Stress Questionnaire    Feeling of Stress: Not at all  Social Connections: Moderately Isolated (12/05/2023)   Social Connection and Isolation Panel    Frequency of Communication with Friends and Family: More than three times a week    Frequency of Social Gatherings with Friends and Family: More than three times a week    Attends Religious Services: Never    Database administrator or Organizations: Yes    Attends Banker Meetings: 1 to 4 times per year    Marital Status: Widowed    Tobacco  Counseling Counseling given: Not Answered   Clinical Intake:  Pre-visit preparation completed: Yes  Pain : No/denies pain     Diabetes: No  How often do you need to have someone help you when you read instructions, pamphlets, or other written materials from your doctor or pharmacy?: 1 - Never  Interpreter Needed?: No  Information entered by :: Mliss Graff LPN   Activities of Daily Living    12/05/2023    9:50 AM 09/13/2023    6:29 AM  In your present state of health, do  you have any difficulty performing the following activities:  Hearing? 0 0  Vision? 0 0  Difficulty concentrating or making decisions? 0 0  Walking or climbing stairs? 0   Dressing or bathing? 0   Doing errands, shopping? 0   Preparing Food and eating ? N   Using the Toilet? N   In the past six months, have you accidently leaked urine? N   Do you have problems with loss of bowel control? N   Managing your Medications? N   Managing your Finances? N   Housekeeping or managing your Housekeeping? N     Patient Care Team: Catherine Charlies LABOR, DO as PCP - General (Family Medicine) Joshua Sieving, MD as Consulting Physician (Dermatology) Luis Purchase, MD as Consulting Physician (Gastroenterology) Camillo Golas, MD as Consulting Physician (Ophthalmology) Cleotilde Sewer, OD (Optometry) Sissy Cough, MD as Consulting Physician (Orthopedic Surgery)  Indicate any recent Medical Services you may have received from other than [facility] providers in the past year (date may be approximate).     Assessment:   This is a routine wellness examination for Joely.  Hearing/Vision screen Hearing Screening - Comments:: No trouble hearing Vision Screening - Comments:: Up to date [facility]   Goals Addressed             This Visit's Progress    Patient Stated   On track    Continue to stay healthy      Patient Stated       Stay Healthy       Depression Screen    12/05/2023    9:52 AM 11/29/2022    9:59 AM  09/07/2022    9:02 AM 01/25/2022   10:19 AM 09/05/2021    8:30 AM 07/29/2020    9:03 AM 07/29/2019    9:02 AM  PHQ 2/9 Scores  PHQ - 2 Score 0 0 0 0 0 0 0  PHQ- 9 Score 0          Fall Risk    12/05/2023    9:47 AM 11/29/2022   10:01 AM 09/07/2022    9:01 AM 01/25/2022   10:20 AM 09/05/2021    8:29 AM  Fall Risk   Falls in the past year? 1 1 0 0 0  Number falls in past yr: 0 1 0 0 0  Injury with Fall? 1 1 0 0 0  Comment  right hip injury     Risk for fall due to :  History of fall(s) No Fall Risks No Fall Risks No Fall Risks  Follow up Falls evaluation completed;Education provided;Falls prevention discussed Falls prevention discussed Falls evaluation completed Falls evaluation completed  Falls evaluation completed      Data saved with a previous flowsheet row definition    MEDICARE RISK AT HOME: Medicare Risk at Home Any stairs in or around the home?: Yes If so, are there any without handrails?: No Home free of loose throw rugs in walkways, pet beds, electrical cords, etc?: Yes Adequate lighting in your home to reduce risk of falls?: Yes Life alert?: No Use of a cane, walker or w/c?: No Grab bars in the bathroom?: Yes Shower chair or bench in shower?: Yes  TIMED UP AND GO:  Was the test performed?  No    Cognitive Function:        12/05/2023    9:48 AM 11/29/2022   10:02 AM 01/25/2022   10:21 AM  6CIT Screen  What Year? 0 points 0 points 0 points  What month? 0 points 0 points 0 points  What time? 0 points 0 points 0 points  Count back from 20 0 points 0 points 0 points  Months in reverse 0 points 0 points   Repeat phrase 0 points 0 points 0 points  Total Score 0 points 0 points     Immunizations Immunization History  Administered Date(s) Administered   Fluad Quad(high Dose 65+) 02/09/2022   H1N1 06/11/2008   Influenza Split 03/30/2011   Influenza Whole 06/04/2007, 02/14/2008, 06/16/2009   Influenza, High Dose Seasonal PF 02/08/2015, 01/30/2017, 02/15/2018,  02/24/2021, 02/03/2023   Influenza,inj,Quad PF,6+ Mos 02/14/2013, 01/08/2014   Influenza-Unspecified 02/24/2016, 02/15/2018, 02/20/2019, 03/03/2020   PFIZER(Purple Top)SARS-COV-2 Vaccination 06/26/2019, 07/22/2019, 02/19/2020, 02/24/2021   PNEUMOCOCCAL CONJUGATE-20 03/28/2023   Pfizer(Comirnaty)Fall Seasonal Vaccine 12 years and older 02/03/2023   Pneumococcal Conjugate-13 10/22/2013   Pneumococcal Polysaccharide-23 05/17/2006   Respiratory Syncytial Virus Vaccine,Recomb Aduvanted(Arexvy) 02/09/2022   Td 03/28/2005, 07/11/2016   Zoster Recombinant(Shingrix) 10/31/2019, 12/31/2019    TDAP status: Up to date  Flu Vaccine status: Up to date  Pneumococcal vaccine status: Up to date  Covid-19 vaccine status: Information provided on how to obtain vaccines.   Qualifies for Shingles Vaccine? No   Zostavax completed Yes   Shingrix Completed?: Yes  Screening Tests Health Maintenance  Topic Date Due   INFLUENZA VACCINE  12/21/2023   DEXA SCAN  02/24/2024   MAMMOGRAM  02/26/2024   Medicare Annual Wellness (AWV)  12/04/2024   DTaP/Tdap/Td (3 - Tdap) 07/11/2026   Pneumococcal Vaccine: 50+ Years  Completed   Zoster Vaccines- Shingrix  Completed   Hepatitis B Vaccines  Aged Out   HPV VACCINES  Aged Out   Meningococcal B Vaccine  Aged Out   COVID-19 Vaccine  Discontinued    Health Maintenance  There are no preventive care reminders to display for this patient.   Colorectal cancer screening: No longer required.   Mammogram  scheduled  Bone Density  scheduled  Lung Cancer Screening: (Low Dose CT Chest recommended if Age 89-80 years, 20 pack-year currently smoking OR have quit w/in 15years.) does not qualify.   Lung Cancer Screening Referral:   Additional Screening:  Hepatitis C Screening: does not qualify  Vision Screening: Recommended annual ophthalmology exams for early detection of glaucoma and other disorders of the eye. Is the patient up to date with their annual eye  exam?  Yes  Who is the provider or what is the name of the office in which the patient attends annual eye exams? [facility] If pt is not established with a provider, would they like to be referred to a provider to establish care? No .   Dental Screening: Recommended annual dental exams for proper oral hygiene    Community Resource Referral / Chronic Care Management: CRR required this visit?  No   CCM required this visit?  No     Plan:     I have personally reviewed and noted the following in the patient's chart:   Medical and social history Use of alcohol, tobacco or illicit drugs  Current medications and supplements including opioid prescriptions. Patient is not currently taking opioid prescriptions. Functional ability and status Nutritional status Physical activity Advanced directives List of other physicians Hospitalizations, surgeries, and ER visits in previous 12 months Vitals Screenings to include cognitive, depression, and falls Referrals and appointments  In addition, I have reviewed and discussed with patient certain preventive protocols, quality metrics, and best practice recommendations. A written personalized care plan for preventive  services as well as general preventive health recommendations were provided to patient.     Mliss Graff, LPN   [id number]   After Visit Summary: (MyChart) Due to this being a telephonic visit, the after visit summary with patients personalized plan was offered to patient via MyChart   Nurse Notes:

## 2023-12-05 NOTE — Patient Instructions (Signed)
 Teresa Patterson , Thank you for taking time to come for your Medicare Wellness Visit. I appreciate your ongoing commitment to your health goals. Please review the following plan we discussed and let me know if I can assist you in the future.   Screening recommendations/referrals: Colonoscopy: no longer required Mammogram: is scheduled Bone Density: is scheduled Recommended yearly ophthalmology/optometry visit for glaucoma screening and checkup Recommended yearly dental visit for hygiene and checkup  Vaccinations: Influenza vaccine: up to date Pneumococcal vaccine: up to date Tdap vaccine: up to date Shingles vaccine: up to date      Preventive Care 65 Years and Older, Female Preventive care refers to lifestyle choices and visits with your health care provider that can promote health and wellness. What does preventive care include? A yearly physical exam. This is also called an annual well check. Dental exams once or twice a year. Routine eye exams. Ask your health care provider how often you should have your eyes checked. Personal lifestyle choices, including: Daily care of your teeth and gums. Regular physical activity. Eating a healthy diet. Avoiding tobacco and drug use. Limiting alcohol use. Practicing safe sex. Taking low-dose aspirin  every day. Taking vitamin and mineral supplements as recommended by your health care provider. What happens during an annual well check? The services and screenings done by your health care provider during your annual well check will depend on your age, overall health, lifestyle risk factors, and family history of disease. Counseling  Your health care provider may ask you questions about your: Alcohol use. Tobacco use. Drug use. Emotional well-being. Home and relationship well-being. Sexual activity. Eating habits. History of falls. Memory and ability to understand (cognition). Work and work Astronomer. Reproductive health. Screening   You may have the following tests or measurements: Height, weight, and BMI. Blood pressure. Lipid and cholesterol levels. These may be checked every 5 years, or more frequently if you are over 42 years old. Skin check. Lung cancer screening. You may have this screening every year starting at age 64 if you have a 30-pack-year history of smoking and currently smoke or have quit within the past 15 years. Fecal occult blood test (FOBT) of the stool. You may have this test every year starting at age 8. Flexible sigmoidoscopy or colonoscopy. You may have a sigmoidoscopy every 5 years or a colonoscopy every 10 years starting at age 34. Hepatitis C blood test. Hepatitis B blood test. Sexually transmitted disease (STD) testing. Diabetes screening. This is done by checking your blood sugar (glucose) after you have not eaten for a while (fasting). You may have this done every 1-3 years. Bone density scan. This is done to screen for osteoporosis. You may have this done starting at age 34. Mammogram. This may be done every 1-2 years. Talk to your health care provider about how often you should have regular mammograms. Talk with your health care provider about your test results, treatment options, and if necessary, the need for more tests. Vaccines  Your health care provider may recommend certain vaccines, such as: Influenza vaccine. This is recommended every year. Tetanus, diphtheria, and acellular pertussis (Tdap, Td) vaccine. You may need a Td booster every 10 years. Zoster vaccine. You may need this after age 28. Pneumococcal 13-valent conjugate (PCV13) vaccine. One dose is recommended after age 61. Pneumococcal polysaccharide (PPSV23) vaccine. One dose is recommended after age 1. Talk to your health care provider about which screenings and vaccines you need and how often you need them. This information is  not intended to replace advice given to you by your health care provider. Make sure you discuss  any questions you have with your health care provider. Document Released: 06/04/2015 Document Revised: 01/26/2016 Document Reviewed: 03/09/2015 Elsevier Interactive Patient Education  2017 [employer].  Fall Prevention in the Home Falls can cause injuries. They can happen to people of all ages. There are many things you can do to make your home safe and to help prevent falls. What can I do on the outside of my home? Regularly fix the edges of walkways and driveways and fix any cracks. Remove anything that might make you trip as you walk through a door, such as a raised step or threshold. Trim any bushes or trees on the path to your home. Use bright outdoor lighting. Clear any walking paths of anything that might make someone trip, such as rocks or tools. Regularly check to see if handrails are loose or broken. Make sure that both sides of any steps have handrails. Any raised decks and porches should have guardrails on the edges. Have any leaves, snow, or ice cleared regularly. Use sand or salt on walking paths during winter. Clean up any spills in your garage right away. This includes oil or grease spills. What can I do in the bathroom? Use night lights. Install grab bars by the toilet and in the tub and shower. Do not use towel bars as grab bars. Use non-skid mats or decals in the tub or shower. If you need to sit down in the shower, use a plastic, non-slip stool. Keep the floor dry. Clean up any water that spills on the floor as soon as it happens. Remove soap buildup in the tub or shower regularly. Attach bath mats securely with double-sided non-slip rug tape. Do not have throw rugs and other things on the floor that can make you trip. What can I do in the bedroom? Use night lights. Make sure that you have a light by your bed that is easy to reach. Do not use any sheets or blankets that are too big for your bed. They should not hang down onto the floor. Have a firm chair that has  side arms. You can use this for support while you get dressed. Do not have throw rugs and other things on the floor that can make you trip. What can I do in the kitchen? Clean up any spills right away. Avoid walking on wet floors. Keep items that you use a lot in easy-to-reach places. If you need to reach something above you, use a strong step stool that has a grab bar. Keep electrical cords out of the way. Do not use floor polish or wax that makes floors slippery. If you must use wax, use non-skid floor wax. Do not have throw rugs and other things on the floor that can make you trip. What can I do with my stairs? Do not leave any items on the stairs. Make sure that there are handrails on both sides of the stairs and use them. Fix handrails that are broken or loose. Make sure that handrails are as long as the stairways. Check any carpeting to make sure that it is firmly attached to the stairs. Fix any carpet that is loose or worn. Avoid having throw rugs at the top or bottom of the stairs. If you do have throw rugs, attach them to the floor with carpet tape. Make sure that you have a light switch at the top of the  stairs and the bottom of the stairs. If you do not have them, ask someone to add them for you. What else can I do to help prevent falls? Wear shoes that: Do not have high heels. Have rubber bottoms. Are comfortable and fit you well. Are closed at the toe. Do not wear sandals. If you use a stepladder: Make sure that it is fully opened. Do not climb a closed stepladder. Make sure that both sides of the stepladder are locked into place. Ask someone to hold it for you, if possible. Clearly mark and make sure that you can see: Any grab bars or handrails. First and last steps. Where the edge of each step is. Use tools that help you move around (mobility aids) if they are needed. These include: Canes. Walkers. Scooters. Crutches. Turn on the lights when you go into a dark area.  Replace any light bulbs as soon as they burn out. Set up your furniture so you have a clear path. Avoid moving your furniture around. If any of your floors are uneven, fix them. If there are any pets around you, be aware of where they are. Review your medicines with your doctor. Some medicines can make you feel dizzy. This can increase your chance of falling. Ask your doctor what other things that you can do to help prevent falls. This information is not intended to replace advice given to you by your health care provider. Make sure you discuss any questions you have with your health care provider. Document Released: 03/04/2009 Document Revised: 10/14/2015 Document Reviewed: 06/12/2014 Elsevier Interactive Patient Education  2017 [employer].

## 2023-12-12 ENCOUNTER — Encounter

## 2023-12-25 DIAGNOSIS — K5900 Constipation, unspecified: Secondary | ICD-10-CM | POA: Diagnosis not present

## 2023-12-25 DIAGNOSIS — G43909 Migraine, unspecified, not intractable, without status migrainosus: Secondary | ICD-10-CM | POA: Diagnosis not present

## 2023-12-25 DIAGNOSIS — I10 Essential (primary) hypertension: Secondary | ICD-10-CM | POA: Diagnosis not present

## 2023-12-25 DIAGNOSIS — N3941 Urge incontinence: Secondary | ICD-10-CM | POA: Diagnosis not present

## 2023-12-25 DIAGNOSIS — Z8249 Family history of ischemic heart disease and other diseases of the circulatory system: Secondary | ICD-10-CM | POA: Diagnosis not present

## 2023-12-25 DIAGNOSIS — Z9181 History of falling: Secondary | ICD-10-CM | POA: Diagnosis not present

## 2023-12-25 DIAGNOSIS — Z823 Family history of stroke: Secondary | ICD-10-CM | POA: Diagnosis not present

## 2023-12-25 DIAGNOSIS — M1990 Unspecified osteoarthritis, unspecified site: Secondary | ICD-10-CM | POA: Diagnosis not present

## 2023-12-25 DIAGNOSIS — Z7982 Long term (current) use of aspirin: Secondary | ICD-10-CM | POA: Diagnosis not present

## 2023-12-25 DIAGNOSIS — J302 Other seasonal allergic rhinitis: Secondary | ICD-10-CM | POA: Diagnosis not present

## 2023-12-25 DIAGNOSIS — E039 Hypothyroidism, unspecified: Secondary | ICD-10-CM | POA: Diagnosis not present

## 2023-12-25 DIAGNOSIS — Z809 Family history of malignant neoplasm, unspecified: Secondary | ICD-10-CM | POA: Diagnosis not present

## 2024-01-16 DIAGNOSIS — H5213 Myopia, bilateral: Secondary | ICD-10-CM | POA: Diagnosis not present

## 2024-03-13 ENCOUNTER — Ambulatory Visit
Admission: RE | Admit: 2024-03-13 | Discharge: 2024-03-13 | Disposition: A | Source: Ambulatory Visit | Attending: Family Medicine | Admitting: Family Medicine

## 2024-03-13 DIAGNOSIS — L821 Other seborrheic keratosis: Secondary | ICD-10-CM | POA: Diagnosis not present

## 2024-03-13 DIAGNOSIS — D225 Melanocytic nevi of trunk: Secondary | ICD-10-CM | POA: Diagnosis not present

## 2024-03-13 DIAGNOSIS — Z1231 Encounter for screening mammogram for malignant neoplasm of breast: Secondary | ICD-10-CM | POA: Diagnosis not present

## 2024-03-13 DIAGNOSIS — D2262 Melanocytic nevi of left upper limb, including shoulder: Secondary | ICD-10-CM | POA: Diagnosis not present

## 2024-03-13 DIAGNOSIS — D2272 Melanocytic nevi of left lower limb, including hip: Secondary | ICD-10-CM | POA: Diagnosis not present

## 2024-03-13 DIAGNOSIS — D2271 Melanocytic nevi of right lower limb, including hip: Secondary | ICD-10-CM | POA: Diagnosis not present

## 2024-03-13 DIAGNOSIS — L570 Actinic keratosis: Secondary | ICD-10-CM | POA: Diagnosis not present

## 2024-03-13 DIAGNOSIS — D2261 Melanocytic nevi of right upper limb, including shoulder: Secondary | ICD-10-CM | POA: Diagnosis not present

## 2024-03-18 ENCOUNTER — Ambulatory Visit: Payer: Self-pay | Admitting: Family Medicine

## 2024-03-24 ENCOUNTER — Encounter: Payer: Self-pay | Admitting: Radiology

## 2024-04-03 ENCOUNTER — Encounter: Admitting: Physician Assistant

## 2024-04-10 ENCOUNTER — Ambulatory Visit: Admitting: Physician Assistant

## 2024-04-10 ENCOUNTER — Encounter: Payer: Self-pay | Admitting: Physician Assistant

## 2024-04-10 DIAGNOSIS — M810 Age-related osteoporosis without current pathological fracture: Secondary | ICD-10-CM

## 2024-04-10 MED ORDER — DENOSUMAB 60 MG/ML ~~LOC~~ SOSY: 60.0000 mg | PREFILLED_SYRINGE | Freq: Once | SUBCUTANEOUS | Status: AC

## 2024-04-10 NOTE — Addendum Note (Signed)
 Addended by: LYNNANN HARVEY E on: 04/10/2024 01:43 PM   Modules accepted: Orders

## 2024-04-10 NOTE — Progress Notes (Signed)
 Office Visit Note   Patient: Teresa Patterson           Date of Birth: 1940-09-05           MRN: [id number] Visit Date: 04/10/2024              Requested by: Catherine Charlies LABOR, DO [address] [place],  [place] [address] PCP: Catherine Charlies LABOR, DO  Chief Complaint  Patient presents with   osteoporosis follow up      HPI: Patient is a pleasant 83 year old woman who I have seen in the osteoporosis clinic.  She comes in today with questions as she has not heard back from anybody with regards to coverage for Prolia .  Assessment & Plan: Visit Diagnoses:  1. Age-related osteoporosis without current pathological fracture     Plan: I did review her files Evenity was too expensive for her I did forward a message to get authorization for Prolia  as she says no one ever contacted her.  I will send a message to expedite seeing if she could get Prolia  or possibly Jubbonti  Follow-Up Instructions: No follow-ups on file.   Ortho Exam  Patient is alert, oriented, no adenopathy, well-dressed, normal affect, normal respiratory effort.     Imaging: No results found. No images are attached to the encounter.  Labs: Lab Results  Component Value Date   HGBA1C 5.6 09/11/2023   HGBA1C 5.5 09/07/2022   HGBA1C 5.6 09/05/2021     Lab Results  Component Value Date   ALBUMIN 4.4 09/11/2023   ALBUMIN 3.0 (L) 10/26/2022   ALBUMIN 2.8 (L) 10/26/2022    Lab Results  Component Value Date   MG 2.1 09/13/2023   MG 1.8 10/26/2022   MG 2.0 10/25/2022   Lab Results  Component Value Date   VD25OH 45.09 09/11/2023   VD25OH 36.30 09/07/2022   VD25OH 39.51 09/05/2021    No results found for: PREALBUMIN    Latest Ref Rng & Units 09/13/2023    1:26 AM 09/12/2023    7:40 PM 09/11/2023    8:57 AM  CBC EXTENDED  WBC 4.0 - 10.5 K/uL 9.6  12.7  6.1   RBC 3.87 - 5.11 MIL/uL 3.87 - 5.11 MIL/uL 3.69    3.68  3.89  4.28   Hemoglobin 12.0 - 15.0 g/dL 88.9  88.4  87.0   HCT 36.0 - 46.0 % 33.4  35.6   38.8   Platelets 150 - 400 K/uL 152  185  202.0   NEUT# 1.7 - 7.7 K/uL  9.7    Lymph# 0.7 - 4.0 K/uL  1.9       There is no height or weight on file to calculate BMI.  Orders:  No orders of the defined types were placed in this encounter.  No orders of the defined types were placed in this encounter.    Procedures: No procedures performed  Clinical Data: No additional findings.  ROS:  All other systems negative, except as noted in the HPI. Review of Systems  Objective: Vital Signs: There were no vitals taken for this visit.  Specialty Comments:  No specialty comments available.  PMFS History: Patient Active Problem List   Diagnosis Date Noted   Anemia 09/13/2023   Cardiomegaly 09/13/2023   Nondisplaced fracture of greater trochanter of right femur, initial encounter for closed fracture (HCC) 09/13/2023   Closed intertrochanteric fracture of right femur (HCC) 10/23/2022   Migraine, chronic, without aura 09/05/2021   Osteopenia 11/06/2019  Age-related osteoporosis without current pathological fracture 07/25/2018   Edema 07/25/2018   Seasonal allergies 07/25/2018   Hypothyroidism 07/11/2016   Prominent aorta 07/11/2016   Primary osteoarthritis of first carpometacarpal joint of right hand 11/25/2015   Menopausal symptom 06/04/2007   Past Medical History:  Diagnosis Date   Colon polyp    Hypothyroid    hypothyroid   Migraine    Multiple dysplastic nevi    established with Dr. Rolan Molt dermatology   Osteopenia     Family History  Problem Relation Age of Onset   Cancer Maternal Uncle        ?   Brain cancer Brother        BRAIN TUMOR   Breast cancer Neg Hx     Past Surgical History:  Procedure Laterality Date   ABDOMINAL HYSTERECTOMY  1972   CHOLECYSTECTOMY  1995   OVARIAN CYST REMOVAL Right    right   TONSILLECTOMY AND ADENOIDECTOMY  1952   TOTAL HIP ARTHROPLASTY Right 10/24/2022   Procedure: TOTAL HIP ARTHROPLASTY ANTERIOR APPROACH;  Surgeon:  Vernetta Lonni GRADE, MD;  Location: [facility] OR;  Service: Orthopedics;  Laterality: Right;   Social History   Occupational History   Not on file  Tobacco Use   Smoking status: Never   Smokeless tobacco: Never  Vaping Use   Vaping status: Never Used  Substance and Sexual Activity   Alcohol use: No    Alcohol/week: 0.0 standard drinks of alcohol   Drug use: No   Sexual activity: Not Currently    Partners: Male

## 2024-04-22 ENCOUNTER — Telehealth: Payer: Self-pay | Admitting: Orthopedic Surgery

## 2024-04-22 NOTE — Telephone Encounter (Signed)
 Teresa Patterson is calling to follow up. At her last visit with Ronal Dragon she was told that someone would be checking on a medication for her and would call her to discuss. She has not heard anything. Her call back # is [phone].

## 2024-04-25 NOTE — Telephone Encounter (Signed)
 Called, discussed, see referral notes for further.

## 2024-05-13 ENCOUNTER — Other Ambulatory Visit (HOSPITAL_BASED_OUTPATIENT_CLINIC_OR_DEPARTMENT_OTHER)

## 2024-05-27 ENCOUNTER — Other Ambulatory Visit

## 2024-06-11 ENCOUNTER — Telehealth: Payer: Self-pay

## 2024-06-11 NOTE — Telephone Encounter (Signed)
 Copied from CRM #[id number]. Topic: Referral - Question >> Jun 11, 2024  4:27 PM Revonda D wrote: Reason for CRM: Pt is requesting a referral for an ENT specialist and would like to go to [facility] on [address]. Phone:[phone].  Did not follow reason for referral script. E2C2 agent error - submitted error report 1/21 dnh

## 2024-06-16 ENCOUNTER — Ambulatory Visit: Admitting: Family Medicine

## 2024-06-25 ENCOUNTER — Encounter: Payer: Self-pay | Admitting: Family Medicine

## 2024-06-25 ENCOUNTER — Ambulatory Visit: Admitting: Family Medicine

## 2024-06-25 VITALS — BP 116/70 | HR 69 | Temp 98.2°F | Wt 125.4 lb

## 2024-06-25 DIAGNOSIS — R490 Dysphonia: Secondary | ICD-10-CM

## 2024-06-25 MED ORDER — PANTOPRAZOLE SODIUM 40 MG PO TBEC: 40.0000 mg | DELAYED_RELEASE_TABLET | Freq: Every day | ORAL | 3 refills | Status: AC

## 2024-06-25 NOTE — Progress Notes (Unsigned)
 "      Teresa Patterson , [birth year], 84 y.o., female MRN: [id number] Patient Care Team    Relationship Specialty Notifications Start End  Catherine Charlies LABOR, DO PCP - General Family Medicine  10/23/22   Joshua Sieving, MD Consulting Physician Dermatology  03/14/18   Luis Purchase, MD Consulting Physician Gastroenterology  03/14/18   Camillo Golas, MD Consulting Physician Ophthalmology  03/14/18   Cleotilde Sewer, OD  Optometry  03/14/18   Sissy Cough, MD Consulting Physician Orthopedic Surgery  03/14/18     Chief Complaint  Patient presents with   Hoarse    6 months; pt requesting referral to ENT.      Subjective: Teresa Patterson is a 84 y.o. Pt presents for an OV with complaints of hoarseness x 6 months.  Patient is requesting referral to ENT for further evaluation.  She reports she does have a dry mouth, but uses lozenges and she is also vigilant about staying well-hydrated.  Patient does report having mild symptoms of dysphagia when swallowing, she mentions hamburger specifically.  She points to mid chest and states it feels like it gets stuck at that location.  She has had to have her dilated in the past.  She denies any neck compression symptoms.  She does have a history of hypothyroidism which has been stable on medication.  She reports she has been exposed to secondhand smoke since she was 84 years old until about 10 years ago when her husband passed away from throat cancer.  Patient has never personally smoked. She denies symptoms of heartburn or cough.  She is concerned she has throat cancer. She denies any unintentional weight loss, night sweats or fever.     06/25/2024   10:26 AM 12/05/2023    9:52 AM 11/29/2022    9:59 AM 09/07/2022    9:02 AM 01/25/2022   10:19 AM  Depression screen PHQ 2/9  Decreased Interest 0 0 0 0 0  Down, Depressed, Hopeless 0 0 0 0 0  PHQ - 2 Score 0 0 0 0 0  Altered sleeping 0 0     Tired, decreased energy 0 0     Change in appetite 0 0     Feeling  bad or failure about yourself  0 0     Trouble concentrating 0 0     Moving slowly or fidgety/restless 0 0     Suicidal thoughts 0 0     PHQ-9 Score 0 0      Difficult doing work/chores  Not difficult at all        Data saved with a previous flowsheet row definition      01/25/2022   10:20 AM 09/07/2022    9:01 AM 11/29/2022   10:01 AM 12/05/2023    9:47 AM 06/25/2024   10:25 AM  Fall Risk  Falls in the past year? 0 0 1 1 0  Was there an injury with Fall? 0  0  1  1  0  Was there an injury with Fall? - Comments   right hip injury     Fall Risk Category Calculator 0 0 3 2 0  Fall Risk Category (Retired) Low       (RETIRED) Patient Fall Risk Level Low fall risk       Patient at Risk for Falls Due to No Fall Risks No Fall Risks History of fall(s)  No Fall Risks  Fall risk Follow up Falls evaluation completed  Falls evaluation  completed Falls prevention discussed Falls evaluation completed;Education provided;Falls prevention discussed Falls evaluation completed     Data saved with a previous flowsheet row definition     Allergies[1] Social History   Social History Narrative   Marital status/children/pets: Widowed. 3 children. Lives alone.    Education/employment: GED, retired.    Safety:      -smoke alarm in the home:Yes     - wears seatbelt: Yes     - Feels safe in their surroundings: Yes   Past Medical History:  Diagnosis Date   Colon polyp    Hypothyroid    hypothyroid   Migraine    Multiple dysplastic nevi    established with Dr. Rolan Molt dermatology   Nondisplaced fracture of greater trochanter of right femur, initial encounter for closed fracture (HCC) 09/13/2023   Osteopenia    Past Surgical History:  Procedure Laterality Date   ABDOMINAL HYSTERECTOMY  1972   CHOLECYSTECTOMY  1995   OVARIAN CYST REMOVAL Right    right   TONSILLECTOMY AND ADENOIDECTOMY  1952   TOTAL HIP ARTHROPLASTY Right 10/24/2022   Procedure: TOTAL HIP ARTHROPLASTY ANTERIOR APPROACH;  Surgeon:  Vernetta Lonni GRADE, MD;  Location: [facility] OR;  Service: Orthopedics;  Laterality: Right;   Family History  Problem Relation Age of Onset   Cancer Maternal Uncle        ?   Brain cancer Brother        BRAIN TUMOR   Breast cancer Neg Hx    Allergies as of 06/25/2024   No Known Allergies      Medication List        Accurate as of June 25, 2024 11:59 PM. If you have any questions, ask your nurse or doctor.          STOP taking these medications    tiZANidine  2 MG tablet Commonly known as: ZANAFLEX  Stopped by: Charlies Bellini, DO       TAKE these medications    aspirin  81 MG chewable tablet Chew 1 tablet (81 mg total) by mouth 2 (two) times daily.   Co Q 10 100 MG Caps Take 1 capsule by mouth every evening.   conjugated estrogens  vaginal cream Commonly known as: PREMARIN  1 applicator of cream  intravaginally 1-2 weekly qhs   cyanocobalamin  1000 MCG tablet Take 1 tablet (1,000 mcg total) by mouth daily.   fluticasone  50 MCG/ACT nasal spray Commonly known as: FLONASE  Place 2 sprays into both nostrils daily.   hydrochlorothiazide  25 MG tablet Commonly known as: HYDRODIURIL  Take 1 tablet (25 mg total) by mouth daily.   levothyroxine  100 MCG tablet Commonly known as: SYNTHROID  Take 1 tablet (100 mcg total) by mouth daily.   multivitamin tablet Take 1 tablet by mouth every evening.   oxybutynin  10 MG 24 hr tablet Commonly known as: DITROPAN -XL Take 10 mg by mouth daily.   pantoprazole  40 MG tablet Commonly known as: PROTONIX  Take 1 tablet (40 mg total) by mouth at bedtime. Started by: Charlies Bellini, DO   SUMAtriptan  5 MG/ACT nasal spray Commonly known as: IMITREX  Place 1 spray (5 mg total) into the nose every 2 (two) hours as needed.        All past medical history, surgical history, allergies, family history, immunizations andmedications were updated in the EMR today and reviewed under the history and medication portions of their EMR.     Review  of Systems  Constitutional:  Negative for chills, fever, malaise/fatigue and weight loss.  HENT:  Negative for congestion, nosebleeds and sore throat.   Respiratory:  Negative for cough, shortness of breath and wheezing.   Gastrointestinal:  Negative for abdominal pain, heartburn, nausea and vomiting.  Musculoskeletal:  Negative for neck pain.  Skin:  Negative for rash.   Negative, with the exception of above mentioned in HPI   Objective:  BP 116/70   Pulse 69   Temp 98.2 F (36.8 C)   Wt 125 lb 6.4 oz (56.9 kg)   SpO2 97%   BMI 21.52 kg/m  Body mass index is 21.52 kg/m. Physical Exam Vitals and nursing note reviewed.  Constitutional:      General: She is not in acute distress.    Appearance: Normal appearance. She is not ill-appearing, toxic-appearing or diaphoretic.  HENT:     Head: Normocephalic and atraumatic.     Right Ear: Tympanic membrane, ear canal and external ear normal.     Left Ear: Tympanic membrane, ear canal and external ear normal.     Nose: No congestion or rhinorrhea.     Mouth/Throat:     Mouth: Mucous membranes are dry.     Pharynx: No oropharyngeal exudate or posterior oropharyngeal erythema.  Eyes:     General: No scleral icterus.       Right eye: No discharge.        Left eye: No discharge.     Extraocular Movements: Extraocular movements intact.     Conjunctiva/sclera: Conjunctivae normal.     Pupils: Pupils are equal, round, and reactive to light.  Neck:     Comments: No thyromegaly or nodules palpated Cardiovascular:     Rate and Rhythm: Normal rate and regular rhythm.     Heart sounds: No murmur heard. Pulmonary:     Effort: Pulmonary effort is normal. No respiratory distress.     Breath sounds: Normal breath sounds. No wheezing, rhonchi or rales.     Comments: Patient is clearing her throat frequently  Abdominal:     General: Abdomen is flat. Bowel sounds are normal. There is no distension.     Palpations: Abdomen is soft.      Tenderness: There is no abdominal tenderness.  Musculoskeletal:     Cervical back: Neck supple. No tenderness.     Right lower leg: No edema.     Left lower leg: No edema.  Lymphadenopathy:     Cervical: No cervical adenopathy.  Skin:    General: Skin is warm.     Findings: No rash.  Neurological:     Mental Status: She is alert and oriented to person, place, and time. Mental status is at baseline.     Motor: No weakness.     Gait: Gait normal.  Psychiatric:        Mood and Affect: Mood normal.        Behavior: Behavior normal.        Thought Content: Thought content normal.        Judgment: Judgment normal.      No results found. No results found. No results found for this or any previous visit (from the past 24 hours).  Assessment/Plan: Teresa Patterson is a 84 y.o. female present for OV for  Hoarseness of voice (Primary) No obvious reason for change in her voice over the last 6 months on exam.  She does have a clearing of her throat/tickle cough on exam today.  She has a history of esophageal stenosis requiring dilatation in the past.  She denies  any history or symptoms of GERD/heartburn today.  She does not exhibit any signs of sinus drainage.  She does have a mildly dry throat. Encouraged her to continue to be diligent on adequate hydration. Consider Biotene lozenges to help with dry throat. Would recommend she start a proton pump inhibitor to rule out LPR as cause of her hoarseness, especially with her history of esophageal stenosis requiring dilatation. Start Protonix  40 mg prior to bed.  Patient understands if symptoms are improving over the next 4 weeks, she should continue this medicine for at least 3 months. - Ambulatory referral to ENT-Dr. Roark requested  Reviewed expectations re: course of current medical issues. Discussed self-management of symptoms. Outlined signs and symptoms indicating need for more acute intervention. Patient verbalized understanding and all  questions were answered. Patient received an After-Visit Summary.    Orders Placed This Encounter  Procedures   Ambulatory referral to ENT   Meds ordered this encounter  Medications   pantoprazole  (PROTONIX ) 40 MG tablet    Sig: Take 1 tablet (40 mg total) by mouth at bedtime.    Dispense:  30 tablet    Refill:  3   Referral Orders         Ambulatory referral to ENT       Note is dictated utilizing voice recognition software. Although note has been proof read prior to signing, occasional typographical errors still can be missed. If any questions arise, please do not hesitate to call for verification.   electronically signed by:  Charlies Bellini, DO  [facility] [place]       [1] No Known Allergies  "

## 2024-08-14 ENCOUNTER — Other Ambulatory Visit (HOSPITAL_BASED_OUTPATIENT_CLINIC_OR_DEPARTMENT_OTHER)

## 2024-12-10 ENCOUNTER — Encounter
